# Patient Record
Sex: Male | Born: 1970
Health system: Southern US, Community
[De-identification: ages and names within clinical notes are randomized; demographics above are authoritative.]

## PROBLEM LIST (undated history)

## (undated) DIAGNOSIS — F909 Attention-deficit hyperactivity disorder, unspecified type: Secondary | ICD-10-CM

## (undated) DIAGNOSIS — I10 Essential (primary) hypertension: Secondary | ICD-10-CM

## (undated) DIAGNOSIS — K449 Diaphragmatic hernia without obstruction or gangrene: Secondary | ICD-10-CM

## (undated) DIAGNOSIS — Q5310 Unspecified undescended testicle, unilateral: Secondary | ICD-10-CM

## (undated) DIAGNOSIS — K219 Gastro-esophageal reflux disease without esophagitis: Secondary | ICD-10-CM

## (undated) DIAGNOSIS — G4730 Sleep apnea, unspecified: Secondary | ICD-10-CM

## (undated) DIAGNOSIS — D751 Secondary polycythemia: Secondary | ICD-10-CM

## (undated) DIAGNOSIS — K635 Polyp of colon: Secondary | ICD-10-CM

## (undated) DIAGNOSIS — E349 Endocrine disorder, unspecified: Secondary | ICD-10-CM

## (undated) DIAGNOSIS — D72829 Elevated white blood cell count, unspecified: Secondary | ICD-10-CM

## (undated) DIAGNOSIS — M1990 Unspecified osteoarthritis, unspecified site: Secondary | ICD-10-CM

## (undated) DIAGNOSIS — E669 Obesity, unspecified: Secondary | ICD-10-CM

## (undated) DIAGNOSIS — K2270 Barrett's esophagus without dysplasia: Secondary | ICD-10-CM

## (undated) HISTORY — DX: Barrett's esophagus without dysplasia: K22.70

## (undated) HISTORY — PX: APPENDECTOMY: SHX54

## (undated) HISTORY — PX: JOINT REPLACEMENT: SHX530

## (undated) HISTORY — DX: Obesity, unspecified: E66.9

## (undated) HISTORY — PX: BACK SURGERY: SHX140

## (undated) HISTORY — PX: ROTATOR CUFF REPAIR: SHX139

## (undated) HISTORY — DX: Polyp of colon: K63.5

## (undated) HISTORY — DX: Elevated white blood cell count, unspecified: D72.829

## (undated) HISTORY — DX: Unspecified undescended testicle, unilateral: Q53.10

## (undated) HISTORY — DX: Unspecified osteoarthritis, unspecified site: M19.90

## (undated) HISTORY — DX: Diaphragmatic hernia without obstruction or gangrene: K44.9

## (undated) HISTORY — PX: KNEE SURGERY: SHX244

## (undated) HISTORY — DX: Endocrine disorder, unspecified: E34.9

## (undated) HISTORY — DX: Secondary polycythemia: D75.1

## (undated) HISTORY — PX: CHOLECYSTECTOMY: SHX55

## (undated) HISTORY — PX: EYE SURGERY: SHX253

---

## 2004-04-13 ENCOUNTER — Ambulatory Visit: Payer: Self-pay | Admitting: Internal Medicine

## 2004-05-14 ENCOUNTER — Ambulatory Visit: Payer: Self-pay | Admitting: Internal Medicine

## 2004-06-13 ENCOUNTER — Ambulatory Visit: Payer: Self-pay | Admitting: Internal Medicine

## 2004-07-14 ENCOUNTER — Ambulatory Visit: Payer: Self-pay | Admitting: Internal Medicine

## 2004-08-23 ENCOUNTER — Ambulatory Visit: Payer: Self-pay | Admitting: Internal Medicine

## 2004-09-11 ENCOUNTER — Ambulatory Visit: Payer: Self-pay | Admitting: Internal Medicine

## 2004-10-18 ENCOUNTER — Ambulatory Visit: Payer: Self-pay | Admitting: Internal Medicine

## 2004-11-11 ENCOUNTER — Ambulatory Visit: Payer: Self-pay | Admitting: Internal Medicine

## 2004-12-12 ENCOUNTER — Ambulatory Visit: Payer: Self-pay | Admitting: Internal Medicine

## 2005-01-11 ENCOUNTER — Ambulatory Visit: Payer: Self-pay | Admitting: Internal Medicine

## 2005-02-11 ENCOUNTER — Ambulatory Visit: Payer: Self-pay | Admitting: Internal Medicine

## 2005-03-14 ENCOUNTER — Ambulatory Visit: Payer: Self-pay | Admitting: Internal Medicine

## 2005-04-18 ENCOUNTER — Ambulatory Visit: Payer: Self-pay | Admitting: Internal Medicine

## 2005-05-14 ENCOUNTER — Ambulatory Visit: Payer: Self-pay | Admitting: Internal Medicine

## 2005-06-20 ENCOUNTER — Ambulatory Visit: Payer: Self-pay | Admitting: Internal Medicine

## 2005-07-14 ENCOUNTER — Ambulatory Visit: Payer: Self-pay | Admitting: Internal Medicine

## 2005-08-21 ENCOUNTER — Ambulatory Visit: Payer: Self-pay | Admitting: Internal Medicine

## 2005-09-11 ENCOUNTER — Ambulatory Visit: Payer: Self-pay | Admitting: Internal Medicine

## 2005-10-12 ENCOUNTER — Ambulatory Visit: Payer: Self-pay | Admitting: Internal Medicine

## 2005-11-11 ENCOUNTER — Ambulatory Visit: Payer: Self-pay | Admitting: Internal Medicine

## 2005-12-15 ENCOUNTER — Ambulatory Visit: Payer: Self-pay | Admitting: General Practice

## 2005-12-22 ENCOUNTER — Ambulatory Visit: Payer: Self-pay | Admitting: Internal Medicine

## 2006-01-02 ENCOUNTER — Ambulatory Visit: Payer: Self-pay | Admitting: Unknown Physician Specialty

## 2006-01-11 ENCOUNTER — Ambulatory Visit: Payer: Self-pay | Admitting: Internal Medicine

## 2006-03-13 ENCOUNTER — Ambulatory Visit: Payer: Self-pay | Admitting: Internal Medicine

## 2006-03-14 ENCOUNTER — Ambulatory Visit: Payer: Self-pay | Admitting: Internal Medicine

## 2006-04-13 ENCOUNTER — Ambulatory Visit: Payer: Self-pay | Admitting: Internal Medicine

## 2006-07-16 ENCOUNTER — Ambulatory Visit: Payer: Self-pay | Admitting: Internal Medicine

## 2006-08-14 ENCOUNTER — Ambulatory Visit: Payer: Self-pay | Admitting: Internal Medicine

## 2006-09-12 ENCOUNTER — Ambulatory Visit: Payer: Self-pay | Admitting: Internal Medicine

## 2006-12-14 ENCOUNTER — Ambulatory Visit: Payer: Self-pay | Admitting: Internal Medicine

## 2007-01-12 ENCOUNTER — Ambulatory Visit: Payer: Self-pay | Admitting: Internal Medicine

## 2007-04-21 ENCOUNTER — Ambulatory Visit: Payer: Self-pay

## 2007-07-20 ENCOUNTER — Ambulatory Visit: Payer: Self-pay | Admitting: Internal Medicine

## 2007-08-15 ENCOUNTER — Ambulatory Visit: Payer: Self-pay | Admitting: Internal Medicine

## 2007-11-23 ENCOUNTER — Ambulatory Visit: Payer: Self-pay | Admitting: Oncology

## 2007-12-13 ENCOUNTER — Ambulatory Visit: Payer: Self-pay | Admitting: Oncology

## 2008-12-08 ENCOUNTER — Ambulatory Visit: Payer: Self-pay | Admitting: Internal Medicine

## 2008-12-12 ENCOUNTER — Ambulatory Visit: Payer: Self-pay | Admitting: Internal Medicine

## 2009-08-06 ENCOUNTER — Ambulatory Visit: Payer: Self-pay | Admitting: General Practice

## 2009-08-13 ENCOUNTER — Ambulatory Visit: Payer: Self-pay | Admitting: General Practice

## 2010-04-02 ENCOUNTER — Ambulatory Visit: Payer: Self-pay | Admitting: Internal Medicine

## 2010-04-12 ENCOUNTER — Ambulatory Visit: Payer: Self-pay | Admitting: Internal Medicine

## 2010-04-13 ENCOUNTER — Ambulatory Visit: Payer: Self-pay | Admitting: Internal Medicine

## 2010-05-23 ENCOUNTER — Ambulatory Visit: Payer: Self-pay | Admitting: General Practice

## 2010-06-13 HISTORY — PX: COLONOSCOPY: SHX174

## 2010-06-28 ENCOUNTER — Ambulatory Visit: Payer: Self-pay | Admitting: Unknown Physician Specialty

## 2010-07-01 LAB — PATHOLOGY REPORT

## 2010-09-08 ENCOUNTER — Emergency Department: Payer: Self-pay | Admitting: Unknown Physician Specialty

## 2010-09-12 ENCOUNTER — Ambulatory Visit: Payer: Self-pay | Admitting: General Practice

## 2011-02-10 ENCOUNTER — Ambulatory Visit: Payer: Self-pay | Admitting: Internal Medicine

## 2011-02-10 ENCOUNTER — Ambulatory Visit: Admit: 2011-02-10 | Disposition: A | Discharge status: Home / Self Care | Payer: Self-pay | Admitting: Internal Medicine

## 2011-02-11 ENCOUNTER — Ambulatory Visit: Payer: Self-pay | Admitting: General Practice

## 2011-02-12 ENCOUNTER — Ambulatory Visit: Payer: Self-pay | Admitting: Internal Medicine

## 2011-02-12 ENCOUNTER — Ambulatory Visit: Payer: Self-pay | Admitting: General Practice

## 2011-03-15 ENCOUNTER — Ambulatory Visit: Payer: Self-pay | Admitting: General Practice

## 2011-04-29 ENCOUNTER — Ambulatory Visit: Payer: Self-pay | Admitting: General Practice

## 2011-04-30 ENCOUNTER — Ambulatory Visit: Payer: Self-pay | Admitting: Internal Medicine

## 2011-05-15 ENCOUNTER — Ambulatory Visit: Payer: Self-pay | Admitting: General Practice

## 2011-05-15 ENCOUNTER — Ambulatory Visit: Payer: Self-pay | Admitting: Internal Medicine

## 2011-06-14 ENCOUNTER — Ambulatory Visit: Payer: Self-pay | Admitting: Internal Medicine

## 2011-11-24 ENCOUNTER — Ambulatory Visit: Payer: Self-pay | Admitting: Internal Medicine

## 2011-11-24 LAB — CBC CANCER CENTER
Basophil #: 0.1 x10 3/mm (ref 0.0–0.1)
Eosinophil #: 0.2 x10 3/mm (ref 0.0–0.7)
HGB: 13.6 g/dL (ref 13.0–18.0)
Lymphocyte #: 3.1 x10 3/mm (ref 1.0–3.6)
MCH: 23 pg — ABNORMAL LOW (ref 26.0–34.0)
MCHC: 31.2 g/dL — ABNORMAL LOW (ref 32.0–36.0)
MCV: 74 fL — ABNORMAL LOW (ref 80–100)
Monocyte #: 1.2 x10 3/mm — ABNORMAL HIGH (ref 0.2–1.0)
RBC: 5.92 10*6/uL — ABNORMAL HIGH (ref 4.40–5.90)
WBC: 14.8 x10 3/mm — ABNORMAL HIGH (ref 3.8–10.6)

## 2011-12-13 ENCOUNTER — Ambulatory Visit: Payer: Self-pay | Admitting: Internal Medicine

## 2012-01-26 ENCOUNTER — Ambulatory Visit: Payer: Self-pay | Admitting: Internal Medicine

## 2012-01-26 LAB — CBC CANCER CENTER
Basophil #: 0 x10 3/mm (ref 0.0–0.1)
Eosinophil #: 0.2 x10 3/mm (ref 0.0–0.7)
Eosinophil %: 1 %
HCT: 46.3 % (ref 40.0–52.0)
Lymphocyte %: 16.8 %
MCH: 21.9 pg — ABNORMAL LOW (ref 26.0–34.0)
MCHC: 30.3 g/dL — ABNORMAL LOW (ref 32.0–36.0)
MCV: 72 fL — ABNORMAL LOW (ref 80–100)
Neutrophil #: 11.9 x10 3/mm — ABNORMAL HIGH (ref 1.4–6.5)
Neutrophil %: 75.1 %
Platelet: 353 x10 3/mm (ref 150–440)
RBC: 6.41 10*6/uL — ABNORMAL HIGH (ref 4.40–5.90)
RDW: 18.5 % — ABNORMAL HIGH (ref 11.5–14.5)
WBC: 15.8 x10 3/mm — ABNORMAL HIGH (ref 3.8–10.6)

## 2012-02-12 ENCOUNTER — Ambulatory Visit: Payer: Self-pay | Admitting: Internal Medicine

## 2012-03-30 ENCOUNTER — Ambulatory Visit: Payer: Self-pay | Admitting: General Practice

## 2012-05-24 ENCOUNTER — Ambulatory Visit: Payer: Self-pay | Admitting: Specialist

## 2012-05-28 ENCOUNTER — Ambulatory Visit: Payer: Self-pay | Admitting: Specialist

## 2012-06-13 ENCOUNTER — Ambulatory Visit: Payer: Self-pay | Admitting: Specialist

## 2012-06-28 ENCOUNTER — Ambulatory Visit: Payer: Self-pay | Admitting: General Surgery

## 2012-06-29 LAB — PATHOLOGY REPORT

## 2012-10-12 ENCOUNTER — Ambulatory Visit: Payer: Self-pay | Admitting: Internal Medicine

## 2012-11-08 ENCOUNTER — Emergency Department: Payer: Self-pay | Admitting: Emergency Medicine

## 2012-11-11 ENCOUNTER — Ambulatory Visit: Payer: Self-pay | Admitting: Internal Medicine

## 2012-12-02 ENCOUNTER — Ambulatory Visit: Payer: Self-pay | Admitting: General Practice

## 2012-12-22 DIAGNOSIS — M549 Dorsalgia, unspecified: Secondary | ICD-10-CM | POA: Insufficient documentation

## 2013-04-15 ENCOUNTER — Other Ambulatory Visit: Payer: Self-pay | Admitting: General Practice

## 2013-04-15 DIAGNOSIS — M542 Cervicalgia: Secondary | ICD-10-CM

## 2013-04-15 DIAGNOSIS — R52 Pain, unspecified: Secondary | ICD-10-CM

## 2013-04-21 ENCOUNTER — Ambulatory Visit
Admission: RE | Admit: 2013-04-21 | Discharge: 2013-04-21 | Disposition: A | Discharge status: Home / Self Care | Payer: BC Managed Care – PPO | Source: Ambulatory Visit | Attending: General Practice | Admitting: General Practice

## 2013-04-21 DIAGNOSIS — M542 Cervicalgia: Secondary | ICD-10-CM

## 2013-04-21 DIAGNOSIS — R52 Pain, unspecified: Secondary | ICD-10-CM

## 2013-04-22 ENCOUNTER — Ambulatory Visit: Payer: Self-pay | Admitting: General Practice

## 2013-04-24 ENCOUNTER — Other Ambulatory Visit: Payer: Self-pay

## 2013-04-25 ENCOUNTER — Other Ambulatory Visit: Payer: Self-pay

## 2013-08-31 ENCOUNTER — Other Ambulatory Visit: Payer: Self-pay | Admitting: Neurosurgery

## 2013-09-02 ENCOUNTER — Encounter (HOSPITAL_COMMUNITY): Payer: Self-pay | Admitting: Pharmacy Technician

## 2013-09-07 NOTE — Pre-Procedure Instructions (Signed)
Jeffrey Carlson  09/07/2013   Your procedure is scheduled on:  Tuesday September 13, 2013 at 12:00 PM.  Report to [place] Stay Entrance "A"  Admitting at 10:00 AM.  Call this number if you have problems the morning of surgery: [phone]   Remember:   Do not eat food or drink liquids after midnight.   Take these medicines the morning of surgery with A SIP OF WATER:  Esomeprazole (Nexium) Stop taking Aspirin and herbal medications. Do not take any NSAIDs ie: Ibuprofen, Advil, Naproxen or any medication containing Aspirin.  Do not wear jewelry.  Do not wear lotions, powders, or colognes. You may wear deodorant.  Men may shave face and neck.  Do not bring valuables to the hospital.  [facility] is not responsible for any belongings or valuables.               Contacts, dentures or bridgework may not be worn into surgery.  Leave suitcase in the car. After surgery it may be brought to your room.  For patients admitted to the hospital, discharge time is determined by your treatment team.               Patients discharged the day of surgery will not be allowed to drive home.  Name and phone number of your driver: Family/Friend  Special Instructions: Shower using CHG soap the night before and the morning of your surgery   Please read over the following fact sheets that you were given: Pain Booklet, Coughing and Deep Breathing, MRSA Information and Surgical Site Infection Prevention

## 2013-09-08 ENCOUNTER — Inpatient Hospital Stay (HOSPITAL_COMMUNITY)
Admission: RE | Admit: 2013-09-08 | Discharge: 2013-09-08 | Disposition: A | Discharge status: Home / Self Care | Payer: BC Managed Care – PPO | Source: Ambulatory Visit

## 2013-09-08 NOTE — Pre-Procedure Instructions (Signed)
Jeffrey Carlson  09/08/2013   Your procedure is scheduled on:  Tuesday September 13, 2013 at 12:00 PM.  Report to [place] Stay Entrance "A"  Admitting at 10:00 AM.  Call this number if you have problems the morning of surgery: [phone]   Remember:   Do not eat food or drink liquids after midnight.   Take these medicines the morning of surgery with A SIP OF WATER: amphetamine-dextroamphetamine (ADDERALL XR) 20 MG 24 hr capsule,  Esomeprazole (Nexium) Stop taking Aspirin and herbal medications. Do not take any NSAIDs ie: Ibuprofen, Advil, Naproxen or any medication containing Aspirin.  Do not wear jewelry.  Do not wear lotions, powders, or colognes. You may wear deodorant.  Men may shave face and neck.  Do not bring valuables to the hospital.  [facility] is not responsible for any belongings or valuables.               Contacts, dentures or bridgework may not be worn into surgery.  Leave suitcase in the car. After surgery it may be brought to your room.  For patients admitted to the hospital, discharge time is determined by your treatment team.               Patients discharged the day of surgery will not be allowed to drive home.  Name and phone number of your driver: Family/Friend  Special Instructions: Shower using CHG soap the night before and the morning of your surgery   Please read over the following fact sheets that you were given: Pain Booklet, Coughing and Deep Breathing, MRSA Information and Surgical Site Infection Prevention

## 2013-09-09 ENCOUNTER — Encounter (HOSPITAL_COMMUNITY)
Admission: RE | Admit: 2013-09-09 | Discharge: 2013-09-09 | Disposition: A | Discharge status: Home / Self Care | Payer: BC Managed Care – PPO | Source: Ambulatory Visit | Attending: Neurosurgery | Admitting: Neurosurgery

## 2013-09-09 ENCOUNTER — Encounter (HOSPITAL_COMMUNITY): Payer: Self-pay

## 2013-09-09 DIAGNOSIS — Z01812 Encounter for preprocedural laboratory examination: Secondary | ICD-10-CM | POA: Insufficient documentation

## 2013-09-09 DIAGNOSIS — Z01810 Encounter for preprocedural cardiovascular examination: Secondary | ICD-10-CM | POA: Insufficient documentation

## 2013-09-09 DIAGNOSIS — Z01818 Encounter for other preprocedural examination: Secondary | ICD-10-CM | POA: Insufficient documentation

## 2013-09-09 HISTORY — DX: Essential (primary) hypertension: I10

## 2013-09-09 HISTORY — DX: Attention-deficit hyperactivity disorder, unspecified type: F90.9

## 2013-09-09 HISTORY — DX: Sleep apnea, unspecified: G47.30

## 2013-09-09 HISTORY — DX: Gastro-esophageal reflux disease without esophagitis: K21.9

## 2013-09-09 LAB — CBC
HCT: 46.7 % (ref 39.0–52.0)
Hemoglobin: 15.6 g/dL (ref 13.0–17.0)
MCH: 26.9 pg (ref 26.0–34.0)
MCHC: 33.4 g/dL (ref 30.0–36.0)
MCV: 80.4 fL (ref 78.0–100.0)
PLATELETS: 293 10*3/uL (ref 150–400)
RBC: 5.81 MIL/uL (ref 4.22–5.81)
RDW: 17.9 % — AB (ref 11.5–15.5)
WBC: 11.6 10*3/uL — ABNORMAL HIGH (ref 4.0–10.5)

## 2013-09-09 LAB — BASIC METABOLIC PANEL
BUN: 13 mg/dL (ref 6–23)
CALCIUM: 9.7 mg/dL (ref 8.4–10.5)
CO2: 26 mEq/L (ref 19–32)
CREATININE: 0.96 mg/dL (ref 0.50–1.35)
Chloride: 103 mEq/L (ref 96–112)
GLUCOSE: 89 mg/dL (ref 70–99)
POTASSIUM: 4.8 meq/L (ref 3.7–5.3)
Sodium: 140 mEq/L (ref 137–147)

## 2013-09-09 LAB — SURGICAL PCR SCREEN
MRSA, PCR: NEGATIVE
Staphylococcus aureus: NEGATIVE

## 2013-09-13 ENCOUNTER — Encounter (HOSPITAL_COMMUNITY): Admission: RE | Payer: Self-pay | Source: Ambulatory Visit

## 2013-09-13 ENCOUNTER — Ambulatory Visit (HOSPITAL_COMMUNITY): Admission: RE | Admit: 2013-09-13 | Payer: BC Managed Care – PPO | Source: Ambulatory Visit | Admitting: Neurosurgery

## 2013-09-13 ENCOUNTER — Ambulatory Visit: Payer: Self-pay | Admitting: General Practice

## 2013-09-13 SURGERY — LUMBAR LAMINECTOMY/DECOMPRESSION MICRODISCECTOMY 2 LEVELS
Anesthesia: General | Site: Back

## 2013-10-10 DIAGNOSIS — M75100 Unspecified rotator cuff tear or rupture of unspecified shoulder, not specified as traumatic: Secondary | ICD-10-CM | POA: Insufficient documentation

## 2013-10-10 DIAGNOSIS — M67919 Unspecified disorder of synovium and tendon, unspecified shoulder: Secondary | ICD-10-CM

## 2013-12-13 ENCOUNTER — Ambulatory Visit (INDEPENDENT_AMBULATORY_CARE_PROVIDER_SITE_OTHER): Payer: BC Managed Care – PPO

## 2013-12-13 ENCOUNTER — Encounter: Payer: Self-pay | Admitting: Podiatry

## 2013-12-13 ENCOUNTER — Ambulatory Visit (INDEPENDENT_AMBULATORY_CARE_PROVIDER_SITE_OTHER): Payer: BC Managed Care – PPO | Admitting: Podiatry

## 2013-12-13 VITALS — BP 118/74 | HR 59 | Resp 16 | Ht 73.0 in | Wt 305.0 lb

## 2013-12-13 DIAGNOSIS — M722 Plantar fascial fibromatosis: Secondary | ICD-10-CM

## 2013-12-14 NOTE — Progress Notes (Signed)
Subjective:     Patient ID: Jeffrey Carlson, male   DOB: February 03, 1971, 43 y.o.   MRN: [id number]  HPI patient presents stating that he has lost a lot of weight and needs to be active in his feet hurt and the orthotics are starting to fall apart   Review of Systems     Objective:   Physical Exam Neurovascular status intact with mild discomfort plantar fascia right with moderate depression of the arch on weightbearing.    Assessment:     Fasciitis secondary to foot structural and still carries quite a bit of excessive weight    Plan:     Scanned for new custom orthotic devices and reviewed applying new top covers to existing when we receive the new devices

## 2013-12-22 ENCOUNTER — Encounter: Payer: Self-pay | Admitting: Podiatry

## 2013-12-29 ENCOUNTER — Ambulatory Visit: Payer: Self-pay | Admitting: Unknown Physician Specialty

## 2013-12-29 DIAGNOSIS — M79609 Pain in unspecified limb: Secondary | ICD-10-CM

## 2013-12-30 DIAGNOSIS — M722 Plantar fascial fibromatosis: Secondary | ICD-10-CM

## 2014-01-02 LAB — PATHOLOGY REPORT

## 2014-01-16 ENCOUNTER — Encounter: Payer: Self-pay | Admitting: Podiatry

## 2014-06-23 ENCOUNTER — Ambulatory Visit: Payer: Self-pay | Admitting: General Practice

## 2014-10-31 NOTE — Op Note (Signed)
PATIENT NAME:  Jeffrey Carlson, Jeffrey Carlson MR#:  [id number] DATE OF BIRTH:  1970-08-25  DATE OF PROCEDURE:  06/28/2012  PREOPERATIVE DIAGNOSIS:  Chronic cholecystitis.   POSTOPERATIVE DIAGNOSIS:  Chronic cholecystitis.   OPERATIVE PROCEDURE:  Laparoscopic cholecystectomy with intraoperative cholangiograms.   OPERATING SURGEON:  Hervey Ard, MD   ANESTHESIA:  Elyse Hsu, MD   ESTIMATED BLOOD LOSS:  Less than 3 mL.   CLINICAL NOTE:  This 44 year old male has lost 50 pounds in preparation for bariatric surgery. Ultrasound shows evidence of sludge. No stones were identified. He has had recent symptoms of postprandial discomfort after spicy [place] food. His bariatric surgeon requested elective cholecystectomy prior to planned gastric bypass.   OPERATIVE NOTE:  With the patient under adequate general endotracheal anesthesia, the abdomen was prepped with ChloraPrep and draped. Hair had previously been removed with clippers. He had DVT prevention with pneumatic compression stockings. The patient was placed in Trendelenburg position and a Veress needle placed through a transumbilical incision. After assuring intra-abdominal location with the hanging drop test, the abdomen was insufflated with CO2 at initially 12 and then raised to 15 mmHg. Inspection showed no evidence of injury after expansion of a 10 mm step port. The patient was placed into the reverse Trendelenburg position and rolled to the left. An 11 mm Xcel port was placed in the epigastrium and two 5 mm step ports placed laterally. The gallbladder showed duskiness and thickening near the neck of the gallbladder. It was placed on cephalad traction. The cystic duct was identified. Fluoroscopic cholangiograms were completed using 15 mL of one-half strength Conray 60. This showed very small ducts. There was no evidence of retained stone or other ductal abnormality. The cystic duct and cystic arteries were doubly clipped and divided adjacent to the gallbladder.  The gallbladder was then removed from the liver bed making use of hook cautery dissection. It was delivered to the umbilical port site. Inspection from the epigastric site showed no evidence of injury from initial port placement. After reestablishing pneumoperitoneum, inspection of the right upper quadrant showed it to be dry. The abdomen was then desufflated under direct vision and the ports were removed. The fascia at the umbilicus was closed with a single 0 Vicryl figure-of-eight suture. Skin incisions were closed with 4-0 Vicryl subcuticular sutures. Benzoin, Steri-Strips, Telfa and Tegaderm dressings were applied.   The patient tolerated the procedure well and was taken to the recovery room in stable condition.     ____________________________ Robert Bellow, MD jwb:es D: 06/28/2012 12:28:03 ET T: 06/29/2012 11:14:55 ET JOB#: [id number]  cc: Robert Bellow, MD, <Dictator> Nelda Severe. Burt Ek, MD Kreg Shropshire, MD Vincente Asbridge Amedeo Kinsman MD ELECTRONICALLY SIGNED 06/29/2012 13:54

## 2014-12-20 ENCOUNTER — Inpatient Hospital Stay: Payer: BLUE CROSS/BLUE SHIELD

## 2014-12-20 ENCOUNTER — Inpatient Hospital Stay: Payer: BLUE CROSS/BLUE SHIELD | Attending: Family Medicine | Admitting: Family Medicine

## 2014-12-20 ENCOUNTER — Ambulatory Visit: Payer: BLUE CROSS/BLUE SHIELD

## 2014-12-20 ENCOUNTER — Encounter (INDEPENDENT_AMBULATORY_CARE_PROVIDER_SITE_OTHER): Payer: Self-pay

## 2014-12-20 ENCOUNTER — Encounter: Payer: Self-pay | Admitting: Family Medicine

## 2014-12-20 ENCOUNTER — Other Ambulatory Visit: Payer: Self-pay | Admitting: Family Medicine

## 2014-12-20 VITALS — BP 116/80 | HR 67 | Temp 97.4°F | Resp 16 | Wt 339.5 lb

## 2014-12-20 DIAGNOSIS — D751 Secondary polycythemia: Secondary | ICD-10-CM | POA: Insufficient documentation

## 2014-12-20 DIAGNOSIS — K219 Gastro-esophageal reflux disease without esophagitis: Secondary | ICD-10-CM | POA: Insufficient documentation

## 2014-12-20 DIAGNOSIS — Z79899 Other long term (current) drug therapy: Secondary | ICD-10-CM | POA: Insufficient documentation

## 2014-12-20 DIAGNOSIS — G4730 Sleep apnea, unspecified: Secondary | ICD-10-CM | POA: Diagnosis not present

## 2014-12-20 DIAGNOSIS — Z9049 Acquired absence of other specified parts of digestive tract: Secondary | ICD-10-CM | POA: Diagnosis not present

## 2014-12-20 DIAGNOSIS — D45 Polycythemia vera: Secondary | ICD-10-CM | POA: Diagnosis not present

## 2014-12-20 DIAGNOSIS — R531 Weakness: Secondary | ICD-10-CM | POA: Diagnosis not present

## 2014-12-20 DIAGNOSIS — F909 Attention-deficit hyperactivity disorder, unspecified type: Secondary | ICD-10-CM | POA: Insufficient documentation

## 2014-12-20 DIAGNOSIS — R5383 Other fatigue: Secondary | ICD-10-CM | POA: Insufficient documentation

## 2014-12-20 DIAGNOSIS — I10 Essential (primary) hypertension: Secondary | ICD-10-CM | POA: Diagnosis not present

## 2014-12-20 HISTORY — DX: Secondary polycythemia: D75.1

## 2014-12-20 LAB — HEMATOCRIT: HCT: 50.1 % (ref 40.0–52.0)

## 2014-12-20 NOTE — Progress Notes (Signed)
[place]  Telephone:([phone]) [phone]  Fax:([phone]     Jeffrey Carlson DOB: 1970/09/10  MR#: [id number]  EXH#:[id number]  Patient Care Team: Shepard General, MD as PCP - General (General Practice)  CHIEF COMPLAINT:  Chief Complaint  Patient presents with  . Follow-up    States that he's back again for a " follow up."    INTERVAL HISTORY:  Patient is here for continued follow-up and further evaluation polycythemia vera.. He has been lost to follow-up since 2014. Was a previous patient of Dr. Inez Pilgrim. He reports that he did not return for follow-up due to feeling well and he has recently started feeling poorly again, including fatigue.  REVIEW OF SYSTEMS:   Review of Systems  Constitutional: Positive for malaise/fatigue. Negative for fever, chills, weight loss and diaphoresis.  HENT: Negative for congestion, ear discharge, ear pain, hearing loss, nosebleeds, sore throat and tinnitus.   Eyes: Negative for blurred vision, double vision, photophobia, pain, discharge and redness.  Respiratory: Negative for cough, hemoptysis, sputum production, shortness of breath, wheezing and stridor.   Cardiovascular: Negative for chest pain, palpitations, orthopnea, claudication, leg swelling and PND.  Gastrointestinal: Negative for heartburn, nausea, vomiting, abdominal pain, diarrhea, constipation, blood in stool and melena.  Genitourinary: Negative.   Musculoskeletal: Negative.   Skin: Negative.   Neurological: Negative for dizziness, tingling, focal weakness, seizures, weakness and headaches.  Endo/Heme/Allergies: Does not bruise/bleed easily.  Psychiatric/Behavioral: Negative for depression. The patient is not nervous/anxious and does not have insomnia.     As per HPI. Otherwise, a complete review of systems is negatve.  ONCOLOGY HISTORY:  No history exists.    PAST MEDICAL HISTORY: Past Medical History  Diagnosis Date  . GERD (gastroesophageal reflux disease)   .  Adult ADHD   . Hypertension     dr Burt Ek   [place]  . Sleep apnea     cpap  . Polycythemia, secondary 12/20/2014    PAST SURGICAL HISTORY: Past Surgical History  Procedure Laterality Date  . Joint replacement      rt shoulder  . Appendectomy    . Eye surgery    . Cholecystectomy      FAMILY HISTORY No family history on file.  GYNECOLOGIC HISTORY:  No LMP for male patient.     ADVANCED DIRECTIVES:    HEALTH MAINTENANCE: History  Substance Use Topics  . Smoking status: Never Smoker   . Smokeless tobacco: Not on file  . Alcohol Use: Yes     Comment: occ     Colonoscopy:  PAP:  Bone density:  Lipid panel:  No Known Allergies  Current Outpatient Prescriptions  Medication Sig Dispense Refill  . amphetamine-dextroamphetamine (ADDERALL XR) 20 MG 24 hr capsule Take 20 mg by mouth daily.    [name] esomeprazole (NEXIUM) 40 MG capsule Take 40 mg by mouth daily at 12 noon.    [name] lisinopril (PRINIVIL,ZESTRIL) 20 MG tablet Take 20 mg by mouth daily.    . meloxicam (MOBIC) 15 MG tablet Take 15 mg by mouth daily.    [name] testosterone cypionate (DEPOTESTOTERONE CYPIONATE) 200 MG/ML injection See admin instructions.  0   No current facility-administered medications for this visit.    OBJECTIVE: BP 116/80 mmHg  Pulse 67  Temp(Src) 97.4 F (36.3 C) (Tympanic)  Resp 16  Wt 339 lb 8.1 oz (154 kg)  SpO2 98%   Body mass index is 44.8 kg/(m^2).    ECOG FS:1 - Symptomatic but completely ambulatory  General: Well-developed, well-nourished,  no acute distress. Eyes: Pink conjunctiva, anicteric sclera. HEENT: Normocephalic, moist mucous membranes, clear oropharnyx. Lungs: Clear to auscultation bilaterally. Heart: Regular rate and rhythm. No rubs, murmurs, or gallops. Abdomen: Soft, nontender, nondistended. No organomegaly noted, normoactive bowel sounds. Musculoskeletal: No edema, cyanosis, or clubbing. Neuro: Alert, answering all questions appropriately. Cranial nerves grossly  intact. Skin: No rashes or petechiae noted. Psych: Normal affect.    LAB RESULTS:     Component Value Date/Time   NA 140 09/09/2013 0833   K 4.8 09/09/2013 0833   CL 103 09/09/2013 0833   CO2 26 09/09/2013 0833   GLUCOSE 89 09/09/2013 0833   BUN 13 09/09/2013 0833   CREATININE 0.96 09/09/2013 0833   CALCIUM 9.7 09/09/2013 0833   GFRNONAA >90 09/09/2013 0833   GFRAA >90 09/09/2013 0833    No results found for: SPEP, UPEP  Lab Results  Component Value Date   WBC 11.6* 09/09/2013   NEUTROABS 11.9* 01/26/2012   HGB 15.6 09/09/2013   HCT 50.1 12/20/2014   MCV 80.4 09/09/2013   PLT 293 09/09/2013      Chemistry      Component Value Date/Time   NA 140 09/09/2013 0833   K 4.8 09/09/2013 0833   CL 103 09/09/2013 0833   CO2 26 09/09/2013 0833   BUN 13 09/09/2013 0833   CREATININE 0.96 09/09/2013 0833      Component Value Date/Time   CALCIUM 9.7 09/09/2013 0833       No results found for: LABCA2  No components found for: [id number]  No results for input(s): INR in the last 168 hours.  No results found for: COLORURINE, APPEARANCEUR, LABSPEC, PHURINE, GLUCOSEU, HGBUR, BILIRUBINUR, KETONESUR, PROTEINUR, UROBILINOGEN, NITRITE, LEUKOCYTESUR  STUDIES: No results found.  ASSESSMENT:  Polycythemia vera, previously established target hematocrit of 44 or less  PLAN:   1. PV. Patient's hematocrit today is reported as 50. Patient has been lost to follow-up since 2014. Target hematocrit is 44 per previously established plan of care with Dr. Inez Pilgrim. We'll proceed with a 300 mL phlebotomy. Patient encouraged to come back monthly for continued evaluation of hematocrits with possible phlebotomies. Will need follow-up with M.D. in 4 months.  Patient expressed understanding and was in agreement with this plan. He also understands that He can call clinic at any time with any questions, concerns, or complaints.    Dr. Grayland Ormond was available for consultation and review of plan of  care for this patient.   Evlyn Kanner, NP   01/08/2015 10:52 AM

## 2014-12-22 ENCOUNTER — Inpatient Hospital Stay: Payer: BLUE CROSS/BLUE SHIELD

## 2015-01-18 DIAGNOSIS — R0789 Other chest pain: Secondary | ICD-10-CM | POA: Insufficient documentation

## 2015-01-18 DIAGNOSIS — R0602 Shortness of breath: Secondary | ICD-10-CM | POA: Insufficient documentation

## 2015-01-18 DIAGNOSIS — I4510 Unspecified right bundle-branch block: Secondary | ICD-10-CM | POA: Insufficient documentation

## 2015-01-19 ENCOUNTER — Inpatient Hospital Stay: Payer: BLUE CROSS/BLUE SHIELD

## 2015-01-22 ENCOUNTER — Inpatient Hospital Stay: Payer: BLUE CROSS/BLUE SHIELD | Attending: Family Medicine

## 2015-01-22 ENCOUNTER — Inpatient Hospital Stay: Payer: BLUE CROSS/BLUE SHIELD

## 2015-01-22 DIAGNOSIS — D751 Secondary polycythemia: Secondary | ICD-10-CM | POA: Diagnosis present

## 2015-01-22 LAB — HEMATOCRIT: HCT: 50.6 % (ref 40.0–52.0)

## 2015-02-19 ENCOUNTER — Inpatient Hospital Stay: Payer: BLUE CROSS/BLUE SHIELD | Attending: Family Medicine

## 2015-02-19 ENCOUNTER — Inpatient Hospital Stay: Payer: BLUE CROSS/BLUE SHIELD

## 2015-02-19 VITALS — BP 130/87 | HR 74 | Temp 97.0°F | Resp 20

## 2015-02-19 DIAGNOSIS — D751 Secondary polycythemia: Secondary | ICD-10-CM | POA: Diagnosis present

## 2015-02-19 LAB — HEMATOCRIT: HCT: 47.3 % (ref 40.0–52.0)

## 2015-02-20 ENCOUNTER — Other Ambulatory Visit: Payer: Self-pay | Admitting: Family Medicine

## 2015-03-22 ENCOUNTER — Inpatient Hospital Stay: Payer: BLUE CROSS/BLUE SHIELD | Attending: Family Medicine

## 2015-03-22 ENCOUNTER — Inpatient Hospital Stay: Payer: BLUE CROSS/BLUE SHIELD

## 2015-04-17 ENCOUNTER — Encounter: Payer: Self-pay | Admitting: *Deleted

## 2015-04-19 ENCOUNTER — Ambulatory Visit: Payer: BLUE CROSS/BLUE SHIELD

## 2015-04-19 ENCOUNTER — Inpatient Hospital Stay: Payer: BLUE CROSS/BLUE SHIELD | Attending: Internal Medicine

## 2015-04-19 ENCOUNTER — Inpatient Hospital Stay: Payer: BLUE CROSS/BLUE SHIELD

## 2015-04-19 ENCOUNTER — Ambulatory Visit: Payer: BLUE CROSS/BLUE SHIELD | Admitting: Family Medicine

## 2015-04-19 ENCOUNTER — Inpatient Hospital Stay (HOSPITAL_BASED_OUTPATIENT_CLINIC_OR_DEPARTMENT_OTHER): Payer: BLUE CROSS/BLUE SHIELD | Admitting: Internal Medicine

## 2015-04-19 ENCOUNTER — Encounter: Payer: Self-pay | Admitting: Internal Medicine

## 2015-04-19 VITALS — BP 135/84 | HR 77 | Temp 97.8°F | Resp 18 | Ht 73.0 in | Wt 341.7 lb

## 2015-04-19 VITALS — BP 127/82 | HR 80 | Resp 18

## 2015-04-19 DIAGNOSIS — E669 Obesity, unspecified: Secondary | ICD-10-CM | POA: Insufficient documentation

## 2015-04-19 DIAGNOSIS — I10 Essential (primary) hypertension: Secondary | ICD-10-CM | POA: Diagnosis not present

## 2015-04-19 DIAGNOSIS — K219 Gastro-esophageal reflux disease without esophagitis: Secondary | ICD-10-CM

## 2015-04-19 DIAGNOSIS — M1990 Unspecified osteoarthritis, unspecified site: Secondary | ICD-10-CM | POA: Insufficient documentation

## 2015-04-19 DIAGNOSIS — E291 Testicular hypofunction: Secondary | ICD-10-CM | POA: Diagnosis not present

## 2015-04-19 DIAGNOSIS — G4730 Sleep apnea, unspecified: Secondary | ICD-10-CM

## 2015-04-19 DIAGNOSIS — Z7952 Long term (current) use of systemic steroids: Secondary | ICD-10-CM | POA: Diagnosis not present

## 2015-04-19 DIAGNOSIS — Z800 Family history of malignant neoplasm of digestive organs: Secondary | ICD-10-CM | POA: Diagnosis not present

## 2015-04-19 DIAGNOSIS — K449 Diaphragmatic hernia without obstruction or gangrene: Secondary | ICD-10-CM | POA: Diagnosis not present

## 2015-04-19 DIAGNOSIS — K2270 Barrett's esophagus without dysplasia: Secondary | ICD-10-CM | POA: Diagnosis not present

## 2015-04-19 DIAGNOSIS — Q539 Undescended testicle, unspecified: Secondary | ICD-10-CM

## 2015-04-19 DIAGNOSIS — D72829 Elevated white blood cell count, unspecified: Secondary | ICD-10-CM | POA: Diagnosis not present

## 2015-04-19 DIAGNOSIS — M129 Arthropathy, unspecified: Secondary | ICD-10-CM

## 2015-04-19 DIAGNOSIS — Z86010 Personal history of colonic polyps: Secondary | ICD-10-CM

## 2015-04-19 DIAGNOSIS — D751 Secondary polycythemia: Secondary | ICD-10-CM | POA: Diagnosis not present

## 2015-04-19 DIAGNOSIS — F909 Attention-deficit hyperactivity disorder, unspecified type: Secondary | ICD-10-CM | POA: Insufficient documentation

## 2015-04-19 DIAGNOSIS — Z79899 Other long term (current) drug therapy: Secondary | ICD-10-CM | POA: Insufficient documentation

## 2015-04-19 DIAGNOSIS — Z809 Family history of malignant neoplasm, unspecified: Secondary | ICD-10-CM

## 2015-04-19 LAB — HEMATOCRIT: HCT: 47.2 % (ref 40.0–52.0)

## 2015-04-19 NOTE — Progress Notes (Signed)
Pt doing well no c/o. Here today to decide if he needs phlebotomy.  He does have poison ivy and he is on prednisone.

## 2015-04-19 NOTE — Progress Notes (Signed)
[facility] OFFICE PROGRESS NOTE  Patient Care Team: Shepard General, MD as PCP - General (General Practice)   SUMMARY OF HEMATOLOGIC/ONCOLOGIC HISTORY:  # 2004- SECONDARY POLYCYTHEMIA- sec to testosterone maintenanc/ OSA; Bcr-Abl FISH NEG [2003]; JAK 2 [V617] & Exon -12- NEG  # Hypotestosteronism [0601]  INTERVAL HISTORY:  A very pleasant 44 year old male patient with above history of likely secondary erythrocytosis is here for follow-up. Patient denies any strokelike symptoms denies any headaches or changes or any discoloration of fingertips and toes.  He had a phlebotomy approximately 2 months ago; he does not feel any different/ significantly better after the phlebotomy.  REVIEW OF SYSTEMS:  A complete 10 point review of system is done which is negative except mentioned above/history of present illness.   PAST MEDICAL HISTORY :  Past Medical History  Diagnosis Date  . GERD (gastroesophageal reflux disease)   . Adult ADHD   . Hypertension     dr Burt Ek   [place]  . Sleep apnea     cpap  . Polycythemia, secondary 12/20/2014  . Obesity   . Colon polyps   . Barrett esophagus   . Testosterone deficiency   . Hiatal hernia   . Degenerative joint disease   . Arthritis   . Undescended right testicle   . Leukocytosis     PAST SURGICAL HISTORY :   Past Surgical History  Procedure Laterality Date  . Joint replacement      rt shoulder  . Appendectomy    . Eye surgery    . Cholecystectomy    . Colonoscopy  06/2010    + small bowel capsule study  . Knee surgery      FAMILY HISTORY :   Family History  Problem Relation Age of Onset  . Colon cancer Other     grandfather  . Thyroid cancer Father     SOCIAL HISTORY:   Social History  Substance Use Topics  . Smoking status: Never Smoker   . Smokeless tobacco: Former Systems developer    Types: Chew  . Alcohol Use: 0.0 oz/week    0 Standard drinks or equivalent per week     Comment: occ    ALLERGIES:   has No Known Allergies.  MEDICATIONS:  Current Outpatient Prescriptions  Medication Sig Dispense Refill  . amphetamine-dextroamphetamine (ADDERALL XR) 20 MG 24 hr capsule Take 20 mg by mouth daily.    [name] esomeprazole (NEXIUM) 40 MG capsule Take 40 mg by mouth daily at 12 noon.    [name] lisinopril (PRINIVIL,ZESTRIL) 20 MG tablet Take 20 mg by mouth daily.    . meloxicam (MOBIC) 15 MG tablet Take 15 mg by mouth daily.    . predniSONE (DELTASONE) 10 MG tablet Take 10 mg by mouth daily with breakfast. Medrol dose pack    . testosterone cypionate (DEPOTESTOTERONE CYPIONATE) 200 MG/ML injection See admin instructions.  0   No current facility-administered medications for this visit.    PHYSICAL EXAMINATION: ECOG PERFORMANCE STATUS: 0 - Asymptomatic  BP 135/84 mmHg  Pulse 77  Temp(Src) 97.8 F (36.6 C) (Tympanic)  Resp 18  Ht 6\' 1"  (1.854 m)  Wt 341 lb 11.4 oz (155 kg)  BMI 45.09 kg/m2  Filed Weights   04/19/15 1436  Weight: 341 lb 11.4 oz (155 kg)    GENERAL: Well-nourished well-developed; Alert, no distress and comfortable.   Alone.  EYES: no pallor or icterus OROPHARYNX: no thrush or ulceration; good dentition  NECK: supple, no masses felt LYMPH:  no palpable lymphadenopathy in the cervical, axillary or inguinal regions LUNGS: clear to auscultation and  No wheeze or crackles HEART/CVS: regular rate & rhythm and no murmurs; No lower extremity edema ABDOMEN:abdomen soft, non-tender and normal bowel sounds Musculoskeletal:no cyanosis of digits and no clubbing  PSYCH: alert & oriented x 3 with fluent speech NEURO: no focal motor/sensory deficits SKIN:  no rashes or significant lesions  LABORATORY DATA:  I have reviewed the data as listed    Component Value Date/Time   NA 140 09/09/2013 0833   K 4.8 09/09/2013 0833   CL 103 09/09/2013 0833   CO2 26 09/09/2013 0833   GLUCOSE 89 09/09/2013 0833   BUN 13 09/09/2013 0833   CREATININE 0.96 09/09/2013 0833   CALCIUM 9.7 09/09/2013  0833   GFRNONAA >90 09/09/2013 0833   GFRAA >90 09/09/2013 0833    No results found for: SPEP, UPEP  Lab Results  Component Value Date   WBC 11.6* 09/09/2013   NEUTROABS 11.9* 01/26/2012   HGB 15.6 09/09/2013   HCT 47.2 04/19/2015   MCV 80.4 09/09/2013   PLT 293 09/09/2013      Chemistry      Component Value Date/Time   NA 140 09/09/2013 0833   K 4.8 09/09/2013 0833   CL 103 09/09/2013 0833   CO2 26 09/09/2013 0833   BUN 13 09/09/2013 0833   CREATININE 0.96 09/09/2013 0833      Component Value Date/Time   CALCIUM 9.7 09/09/2013 0833       RADIOGRAPHIC STUDIES: I have personally reviewed the radiological images as listed and agreed with the findings in the report. No results found.   ASSESSMENT & PLAN:   # Secondary erythrocytosis likely secondary to testosterone supplementation/history of obstructive sleep apnea on CPAP.   Long discussion the patient regarding natural history of polycythemia vera/also a secondary erythrocytosis. I would recommend keeping hematocrit less than 47. I would recommend phlebotomy 3 months or so. Also recommended aspirin 81 mg a day.  Patient agrees with the above plan; last follow-up with me in approximately 3 months.   No orders of the defined types were placed in this encounter.   All questions were answered. The patient knows to call the clinic with any problems, questions or concerns. No barriers to learning was detected. I spent 15 minutes counseling the patient face to face. The total time spent in the appointment was 30 minutes and more than 50% was on counseling and review of test results     Cammie Sickle, MD 04/19/2015 2:49 PM

## 2015-04-29 ENCOUNTER — Other Ambulatory Visit
Admission: RE | Admit: 2015-04-29 | Discharge: 2015-04-29 | Disposition: A | Discharge status: Home / Self Care | Payer: Worker's Compensation | Attending: Family Medicine | Admitting: Family Medicine

## 2015-04-29 NOTE — ED Notes (Signed)
Worker's comp urine collected per protocol and paper work filled out by [employer], ED tech; specimen taken to lab by [employer]

## 2015-05-24 ENCOUNTER — Encounter: Payer: Self-pay | Admitting: Family Medicine

## 2015-07-12 ENCOUNTER — Inpatient Hospital Stay: Payer: BLUE CROSS/BLUE SHIELD | Admitting: Family Medicine

## 2015-07-12 ENCOUNTER — Other Ambulatory Visit: Payer: BLUE CROSS/BLUE SHIELD

## 2015-07-12 ENCOUNTER — Inpatient Hospital Stay: Payer: BLUE CROSS/BLUE SHIELD

## 2015-07-12 ENCOUNTER — Inpatient Hospital Stay: Payer: BLUE CROSS/BLUE SHIELD | Attending: Internal Medicine

## 2015-07-17 ENCOUNTER — Encounter: Payer: Self-pay | Admitting: Sports Medicine

## 2015-07-17 ENCOUNTER — Ambulatory Visit (INDEPENDENT_AMBULATORY_CARE_PROVIDER_SITE_OTHER): Payer: BLUE CROSS/BLUE SHIELD | Admitting: Sports Medicine

## 2015-07-17 DIAGNOSIS — M7741 Metatarsalgia, right foot: Secondary | ICD-10-CM

## 2015-07-17 DIAGNOSIS — M7742 Metatarsalgia, left foot: Secondary | ICD-10-CM | POA: Diagnosis not present

## 2015-07-17 DIAGNOSIS — M25372 Other instability, left ankle: Secondary | ICD-10-CM | POA: Diagnosis not present

## 2015-07-17 DIAGNOSIS — M79672 Pain in left foot: Secondary | ICD-10-CM

## 2015-07-17 DIAGNOSIS — M79671 Pain in right foot: Secondary | ICD-10-CM

## 2015-07-17 DIAGNOSIS — M722 Plantar fascial fibromatosis: Secondary | ICD-10-CM

## 2015-07-17 DIAGNOSIS — L853 Xerosis cutis: Secondary | ICD-10-CM | POA: Diagnosis not present

## 2015-07-17 DIAGNOSIS — B351 Tinea unguium: Secondary | ICD-10-CM

## 2015-07-17 NOTE — Progress Notes (Signed)
Patient ID: Jeffrey Carlson, male   DOB: 01/31/1971, 45 y.o.   MRN: [id number] Subjective: Jeffrey Carlson is a 45 y.o. male patient presents to office with complaint of pain to ball of Left>right foot, left big toe nail discoloration, dry skin and left ankle feeling like it is going to roll out from underneath him. Patient also states that his orthotics need to be recovered and desires this to be done. Denies any other pedal complaints or symptoms at this time.   Patient Active Problem List   Diagnosis Date Noted  . Polycythemia, secondary 12/20/2014   Current Outpatient Prescriptions on File Prior to Visit  Medication Sig Dispense Refill  . amphetamine-dextroamphetamine (ADDERALL XR) 20 MG 24 hr capsule Take 20 mg by mouth daily.    [name] esomeprazole (NEXIUM) 40 MG capsule Take 40 mg by mouth daily at 12 noon.    [name] lisinopril (PRINIVIL,ZESTRIL) 20 MG tablet Take 20 mg by mouth daily.    . meloxicam (MOBIC) 15 MG tablet Take 15 mg by mouth daily.    . predniSONE (DELTASONE) 10 MG tablet Take 10 mg by mouth daily with breakfast. Medrol dose pack    . testosterone cypionate (DEPOTESTOTERONE CYPIONATE) 200 MG/ML injection See admin instructions.  0   No current facility-administered medications on file prior to visit.   No Known Allergies   Objective: Physical Exam General: The patient is alert and oriented x3 in no acute distress.  Dermatology: Skin is warm, dry and supple bilateral lower extremities. Nails 1-10 are mildy elongated with subungal debris and brown and green discoloration at left hallux toenail with no surrounding infection. There is no erythema, edema, no eccymosis, no open lesions present. There is diffuse dry skin plantar aspects with no signs of infection. Integument is otherwise unremarkable.  Vascular: Dorsalis Pedis pulse and Posterior Tibial pulse are 2/4 bilateral. Capillary fill time is immediate to all digits.  Neurological: Grossly intact to light touch with an  achilles reflex of +2/5 and a negative Tinel's and moulders sign bilateral.  Musculoskeletal: Mild tenderness to lesser MTPJs left>right, No Tenderness to palpation at the medial calcaneal tubercale and through the insertion of the plantar fascia bilateral. No pain with compression of calcaneus bilateral. No pain with tuning fork to calcaneus bilateral. No pain with calf compression bilateral. No pain with palpation to left ankle and no instability however subjective instability of feeling like ankle is rolling out.  All other joints range of motion within normal limits bilateral. Strength 5/5 in all groups bilateral.   Assessment and Plan: Problem List Items Addressed This Visit    None    Visit Diagnoses    Dermatophytosis of nail    -  Primary    Relevant Orders    Fungus Culture with Smear    Plantar fascial fibromatosis        Metatarsalgia of both feet        Dry skin        Foot pain, bilateral        Ankle instability, left        subjective       -Complete examination performed. Discussed with patient in detail the condition of plantar fasciitis and worn orthotics/compensation pain, how this occurs and general treatment options. Explained both conservative and surgical treatments.  -Sent orthotics to [place] lab for replacement of top cover. Added a metatarsal pad to other pair of patient's orthotics to see if this will give symptomatic relief to ball of  both feet; Advised patient may need to be re-scanned for orthotics to make a more supportive device -Recommended good supportive shoes daily -Explained and dispensed to patient daily stretching exercises. -Recommend patient to ice affected area 1-2x daily. -Recommend ankle support for subjective instability on left; patient want to get free brace from his city nurse -Recommend skin emollients daily -Fungal culture obtain of left hallux nail; to discuss treatment options based on results at next encounter  -Patient to return to  office in 4 weeks for follow up/discussion of culture results or sooner if problems or questions  [place].  Jeffrey Carlson, DPM

## 2015-07-18 ENCOUNTER — Ambulatory Visit: Payer: Self-pay | Admitting: Podiatry

## 2015-07-25 ENCOUNTER — Inpatient Hospital Stay: Payer: BLUE CROSS/BLUE SHIELD | Attending: Internal Medicine

## 2015-07-25 ENCOUNTER — Inpatient Hospital Stay: Payer: BLUE CROSS/BLUE SHIELD

## 2015-08-10 DIAGNOSIS — M722 Plantar fascial fibromatosis: Secondary | ICD-10-CM

## 2015-08-14 ENCOUNTER — Encounter: Payer: Self-pay | Admitting: Sports Medicine

## 2015-08-14 ENCOUNTER — Ambulatory Visit (INDEPENDENT_AMBULATORY_CARE_PROVIDER_SITE_OTHER): Payer: BLUE CROSS/BLUE SHIELD | Admitting: Sports Medicine

## 2015-08-14 DIAGNOSIS — M79671 Pain in right foot: Secondary | ICD-10-CM | POA: Diagnosis not present

## 2015-08-14 DIAGNOSIS — M722 Plantar fascial fibromatosis: Secondary | ICD-10-CM

## 2015-08-14 DIAGNOSIS — M79672 Pain in left foot: Secondary | ICD-10-CM

## 2015-08-14 DIAGNOSIS — M25372 Other instability, left ankle: Secondary | ICD-10-CM

## 2015-08-14 DIAGNOSIS — B351 Tinea unguium: Secondary | ICD-10-CM

## 2015-08-14 DIAGNOSIS — M7742 Metatarsalgia, left foot: Secondary | ICD-10-CM | POA: Diagnosis not present

## 2015-08-14 DIAGNOSIS — M7741 Metatarsalgia, right foot: Secondary | ICD-10-CM

## 2015-08-14 DIAGNOSIS — L853 Xerosis cutis: Secondary | ICD-10-CM | POA: Diagnosis not present

## 2015-08-14 DIAGNOSIS — Q828 Other specified congenital malformations of skin: Secondary | ICD-10-CM

## 2015-08-14 NOTE — Progress Notes (Signed)
Patient ID: JAHMIL MACLEOD, male   DOB: 08/27/1970, 45 y.o.   MRN: [id number]  Subjective: DREZDEN SEITZINGER is a 45 y.o. male patient returns to office for review of fungal culture results; Patient also continues to have pain on ball of Left>right foot, patient states that since his orthotics have been recovered that the callus at the ball of the foot feels a little better. Denies any other pedal complaints or symptoms at this time.   Patient Active Problem List   Diagnosis Date Noted  . Polycythemia, secondary 12/20/2014   Current Outpatient Prescriptions on File Prior to Visit  Medication Sig Dispense Refill  . amphetamine-dextroamphetamine (ADDERALL XR) 20 MG 24 hr capsule Take 20 mg by mouth daily.    [name] esomeprazole (NEXIUM) 40 MG capsule Take 40 mg by mouth daily at 12 noon.    [name] lisinopril (PRINIVIL,ZESTRIL) 20 MG tablet Take 20 mg by mouth daily.    . meloxicam (MOBIC) 15 MG tablet Take 15 mg by mouth daily.    . predniSONE (DELTASONE) 10 MG tablet Take 10 mg by mouth daily with breakfast. Medrol dose pack    . testosterone cypionate (DEPOTESTOTERONE CYPIONATE) 200 MG/ML injection See admin instructions.  0   No current facility-administered medications on file prior to visit.   No Known Allergies   Objective: Physical Exam General: The patient is alert and oriented x3 in no acute distress.  Dermatology: Skin is warm, dry and supple bilateral lower extremities. Nails 1-10 are mildy elongated with subungal debris and brown and green discoloration at left hallux toenail with no surrounding infection. There is no erythema, edema, no eccymosis, no open lesions present. There is diffuse dry skin plantar aspects with no signs of infection, + porokeratosis sub met 4 on left. Integument is otherwise unremarkable.  Vascular: Dorsalis Pedis pulse and Posterior Tibial pulse are 2/4 bilateral. Capillary fill time is immediate to all digits.  Neurological: Grossly intact to light touch with an  achilles reflex of +2/5 and a negative Tinel's and moulders sign bilateral.  Musculoskeletal: Mild tenderness to lesser MTPJs left>right and at keratosis site, No Tenderness to palpation at the medial calcaneal tubercale and through the insertion of the plantar fascia bilateral. No pain with compression of calcaneus bilateral. No pain with tuning fork to calcaneus bilateral. No pain with calf compression bilateral. No pain with palpation to left ankle and no instability.  All other joints range of motion within normal limits bilateral. Strength 5/5 in all groups bilateral.   Assessment and Plan: Problem List Items Addressed This Visit    None    Visit Diagnoses    Dermatophytosis of nail    -  Primary    Relevant Orders    CBC with Differential    Basic Metabolic Panel    Hepatic Function Panel    Plantar fascial fibromatosis        Metatarsalgia of both feet        Dry skin        Porokeratosis        Ankle instability, left        No acute symptoms    Foot pain, bilateral           -Complete examination performed. Discussed with patient in detail the condition of plantar fasciitis and worn orthotics/compensation pain, how this occurs and general treatment options. Explained both conservative and surgical treatments.  -Scanned patient for new pair of orthotics; Rx sent [facility] for deep heel  cup with ext heel post, full length, spenco orthotics; patient to cont with current re-covered orthotics in the meantime -Mechanically debrided porokeratotic lesion sub met 4 on right with sterile chisel blade and applied salinocaine and moleskin to site to keep intact for 1 day -Recommended good supportive shoes daily -Cont daily stretching exercises. -Recommend patient to ice affected area at ankle and plantar fascia when bothersome 1-2x daily. -Recommend ankle support for subjective instability on left; patient to get free brace from his city nurse -Recommend skin emollients daily -Fungal  culture +, ordered labs to start Lamisil once reviewed; patient will have city nurse obtain and send labwork for review -Patient to return to office in 6 weeks for follow up/PUO/repeat labs after start on Lamisil or sooner if problems or questions arise.  [name], DPM

## 2015-08-15 DIAGNOSIS — M722 Plantar fascial fibromatosis: Secondary | ICD-10-CM

## 2015-08-28 ENCOUNTER — Telehealth: Payer: Self-pay | Admitting: Sports Medicine

## 2015-08-28 DIAGNOSIS — B351 Tinea unguium: Secondary | ICD-10-CM

## 2015-08-28 MED ORDER — TERBINAFINE HCL 250 MG PO TABS: 250.0000 mg | ORAL_TABLET | Freq: Every day | ORAL | Status: DC

## 2015-08-28 NOTE — Telephone Encounter (Signed)
Reviewed lab work from [facility] ordered by Dr. Rosanna Randy. All within normal limits; Hepatic Panel Normal. Ordered Lamisil 250 mg PO daily for nail fungus. -Dr. Cannon Kettle

## 2015-09-25 ENCOUNTER — Ambulatory Visit: Payer: BLUE CROSS/BLUE SHIELD | Admitting: Sports Medicine

## 2015-09-28 ENCOUNTER — Ambulatory Visit: Payer: BLUE CROSS/BLUE SHIELD | Admitting: Sports Medicine

## 2015-10-18 ENCOUNTER — Other Ambulatory Visit: Payer: Self-pay | Admitting: *Deleted

## 2015-10-18 DIAGNOSIS — D751 Secondary polycythemia: Secondary | ICD-10-CM

## 2015-10-19 ENCOUNTER — Inpatient Hospital Stay: Payer: BLUE CROSS/BLUE SHIELD | Attending: Internal Medicine

## 2015-10-19 ENCOUNTER — Inpatient Hospital Stay: Payer: BLUE CROSS/BLUE SHIELD

## 2015-10-19 ENCOUNTER — Inpatient Hospital Stay (HOSPITAL_BASED_OUTPATIENT_CLINIC_OR_DEPARTMENT_OTHER): Payer: BLUE CROSS/BLUE SHIELD | Admitting: Internal Medicine

## 2015-10-19 VITALS — BP 143/81 | HR 93 | Temp 98.1°F | Resp 20 | Ht 73.0 in | Wt 339.9 lb

## 2015-10-19 DIAGNOSIS — D751 Secondary polycythemia: Secondary | ICD-10-CM | POA: Insufficient documentation

## 2015-10-19 DIAGNOSIS — K219 Gastro-esophageal reflux disease without esophagitis: Secondary | ICD-10-CM | POA: Diagnosis not present

## 2015-10-19 DIAGNOSIS — R109 Unspecified abdominal pain: Secondary | ICD-10-CM | POA: Diagnosis not present

## 2015-10-19 DIAGNOSIS — G4730 Sleep apnea, unspecified: Secondary | ICD-10-CM

## 2015-10-19 DIAGNOSIS — K449 Diaphragmatic hernia without obstruction or gangrene: Secondary | ICD-10-CM | POA: Insufficient documentation

## 2015-10-19 DIAGNOSIS — D72819 Decreased white blood cell count, unspecified: Secondary | ICD-10-CM

## 2015-10-19 DIAGNOSIS — Z79899 Other long term (current) drug therapy: Secondary | ICD-10-CM

## 2015-10-19 DIAGNOSIS — E291 Testicular hypofunction: Secondary | ICD-10-CM | POA: Insufficient documentation

## 2015-10-19 DIAGNOSIS — I10 Essential (primary) hypertension: Secondary | ICD-10-CM | POA: Diagnosis not present

## 2015-10-19 DIAGNOSIS — R252 Cramp and spasm: Secondary | ICD-10-CM

## 2015-10-19 DIAGNOSIS — F909 Attention-deficit hyperactivity disorder, unspecified type: Secondary | ICD-10-CM | POA: Diagnosis not present

## 2015-10-19 DIAGNOSIS — M1990 Unspecified osteoarthritis, unspecified site: Secondary | ICD-10-CM

## 2015-10-19 DIAGNOSIS — Z87891 Personal history of nicotine dependence: Secondary | ICD-10-CM | POA: Diagnosis not present

## 2015-10-19 DIAGNOSIS — M129 Arthropathy, unspecified: Secondary | ICD-10-CM

## 2015-10-19 DIAGNOSIS — Z800 Family history of malignant neoplasm of digestive organs: Secondary | ICD-10-CM | POA: Diagnosis not present

## 2015-10-19 DIAGNOSIS — Z86010 Personal history of colonic polyps: Secondary | ICD-10-CM

## 2015-10-19 DIAGNOSIS — E669 Obesity, unspecified: Secondary | ICD-10-CM | POA: Diagnosis not present

## 2015-10-19 LAB — CBC WITH DIFFERENTIAL/PLATELET
Basophils Absolute: 0.1 10*3/uL (ref 0–0.1)
Basophils Relative: 1 %
Eosinophils Absolute: 0.2 10*3/uL (ref 0–0.7)
Eosinophils Relative: 2 %
HEMATOCRIT: 47.6 % (ref 40.0–52.0)
Hemoglobin: 15.5 g/dL (ref 13.0–18.0)
Lymphocytes Relative: 21 %
Lymphs Abs: 2.6 10*3/uL (ref 1.0–3.6)
MCH: 23.7 pg — ABNORMAL LOW (ref 26.0–34.0)
MCHC: 32.5 g/dL (ref 32.0–36.0)
MCV: 72.8 fL — AB (ref 80.0–100.0)
MONO ABS: 0.8 10*3/uL (ref 0.2–1.0)
MONOS PCT: 6 %
NEUTROS ABS: 8.6 10*3/uL — AB (ref 1.4–6.5)
Neutrophils Relative %: 70 %
Platelets: 313 10*3/uL (ref 150–440)
RBC: 6.55 MIL/uL — ABNORMAL HIGH (ref 4.40–5.90)
RDW: 19.5 % — AB (ref 11.5–14.5)
WBC: 12.3 10*3/uL — AB (ref 3.8–10.6)

## 2015-10-19 NOTE — Progress Notes (Signed)
[place] OFFICE PROGRESS NOTE  Patient Care Team: Shepard General, MD as PCP - General (General Practice)   SUMMARY OF HEMATOLOGIC/ONCOLOGIC HISTORY:  # 2004- SECONDARY POLYCYTHEMIA- sec to testosterone maintenanc/ OSA; Bcr-Abl FISH NEG [2003]; JAK 2 [V617] & Exon -12- NEG  # Hypotestosteronism [id number]  INTERVAL HISTORY: A very pleasant 45 year old male patient with above history of likely secondary erythrocytosis is here for follow-up.   Patient has noted abdominal cramps muscle cramps in the last few weeks. He also complains of fatigue. No headaches. Patient denies any strokelike symptoms denies any headaches or changes or any discoloration of fingertips and toes.  REVIEW OF SYSTEMS:  A complete 10 point review of system is done which is negative except mentioned above/history of present illness.   PAST MEDICAL HISTORY :  Past Medical History  Diagnosis Date  . GERD (gastroesophageal reflux disease)   . Adult ADHD   . Hypertension     dr Burt Ek   [place]  . Sleep apnea     cpap  . Polycythemia, secondary 12/20/2014  . Obesity   . Colon polyps   . Barrett esophagus   . Testosterone deficiency   . Hiatal hernia   . Degenerative joint disease   . Arthritis   . Undescended right testicle   . Leukocytosis     PAST SURGICAL HISTORY :   Past Surgical History  Procedure Laterality Date  . Joint replacement      rt shoulder  . Appendectomy    . Eye surgery    . Cholecystectomy    . Colonoscopy  06/2010    + small bowel capsule study  . Knee surgery      FAMILY HISTORY :   Family History  Problem Relation Age of Onset  . Colon cancer Other     grandfather  . Thyroid cancer Father     SOCIAL HISTORY:   Social History  Substance Use Topics  . Smoking status: Never Smoker   . Smokeless tobacco: Former Systems developer    Types: Chew  . Alcohol Use: 0.0 oz/week    0 Standard drinks or equivalent per week     Comment: occ    ALLERGIES:  has No  Known Allergies.  MEDICATIONS:  Current Outpatient Prescriptions  Medication Sig Dispense Refill  . amphetamine-dextroamphetamine (ADDERALL XR) 20 MG 24 hr capsule Take 20 mg by mouth daily.    [name] esomeprazole (NEXIUM) 40 MG capsule Take 40 mg by mouth daily at 12 noon.    [name] lisinopril (PRINIVIL,ZESTRIL) 20 MG tablet Take 20 mg by mouth daily.    . meloxicam (MOBIC) 15 MG tablet Take 15 mg by mouth daily.    [name] terbinafine (LAMISIL) 250 MG tablet Take 1 tablet (250 mg total) by mouth daily. 30 tablet 2  . testosterone cypionate (DEPOTESTOTERONE CYPIONATE) 200 MG/ML injection See admin instructions.  0   No current facility-administered medications for this visit.    PHYSICAL EXAMINATION: ECOG PERFORMANCE STATUS: 0 - Asymptomatic  BP 143/81 mmHg  Pulse 93  Temp(Src) 98.1 F (36.7 C) (Oral)  Resp 20  Ht 6\' 1"  (1.854 m)  Wt 339 lb 15.2 oz (154.2 kg)  BMI 44.86 kg/m2  Filed Weights   10/19/15 0849  Weight: 339 lb 15.2 oz (154.2 kg)    GENERAL: Well-nourished well-developed; Alert, no distress and comfortable.   Alone.  EYES: no pallor or icterus OROPHARYNX: no thrush or ulceration; good dentition  NECK: supple, no masses felt LYMPH:  no  palpable lymphadenopathy in the cervical, axillary or inguinal regions LUNGS: clear to auscultation and  No wheeze or crackles HEART/CVS: regular rate & rhythm and no murmurs; No lower extremity edema ABDOMEN:abdomen soft, non-tender and normal bowel sounds Musculoskeletal:no cyanosis of digits and no clubbing  PSYCH: alert & oriented x 3 with fluent speech NEURO: no focal motor/sensory deficits SKIN:  no rashes or significant lesions  LABORATORY DATA:  I have reviewed the data as listed    Component Value Date/Time   NA 140 09/09/2013 0833   K 4.8 09/09/2013 0833   CL 103 09/09/2013 0833   CO2 26 09/09/2013 0833   GLUCOSE 89 09/09/2013 0833   BUN 13 09/09/2013 0833   CREATININE 0.96 09/09/2013 0833   CALCIUM 9.7 09/09/2013 0833    GFRNONAA >90 09/09/2013 0833   GFRAA >90 09/09/2013 0833    No results found for: SPEP, UPEP  Lab Results  Component Value Date   WBC 12.3* 10/19/2015   NEUTROABS 8.6* 10/19/2015   HGB 15.5 10/19/2015   HCT 47.6 10/19/2015   MCV 72.8* 10/19/2015   PLT 313 10/19/2015      Chemistry      Component Value Date/Time   NA 140 09/09/2013 0833   K 4.8 09/09/2013 0833   CL 103 09/09/2013 0833   CO2 26 09/09/2013 0833   BUN 13 09/09/2013 0833   CREATININE 0.96 09/09/2013 0833      Component Value Date/Time   CALCIUM 9.7 09/09/2013 0833        ASSESSMENT & PLAN:   # Secondary erythrocytosis likely secondary to testosterone supplementation/history of obstructive sleep apnea on CPAP. Patient seems to be getting symptomatic when hematocrit goes beyond 46. Would recommend keeping the hematocrit less than 46.   # Patient also states that he could have phlebotomy done at work- every 2-3 months is reasonable.   # Patient follow-up with [place] in 6 months/CBC/ phlebotomy.  #  Also recommended aspirin 81 mg a day.      Cammie Sickle, MD 10/19/2015 9:07 AM

## 2015-10-24 ENCOUNTER — Emergency Department (HOSPITAL_COMMUNITY)
Admission: EM | Admit: 2015-10-24 | Discharge: 2015-10-24 | Disposition: A | Discharge status: Home / Self Care | Payer: Worker's Compensation | Attending: Emergency Medicine | Admitting: Emergency Medicine

## 2015-10-24 ENCOUNTER — Encounter (HOSPITAL_COMMUNITY): Payer: Self-pay | Admitting: Family Medicine

## 2015-10-24 DIAGNOSIS — Z79899 Other long term (current) drug therapy: Secondary | ICD-10-CM | POA: Insufficient documentation

## 2015-10-24 DIAGNOSIS — I10 Essential (primary) hypertension: Secondary | ICD-10-CM | POA: Diagnosis not present

## 2015-10-24 DIAGNOSIS — K219 Gastro-esophageal reflux disease without esophagitis: Secondary | ICD-10-CM | POA: Insufficient documentation

## 2015-10-24 DIAGNOSIS — Z791 Long term (current) use of non-steroidal anti-inflammatories (NSAID): Secondary | ICD-10-CM | POA: Diagnosis not present

## 2015-10-24 DIAGNOSIS — M1990 Unspecified osteoarthritis, unspecified site: Secondary | ICD-10-CM | POA: Insufficient documentation

## 2015-10-24 DIAGNOSIS — Z8719 Personal history of other diseases of the digestive system: Secondary | ICD-10-CM | POA: Insufficient documentation

## 2015-10-24 DIAGNOSIS — E669 Obesity, unspecified: Secondary | ICD-10-CM | POA: Diagnosis not present

## 2015-10-24 DIAGNOSIS — Z862 Personal history of diseases of the blood and blood-forming organs and certain disorders involving the immune mechanism: Secondary | ICD-10-CM | POA: Diagnosis not present

## 2015-10-24 DIAGNOSIS — Z86010 Personal history of colonic polyps: Secondary | ICD-10-CM | POA: Diagnosis not present

## 2015-10-24 DIAGNOSIS — Z9981 Dependence on supplemental oxygen: Secondary | ICD-10-CM | POA: Insufficient documentation

## 2015-10-24 DIAGNOSIS — M5442 Lumbago with sciatica, left side: Secondary | ICD-10-CM | POA: Diagnosis not present

## 2015-10-24 DIAGNOSIS — Q539 Undescended testicle, unspecified: Secondary | ICD-10-CM | POA: Insufficient documentation

## 2015-10-24 DIAGNOSIS — G4730 Sleep apnea, unspecified: Secondary | ICD-10-CM | POA: Diagnosis not present

## 2015-10-24 DIAGNOSIS — M545 Low back pain: Secondary | ICD-10-CM | POA: Diagnosis present

## 2015-10-24 DIAGNOSIS — F909 Attention-deficit hyperactivity disorder, unspecified type: Secondary | ICD-10-CM | POA: Diagnosis not present

## 2015-10-24 LAB — URINALYSIS, ROUTINE W REFLEX MICROSCOPIC
Bilirubin Urine: NEGATIVE
Glucose, UA: NEGATIVE mg/dL
Hgb urine dipstick: NEGATIVE
Ketones, ur: NEGATIVE mg/dL
Leukocytes, UA: NEGATIVE
Nitrite: NEGATIVE
Protein, ur: NEGATIVE mg/dL
Specific Gravity, Urine: 1.013 (ref 1.005–1.030)
pH: 7.5 (ref 5.0–8.0)

## 2015-10-24 MED ORDER — CYCLOBENZAPRINE HCL 10 MG PO TABS: 10.0000 mg | ORAL_TABLET | Freq: Two times a day (BID) | ORAL | Status: DC | PRN

## 2015-10-24 MED ORDER — HYDROMORPHONE HCL 1 MG/ML IJ SOLN
1.0000 mg | Freq: Once | INTRAMUSCULAR | Status: AC
  Administered 2015-10-24: 1 mg via INTRAMUSCULAR
  Filled 2015-10-24: qty 1

## 2015-10-24 MED ORDER — OXYCODONE-ACETAMINOPHEN 5-325 MG PO TABS: 1.0000 | ORAL_TABLET | Freq: Once | ORAL | Status: DC

## 2015-10-24 MED ORDER — KETOROLAC TROMETHAMINE 60 MG/2ML IM SOLN
60.0000 mg | Freq: Once | INTRAMUSCULAR | Status: AC
  Administered 2015-10-24: 60 mg via INTRAMUSCULAR
  Filled 2015-10-24: qty 2

## 2015-10-24 MED ORDER — OXYCODONE-ACETAMINOPHEN 5-325 MG PO TABS: 2.0000 | ORAL_TABLET | ORAL | Status: DC | PRN

## 2015-10-24 NOTE — Discharge Instructions (Signed)
Sciatica °Sciatica is pain, weakness, numbness, or tingling along the path of the sciatic nerve. The nerve starts in the lower back and runs down the back of each leg. The nerve controls the muscles in the lower leg and in the back of the knee, while also providing sensation to the back of the thigh, lower leg, and the sole of your foot. Sciatica is a symptom of another medical condition. For instance, nerve damage or certain conditions, such as a herniated disk or bone spur on the spine, pinch or put pressure on the sciatic nerve. This causes the pain, weakness, or other sensations normally associated with sciatica. Generally, sciatica only affects one side of the body. °CAUSES  °· Herniated or slipped disc. °· Degenerative disk disease. °· A pain disorder involving the narrow muscle in the buttocks (piriformis syndrome). °· Pelvic injury or fracture. °· Pregnancy. °· Tumor (rare). °SYMPTOMS  °Symptoms can vary from mild to very severe. The symptoms usually travel from the low back to the buttocks and down the back of the leg. Symptoms can include: °· Mild tingling or dull aches in the lower back, leg, or hip. °· Numbness in the back of the calf or sole of the foot. °· Burning sensations in the lower back, leg, or hip. °· Sharp pains in the lower back, leg, or hip. °· Leg weakness. °· Severe back pain inhibiting movement. °These symptoms may get worse with coughing, sneezing, laughing, or prolonged sitting or standing. Also, being overweight may worsen symptoms. °DIAGNOSIS  °Your caregiver will perform a physical exam to look for common symptoms of sciatica. He or she may ask you to do certain movements or activities that would trigger sciatic nerve pain. Other tests may be performed to find the cause of the sciatica. These may include: °· Blood tests. °· X-rays. °· Imaging tests, such as an MRI or CT scan. °TREATMENT  °Treatment is directed at the cause of the sciatic pain. Sometimes, treatment is not necessary  and the pain and discomfort goes away on its own. If treatment is needed, your caregiver may suggest: °· Over-the-counter medicines to relieve pain. °· Prescription medicines, such as anti-inflammatory medicine, muscle relaxants, or narcotics. °· Applying heat or ice to the painful area. °· Steroid injections to lessen pain, irritation, and inflammation around the nerve. °· Reducing activity during periods of pain. °· Exercising and stretching to strengthen your abdomen and improve flexibility of your spine. Your caregiver may suggest losing weight if the extra weight makes the back pain worse. °· Physical therapy. °· Surgery to eliminate what is pressing or pinching the nerve, such as a bone spur or part of a herniated disk. °HOME CARE INSTRUCTIONS  °· Only take over-the-counter or prescription medicines for pain or discomfort as directed by your caregiver. °· Apply ice to the affected area for 20 minutes, 3-4 times a day for the first 48-72 hours. Then try heat in the same way. °· Exercise, stretch, or perform your usual activities if these do not aggravate your pain. °· Attend physical therapy sessions as directed by your caregiver. °· Keep all follow-up appointments as directed by your caregiver. °· Do not wear high heels or shoes that do not provide proper support. °· Check your mattress to see if it is too soft. A firm mattress may lessen your pain and discomfort. °SEEK IMMEDIATE MEDICAL CARE IF:  °· You lose control of your bowel or bladder (incontinence). °· You have increasing weakness in the lower back, pelvis, buttocks,   or legs.  You have redness or swelling of your back.  You have a burning sensation when you urinate.  You have pain that gets worse when you lie down or awakens you at night.  Your pain is worse than you have experienced in the past.  Your pain is lasting longer than 4 weeks.  You are suddenly losing weight without reason. MAKE SURE YOU:  Understand these  instructions.  Will watch your condition.  Will get help right away if you are not doing well or get worse.   This information is not intended to replace advice given to you by your health care provider. Make sure you discuss any questions you have with your health care provider.  Keep scheduled appointment with your neurosurgeon in 6 days for reevaluation. Take pain medication and muscle relaxers as needed. Apply ice to affected area. If for any reason you experience numbness or tingling in both of your lower extremities, fever or bowel or bladder incontinence please return to the emergency Department immediately.

## 2015-10-24 NOTE — ED Notes (Signed)
Declined W/C at D/C and was escorted to lobby by RN. 

## 2015-10-24 NOTE — ED Provider Notes (Signed)
CSN: [id number]     Arrival date & time 10/24/15  1045 History  By signing my name below, I, Emmanuella Mensah, attest that this documentation has been prepared under the direction and in the presence of [employer], PA-C. Electronically Signed: Judithann Sauger, ED Scribe. 10/24/2015. 11:48 AM.    Chief Complaint  Patient presents with  . Back Pain   The history is provided by the patient. No language interpreter was used.   HPI Comments: Jeffrey Carlson is a 45 y.o. male with a hx of HTN and herniated discs who presents to the Emergency Department complaining of gradually worsening moderate lower back pain (mostly central) s/p moving heavy boxes yesterday. He explains that his pain increased over the course of the day. He states that any movement makes the pain worse and lying very still provides mild relief. Pt reports that he tried 500 mg Flexeril with no relief. He states that the pain is in the same place as his herniated disc. Pt has seen orthopedics for this issue but has not had surgery because his pain was not as severe. Pt has upcoming appointment with neurosurgery in 6 days. He denies any fever, chills, urinary symptoms, or bladder/bowel incontinence.    Past Medical History  Diagnosis Date  . GERD (gastroesophageal reflux disease)   . Adult ADHD   . Hypertension     dr Burt Ek   [place]  . Sleep apnea     cpap  . Polycythemia, secondary 12/20/2014  . Obesity   . Colon polyps   . Barrett esophagus   . Testosterone deficiency   . Hiatal hernia   . Degenerative joint disease   . Arthritis   . Undescended right testicle   . Leukocytosis    Past Surgical History  Procedure Laterality Date  . Joint replacement      rt shoulder  . Appendectomy    . Eye surgery    . Cholecystectomy    . Colonoscopy  06/2010    + small bowel capsule study  . Knee surgery     Family History  Problem Relation Age of Onset  . Colon cancer Other     grandfather  . Thyroid  cancer Father    Social History  Substance Use Topics  . Smoking status: Never Smoker   . Smokeless tobacco: Former Systems developer    Types: Chew  . Alcohol Use: 0.0 oz/week    0 Standard drinks or equivalent per week     Comment: occ    Review of Systems  Constitutional: Negative for fever and chills.  Genitourinary: Negative for dysuria and hematuria.  Musculoskeletal: Positive for back pain.      Allergies  Review of patient's allergies indicates no known allergies.  Home Medications   Prior to Admission medications   Medication Sig Start Date End Date Taking? Authorizing Provider  amphetamine-dextroamphetamine (ADDERALL XR) 20 MG 24 hr capsule Take 20 mg by mouth daily.    Historical Provider, MD  esomeprazole (NEXIUM) 40 MG capsule Take 40 mg by mouth daily at 12 noon.    Historical Provider, MD  lisinopril (PRINIVIL,ZESTRIL) 20 MG tablet Take 20 mg by mouth daily.    Historical Provider, MD  meloxicam (MOBIC) 15 MG tablet Take 15 mg by mouth daily.    Historical Provider, MD  terbinafine (LAMISIL) 250 MG tablet Take 1 tablet (250 mg total) by mouth daily. 08/28/15   [name], DPM  testosterone cypionate (DEPOTESTOTERONE CYPIONATE) 200 MG/ML injection See admin  instructions. 12/14/14   Historical Provider, MD   BP 129/83 mmHg  Pulse 89  Temp(Src) 97.9 F (36.6 C) (Oral)  Resp 20  SpO2 100% Physical Exam  Constitutional: He is oriented to person, place, and time. He appears well-developed and well-nourished. No distress.  Pt tearful and in obvious discomfort  HENT:  Head: Normocephalic and atraumatic.  Eyes: Conjunctivae are normal. Right eye exhibits no discharge. Left eye exhibits no discharge. No scleral icterus.  Cardiovascular: Normal rate.   Pulmonary/Chest: Effort normal.  Musculoskeletal:  TTP over midline lumbar spine and left lumbar paraspinal muscles Negative SLR Hunched gait, no ataxia  Neurological: He is alert and oriented to person, place, and time.  Coordination normal.  No sensory deficits.  Gait 5/5 throughout   Skin: Skin is warm and dry. No rash noted. He is not diaphoretic. No erythema. No pallor.  Psychiatric: He has a normal mood and affect. His behavior is normal.  Nursing note and vitals reviewed.   ED Course  Procedures (including critical care time) DIAGNOSTIC STUDIES: Oxygen Saturation is 99% on RA, normal by my interpretation.    COORDINATION OF CARE: 11:18 AM- Pt advised of plan for treatment and pt agrees. Pt will receive Dilaudid for pain. He will also receive UA for further evaluation. Advised to honor upcoming appointment.    Labs Review Labs Reviewed  URINALYSIS, ROUTINE W REFLEX MICROSCOPIC (NOT AT [facility])    Imaging Review No results found.   Donnald Garre, PA-C has personally reviewed and evaluated these images and lab results as part of her medical decision-making.  MDM   Final diagnoses:  Left-sided low back pain with left-sided sciatica   Patient with left lower back pain after lifting heavy boxes yesterday. Pt has hx of herniated disc at L4-5.  No neurological deficits and normal neuro exam.  Patient can walk but states is painful.  No loss of bowel or bladder control.  No concern for cauda equina.  No fever, night sweats, weight loss, h/o cancer, IVDU. Pain managed in ED which provided pt significant symptomatic relief. Pt now able to ambulate in ED without difficulty. Pt has scheduled follow up visit with neurosurgery in 6 days. NO advanced imaging indicated at this time as it would not change management. Pt agrees with treatment plan. RICE protocol and pain medicine indicated and discussed with patient. Return precautions outlined in patient discharge instructions.      I personally performed the services described in this documentation, which was scribed in my presence. The recorded information has been reviewed and is accurate.     Dondra Spry [place], PA-C 10/24/15 [place], MD 10/24/15 2117

## 2015-10-24 NOTE — ED Notes (Addendum)
Pt here for lower back pain. sts he was lifting some boxes at work yesterday and had to leave early due to the pain. sts hx of ruptured disk.

## 2015-10-30 DIAGNOSIS — M5416 Radiculopathy, lumbar region: Secondary | ICD-10-CM | POA: Insufficient documentation

## 2015-11-02 ENCOUNTER — Ambulatory Visit: Payer: BLUE CROSS/BLUE SHIELD | Admitting: Sports Medicine

## 2015-11-22 DIAGNOSIS — M5126 Other intervertebral disc displacement, lumbar region: Secondary | ICD-10-CM | POA: Insufficient documentation

## 2015-11-30 ENCOUNTER — Other Ambulatory Visit: Payer: Self-pay | Admitting: Sports Medicine

## 2015-12-04 ENCOUNTER — Encounter: Payer: Self-pay | Admitting: Sports Medicine

## 2015-12-04 ENCOUNTER — Ambulatory Visit (INDEPENDENT_AMBULATORY_CARE_PROVIDER_SITE_OTHER): Payer: BLUE CROSS/BLUE SHIELD | Admitting: Sports Medicine

## 2015-12-04 DIAGNOSIS — M79672 Pain in left foot: Secondary | ICD-10-CM

## 2015-12-04 DIAGNOSIS — M79671 Pain in right foot: Secondary | ICD-10-CM | POA: Diagnosis not present

## 2015-12-04 DIAGNOSIS — Q828 Other specified congenital malformations of skin: Secondary | ICD-10-CM

## 2015-12-04 DIAGNOSIS — B351 Tinea unguium: Secondary | ICD-10-CM | POA: Diagnosis not present

## 2015-12-04 DIAGNOSIS — M722 Plantar fascial fibromatosis: Secondary | ICD-10-CM

## 2015-12-04 NOTE — Progress Notes (Signed)
Patient ID: DELOYD HANDY, male   DOB: [birth year], 45 y.o.   MRN: [id number]  Subjective: Jeffrey Carlson is a 45 y.o. male patient returns to office for check after completing Lamisil and to PUO. Desires left foot callus trim. Patient is s/p 1 week back surgery. Denies any other pedal complaints or symptoms at this time.   Patient Active Problem List   Diagnosis Date Noted  . Atypical chest pain 01/18/2015  . Bundle branch block, right 01/18/2015  . Breath shortness 01/18/2015  . Polycythemia, secondary 12/20/2014  . Disorder of rotator cuff 10/10/2013   Current Outpatient Prescriptions on File Prior to Visit  Medication Sig Dispense Refill  . amphetamine-dextroamphetamine (ADDERALL XR) 20 MG 24 hr capsule Take 20 mg by mouth daily.    . cyclobenzaprine (FLEXERIL) 10 MG tablet Take 1 tablet (10 mg total) by mouth 2 (two) times daily as needed for muscle spasms. 20 tablet 0  . esomeprazole (NEXIUM) 40 MG capsule Take 40 mg by mouth daily at 12 noon.    [name] lisinopril (PRINIVIL,ZESTRIL) 20 MG tablet Take 20 mg by mouth daily.    . meloxicam (MOBIC) 15 MG tablet Take 15 mg by mouth daily.    [name] oxyCODONE-acetaminophen (PERCOCET/ROXICET) 5-325 MG tablet Take 2 tablets by mouth every 4 (four) hours as needed for severe pain. 15 tablet 0  . terbinafine (LAMISIL) 250 MG tablet Take 1 tablet (250 mg total) by mouth daily. 30 tablet 2  . testosterone cypionate (DEPOTESTOTERONE CYPIONATE) 200 MG/ML injection See admin instructions.  0   No current facility-administered medications on file prior to visit.   No Known Allergies   Objective: Physical Exam General: The patient is alert and oriented x3 in no acute distress.  Dermatology: Skin is warm, dry and supple bilateral lower extremities. Nails 1-10 are mildy elongated with subungal debris and brown at left hallux toenail with no surrounding infection, mild proximal clearing. There is no erythema, edema, no eccymosis, no open lesions present. +  porokeratosis sub met 4 on left. Integument is otherwise unremarkable.  Vascular: Dorsalis Pedis pulse and Posterior Tibial pulse are 2/4 bilateral. Capillary fill time is immediate to all digits.  Neurological: Grossly intact to light touch with an achilles reflex of +2/5 and a negative Tinel's and moulders sign bilateral.  Musculoskeletal: Mild tenderness to lesser MTPJs left>right and at keratosis site, No Tenderness to palpation at the medial calcaneal tubercale and through the insertion of the plantar fascia bilateral. No pain with compression of calcaneus bilateral. No pain with tuning fork to calcaneus bilateral. No pain with calf compression bilateral. No pain with palpation to left ankle and no instability.  All other joints range of motion within normal limits bilateral. Strength 5/5 in all groups bilateral.   Orthotics: Contour arch and provide support in gait  Assessment and Plan: Problem List Items Addressed This Visit    None    Visit Diagnoses    Dermatophytosis of nail    -  Primary    Plantar fascial fibromatosis        Porokeratosis        Foot pain, bilateral          -Complete examination performed.  -Orthotics dispensed and educated patient on break in period -Mechanically debrided porokeratotic lesion sub met 4 on left with sterile chisel blade without incident -Recommended good supportive shoes daily -Cont with Lamisil until completed -Patient to return to office in 6-8 weeks for follow up after completing Lamisil and  to check orthotics or sooner if problems or questions arise.  [name], DPM

## 2015-12-05 ENCOUNTER — Emergency Department
Admission: EM | Admit: 2015-12-05 | Discharge: 2015-12-05 | Disposition: A | Discharge status: Home / Self Care | Payer: BLUE CROSS/BLUE SHIELD | Attending: Emergency Medicine | Admitting: Emergency Medicine

## 2015-12-05 DIAGNOSIS — Z79899 Other long term (current) drug therapy: Secondary | ICD-10-CM | POA: Diagnosis not present

## 2015-12-05 DIAGNOSIS — Z791 Long term (current) use of non-steroidal anti-inflammatories (NSAID): Secondary | ICD-10-CM | POA: Diagnosis not present

## 2015-12-05 DIAGNOSIS — M1990 Unspecified osteoarthritis, unspecified site: Secondary | ICD-10-CM | POA: Diagnosis not present

## 2015-12-05 DIAGNOSIS — I10 Essential (primary) hypertension: Secondary | ICD-10-CM | POA: Insufficient documentation

## 2015-12-05 DIAGNOSIS — Z87891 Personal history of nicotine dependence: Secondary | ICD-10-CM | POA: Diagnosis not present

## 2015-12-05 DIAGNOSIS — K5909 Other constipation: Secondary | ICD-10-CM | POA: Diagnosis present

## 2015-12-05 DIAGNOSIS — E669 Obesity, unspecified: Secondary | ICD-10-CM | POA: Diagnosis not present

## 2015-12-05 DIAGNOSIS — K5903 Drug induced constipation: Secondary | ICD-10-CM

## 2015-12-05 MED ORDER — MAGNESIUM CITRATE PO SOLN
1.0000 | Freq: Once | ORAL | Status: AC
  Administered 2015-12-05: 1 via ORAL
  Filled 2015-12-05: qty 296

## 2015-12-05 MED ORDER — LIDOCAINE HCL 2 % EX GEL
CUTANEOUS | Status: AC
  Administered 2015-12-05: 1
  Filled 2015-12-05: qty 10

## 2015-12-05 MED ORDER — MINERAL OIL RE ENEM
1.0000 | ENEMA | Freq: Once | RECTAL | Status: AC
  Administered 2015-12-05: 1 via RECTAL

## 2015-12-05 MED ORDER — LIDOCAINE HCL 2 % EX GEL
1.0000 "application " | Freq: Once | CUTANEOUS | Status: AC
  Administered 2015-12-05: 1

## 2015-12-05 NOTE — Discharge Instructions (Signed)
Advised over-the-counter daily fiber supplement while taking narcotic pain medication.

## 2015-12-05 NOTE — ED Notes (Signed)
Patient had surgery for back, has been on narcotics and taking stool softeners as prescribed.  Pt unable to have BM today and states he feels something hard.  Pt tried glycerin suppository and fleets enema at home, but pt unable to tolerate.

## 2015-12-05 NOTE — ED Notes (Signed)
Pt discharged to home.  Family member driving.  Discharge instructions reviewed.  Verbalized understanding.  No questions or concerns at this time.  Teach back verified.  Pt in NAD.  No items left in ED.   

## 2015-12-05 NOTE — ED Notes (Signed)
Pt had large stool passed.

## 2015-12-05 NOTE — ED Provider Notes (Signed)
[facility] Emergency Department Provider Note   ____________________________________________  Time seen: Approximately 7:33 PM  I have reviewed the triage vital signs and the nursing notes.   HISTORY  Chief Complaint Constipation    HPI Jeffrey Carlson is a 45 y.o. male patient complaining of abdominal pain secondary to constipation. Patient stated status post back surgery has been continue use of narcotic pain medication. Patient stated he noticed decrease bowel movement for 1 week. Patient state he has to strain and only producing small amount of stool. Patient state he tried to use a glycerin suppository and Fleet enema at home was unable to tolerate the self procedure. Patient denies any blood on the stool/ tissue paper since onset of complaint. No other palliative measures for this complaint. Past Medical History  Diagnosis Date  . GERD (gastroesophageal reflux disease)   . Adult ADHD   . Hypertension     dr Burt Ek   [place]  . Sleep apnea     cpap  . Polycythemia, secondary 12/20/2014  . Obesity   . Colon polyps   . Barrett esophagus   . Testosterone deficiency   . Hiatal hernia   . Degenerative joint disease   . Arthritis   . Undescended right testicle   . Leukocytosis     Patient Active Problem List   Diagnosis Date Noted  . Atypical chest pain 01/18/2015  . Bundle branch block, right 01/18/2015  . Breath shortness 01/18/2015  . Polycythemia, secondary 12/20/2014  . Disorder of rotator cuff 10/10/2013    Past Surgical History  Procedure Laterality Date  . Joint replacement      rt shoulder  . Appendectomy    . Eye surgery    . Cholecystectomy    . Colonoscopy  06/2010    + small bowel capsule study  . Knee surgery      Current Outpatient Rx  Name  Route  Sig  Dispense  Refill  . amphetamine-dextroamphetamine (ADDERALL XR) 20 MG 24 hr capsule   Oral   Take 20 mg by mouth daily.         . cyclobenzaprine  (FLEXERIL) 10 MG tablet   Oral   Take 1 tablet (10 mg total) by mouth 2 (two) times daily as needed for muscle spasms.   20 tablet   0   . esomeprazole (NEXIUM) 40 MG capsule   Oral   Take 40 mg by mouth daily at 12 noon.         [name] lisinopril (PRINIVIL,ZESTRIL) 20 MG tablet   Oral   Take 20 mg by mouth daily.         . meloxicam (MOBIC) 15 MG tablet   Oral   Take 15 mg by mouth daily.         [name] oxyCODONE-acetaminophen (PERCOCET/ROXICET) 5-325 MG tablet   Oral   Take 2 tablets by mouth every 4 (four) hours as needed for severe pain.   15 tablet   0   . terbinafine (LAMISIL) 250 MG tablet   Oral   Take 1 tablet (250 mg total) by mouth daily.   30 tablet   2   . testosterone cypionate (DEPOTESTOTERONE CYPIONATE) 200 MG/ML injection      See admin instructions.      0     Allergies Review of patient's allergies indicates no known allergies.  Family History  Problem Relation Age of Onset  . Colon cancer Other     grandfather  .  Thyroid cancer Father     Social History Social History  Substance Use Topics  . Smoking status: Never Smoker   . Smokeless tobacco: Former Systems developer    Types: Chew  . Alcohol Use: 0.0 oz/week    0 Standard drinks or equivalent per week     Comment: occ    Review of Systems Constitutional: No fever/chills. Morbid obesity Eyes: No visual changes. ENT: No sore throat. Cardiovascular: Denies chest pain. Respiratory: Denies shortness of breath. Gastrointestinal: Abdominal pain.  No nausea, no vomiting.  No diarrhea.  Constipation Genitourinary: Negative for dysuria. Musculoskeletal: Positive for back pain. Skin: Negative for rash. Neurological: Negative for headaches, focal weakness or numbness. Psychiatric:ADHD Endocrine:Hypertension  ____________________________________________   PHYSICAL EXAM:  VITAL SIGNS: ED Triage Vitals  Enc Vitals Group     BP --      Pulse --      Resp --      Temp --      Temp src --       SpO2 --      Weight --      Height --      Head Cir --      Peak Flow --      Pain Score --      Pain Loc --      Pain Edu? --      Excl. in [place]? --     Constitutional: Alert and oriented. Well appearing and in no acute distress. Eyes: Conjunctivae are normal. PERRL. EOMI. Head: Atraumatic. Nose: No congestion/rhinnorhea. Mouth/Throat: Mucous membranes are moist.  Oropharynx non-erythematous. Neck: No stridor.  No cervical spine tenderness to palpation. Hematological/Lymphatic/Immunilogical: No cervical lymphadenopathy. Cardiovascular: Normal rate, regular rhythm. Grossly normal heart sounds.  Good peripheral circulation. Respiratory: Normal respiratory effort.  No retractions. Lungs CTAB. Gastrointestinal: Decreased bowel sounds. Mild abdominal distention. Negative occult blood on digital exam  Musculoskeletal: No lower extremity tenderness nor edema.  No joint effusions. Neurologic:  Normal speech and language. No gross focal neurologic deficits are appreciated. No gait instability. Skin:  Skin is warm, dry and intact. No rash noted. Psychiatric: Mood and affect are normal. Speech and behavior are normal.  ____________________________________________   LABS (all labs ordered are listed, but only abnormal results are displayed)  Labs Reviewed - No data to display ____________________________________________  EKG  ____________________________________________  RADIOLOGY   ____________________________________________   PROCEDURES  Procedure(s) performed: None  Critical Care performed: No  ____________________________________________   INITIAL IMPRESSION / ASSESSMENT AND PLAN / ED COURSE  Pertinent labs & imaging results that were available during my care of the patient were reviewed by me and considered in my medical decision making (see chart for details).  Constipation. Patient was given soapsuds enema which produced moderate amount of stool. Patient given  discharge Instructions. Patient advised to consider daily fiber supplement and follow-up with family doctor. ____________________________________________   FINAL CLINICAL IMPRESSION(S) / ED DIAGNOSES  Final diagnoses:  Constipation due to pain medication therapy      NEW MEDICATIONS STARTED DURING THIS VISIT:  New Prescriptions   No medications on file     Note:  This document was prepared using Dragon voice recognition software and may include unintentional dictation errors.    Sable Feil, PA-C 12/05/15 2044  Earleen Newport, MD 12/05/15 [phone]

## 2016-01-22 ENCOUNTER — Ambulatory Visit (INDEPENDENT_AMBULATORY_CARE_PROVIDER_SITE_OTHER): Payer: BLUE CROSS/BLUE SHIELD | Admitting: Sports Medicine

## 2016-01-22 ENCOUNTER — Encounter: Payer: Self-pay | Admitting: Sports Medicine

## 2016-01-22 DIAGNOSIS — M722 Plantar fascial fibromatosis: Secondary | ICD-10-CM | POA: Diagnosis not present

## 2016-01-22 DIAGNOSIS — M79672 Pain in left foot: Secondary | ICD-10-CM | POA: Diagnosis not present

## 2016-01-22 DIAGNOSIS — M5410 Radiculopathy, site unspecified: Secondary | ICD-10-CM | POA: Diagnosis not present

## 2016-01-22 DIAGNOSIS — B351 Tinea unguium: Secondary | ICD-10-CM | POA: Diagnosis not present

## 2016-01-22 DIAGNOSIS — L853 Xerosis cutis: Secondary | ICD-10-CM | POA: Diagnosis not present

## 2016-01-22 DIAGNOSIS — M79671 Pain in right foot: Secondary | ICD-10-CM

## 2016-01-22 NOTE — Progress Notes (Signed)
Patient ID: Jeffrey Carlson, male   DOB: 09-29-1970, 45 y.o.   MRN: [id number] Subjective: Jeffrey Carlson is a 45 y.o. male patient returns to office for follow up evaluation after orthotics, states that they seem to help but its too early to tell; feels best sometimes with sneakers and have not worn them long enough in his work boots to tell if they are helping. Patient also has finished 12 week course of lamisil with no issues. Denies any other pedal complaints or symptoms at this time.   S/p back surgery with tingling to toes  Patient Active Problem List   Diagnosis Date Noted  . Atypical chest pain 01/18/2015  . Bundle branch block, right 01/18/2015  . Breath shortness 01/18/2015  . Polycythemia, secondary 12/20/2014  . Disorder of rotator cuff 10/10/2013   Current Outpatient Prescriptions on File Prior to Visit  Medication Sig Dispense Refill  . amphetamine-dextroamphetamine (ADDERALL XR) 20 MG 24 hr capsule Take 20 mg by mouth daily.    . cyclobenzaprine (FLEXERIL) 10 MG tablet Take 1 tablet (10 mg total) by mouth 2 (two) times daily as needed for muscle spasms. 20 tablet 0  . esomeprazole (NEXIUM) 40 MG capsule Take 40 mg by mouth daily at 12 noon.    [name] lisinopril (PRINIVIL,ZESTRIL) 20 MG tablet Take 20 mg by mouth daily.    . meloxicam (MOBIC) 15 MG tablet Take 15 mg by mouth daily.    [name] oxyCODONE-acetaminophen (PERCOCET/ROXICET) 5-325 MG tablet Take 2 tablets by mouth every 4 (four) hours as needed for severe pain. 15 tablet 0  . terbinafine (LAMISIL) 250 MG tablet Take 1 tablet (250 mg total) by mouth daily. 30 tablet 2  . testosterone cypionate (DEPOTESTOTERONE CYPIONATE) 200 MG/ML injection See admin instructions.  0   No current facility-administered medications on file prior to visit.   No Known Allergies   Objective: Physical Exam General: The patient is alert and oriented x3 in no acute distress.  Dermatology: Skin is warm, dry and supple bilateral lower extremities.  Nails 1-10 are mildy elongated with subungal debris and brown at left hallux toenail with no surrounding infection, mild proximal clearing. There is no erythema, edema, no eccymosis, no open lesions present.  Integument is otherwise unremarkable.  Vascular: Dorsalis Pedis pulse and Posterior Tibial pulse are 2/4 bilateral. Capillary fill time is immediate to all digits.  Neurological: Grossly intact to light touch with an achilles reflex of +2/5 and a negative Tinel's and moulders sign bilateral. Subjective tingle to toes  Musculoskeletal: No tenderness to lesser MTPJs left>right and No tenderness to previous keratosis site, No Tenderness to palpation at the medial calcaneal tubercale and through the insertion of the plantar fascia bilateral. No pain with compression of calcaneus bilateral. No pain with tuning fork to calcaneus bilateral. No pain with calf compression bilateral. No pain with palpation to left ankle and no instability.  All other joints range of motion within normal limits bilateral. Strength 5/5 in all groups bilateral.   Orthotics: Contour arch and provide support in gait  Assessment and Plan: Problem List Items Addressed This Visit    None    Visit Diagnoses    Dermatophytosis of nail    -  Primary    Foot pain, bilateral        Plantar fascial fibromatosis        Dry skin        Radicular neuropathy            -Complete  examination performed.  -Continue with orthotics -Recommended good supportive shoes daily -Cont with good hygiene habits and skin emollients, will continue to monitor nails s/p 12 week lamisil treatment -Patient to return to office as needed or sooner if problems or questions arise.  [name], DPM

## 2016-03-25 DIAGNOSIS — M5137 Other intervertebral disc degeneration, lumbosacral region: Secondary | ICD-10-CM | POA: Insufficient documentation

## 2016-04-18 ENCOUNTER — Inpatient Hospital Stay: Payer: BLUE CROSS/BLUE SHIELD

## 2016-04-18 ENCOUNTER — Inpatient Hospital Stay: Payer: BLUE CROSS/BLUE SHIELD | Admitting: Internal Medicine

## 2016-09-10 ENCOUNTER — Ambulatory Visit (INDEPENDENT_AMBULATORY_CARE_PROVIDER_SITE_OTHER): Payer: BLUE CROSS/BLUE SHIELD

## 2016-09-10 ENCOUNTER — Encounter: Payer: Self-pay | Admitting: Podiatry

## 2016-09-10 ENCOUNTER — Ambulatory Visit (INDEPENDENT_AMBULATORY_CARE_PROVIDER_SITE_OTHER): Payer: BLUE CROSS/BLUE SHIELD | Admitting: Podiatry

## 2016-09-10 DIAGNOSIS — M779 Enthesopathy, unspecified: Secondary | ICD-10-CM | POA: Diagnosis not present

## 2016-09-10 DIAGNOSIS — S62357A Nondisplaced fracture of shaft of fifth metacarpal bone, left hand, initial encounter for closed fracture: Secondary | ICD-10-CM

## 2016-09-10 NOTE — Progress Notes (Signed)
He presents today with a chief complaint of a 2 to three-week duration of pain to the fifth metatarsal of his left foot. He states that he wore a new pair shoes just the other week which started the pain cycle. He denies any trauma to the area specifically but does relate an old fracture to the fourth metatarsal base left foot. He is a Engineer, petroleum.  Objective: Vital signs are stable alert and oriented 3. Pulses are palpable. Her a lot of sensorium is intact. Deep tendon reflexes are intact muscle strength is normal and brisk bilateral. Orthopedic evaluation and strength although as this likely full range of motion by crepitation. Cutaneous evaluation demonstrates supple well-hydrated cutis mild edema mild erythema no cellulitis drainage or odor no ecchymosis overlying the fifth metatarsal that this area is exquisitely painful on palpation along the mid diaphyseal shaft of the fifth met. Regress taken today do demonstrate what appears to be a fracture running the length of the shaft of the fifth metatarsal but without cortical fracture.  Assessment: Stress fracture or endosteal stress fracture fifth metatarsal left.  Plan: Placed in a Cam Walker will follow up with him in 4 weeks.

## 2016-10-08 ENCOUNTER — Encounter: Payer: Self-pay | Admitting: Podiatry

## 2016-10-08 ENCOUNTER — Ambulatory Visit (INDEPENDENT_AMBULATORY_CARE_PROVIDER_SITE_OTHER): Payer: BLUE CROSS/BLUE SHIELD

## 2016-10-08 ENCOUNTER — Ambulatory Visit (INDEPENDENT_AMBULATORY_CARE_PROVIDER_SITE_OTHER): Payer: BLUE CROSS/BLUE SHIELD | Admitting: Podiatry

## 2016-10-08 DIAGNOSIS — S92355D Nondisplaced fracture of fifth metatarsal bone, left foot, subsequent encounter for fracture with routine healing: Secondary | ICD-10-CM | POA: Diagnosis not present

## 2016-10-08 DIAGNOSIS — B351 Tinea unguium: Secondary | ICD-10-CM | POA: Diagnosis not present

## 2016-10-08 MED ORDER — TERBINAFINE HCL 250 MG PO TABS: 250.0000 mg | ORAL_TABLET | Freq: Every day | ORAL | 0 refills | Status: DC

## 2016-10-08 NOTE — Progress Notes (Signed)
Jeffrey Carlson presents today states that he has good days and bad days with his fracture fifth metatarsal of the left foot states that he still has some swelling.  Objective: Vital signs are stable alert and oriented 3. Pulses are palpable. Mild edema and tenderness on overlying the fifth metatarsal base left. Radiographs today do demonstrate displacement of the fifth metatarsal this is a Jones fracture is distally dislocated no fracture fragments no comminution.  Assessment: Jones fracture fifth met base left.  Plan: I recommended surgical intervention he would like to allow this to heal normally if possible. I explained to him that we can do 3 more weeks before we would get scheduled for surgery. He understands this is amenable to it follow up with him in 3 weeks to see we have any healing. I recommended that he continue to wear his Cam Walker and stay off the foot as much as possible. A new Cam Walker was dispensed.

## 2016-10-29 ENCOUNTER — Ambulatory Visit: Payer: BLUE CROSS/BLUE SHIELD | Admitting: Podiatry

## 2016-10-30 ENCOUNTER — Ambulatory Visit (INDEPENDENT_AMBULATORY_CARE_PROVIDER_SITE_OTHER): Payer: BLUE CROSS/BLUE SHIELD

## 2016-10-30 ENCOUNTER — Ambulatory Visit (INDEPENDENT_AMBULATORY_CARE_PROVIDER_SITE_OTHER): Payer: BLUE CROSS/BLUE SHIELD | Admitting: Podiatry

## 2016-10-30 ENCOUNTER — Encounter: Payer: Self-pay | Admitting: Podiatry

## 2016-10-30 DIAGNOSIS — S92355D Nondisplaced fracture of fifth metatarsal bone, left foot, subsequent encounter for fracture with routine healing: Secondary | ICD-10-CM | POA: Diagnosis not present

## 2016-10-30 NOTE — Progress Notes (Signed)
He presents for follow-up of a fracture of his fifth metatarsal left. This is a Jones fracture. He continues to wear his Cam Gilford Rile states it feels better.  Objective: Vital signs are stable alert and oriented 3. Pulses are palpable. Still has tenderness on palpation of fifth metatarsal base of the left foot. Radiographs taken today do demonstrate healing of the fracture from dorsal to plantar it has healed by proximally 50%.  Assessment: Jones fracture fifth met left.  Plan: Continue use of cam walker.

## 2016-11-20 ENCOUNTER — Ambulatory Visit (INDEPENDENT_AMBULATORY_CARE_PROVIDER_SITE_OTHER): Payer: BLUE CROSS/BLUE SHIELD | Admitting: Podiatry

## 2016-11-20 ENCOUNTER — Encounter: Payer: Self-pay | Admitting: Podiatry

## 2016-11-20 ENCOUNTER — Ambulatory Visit (INDEPENDENT_AMBULATORY_CARE_PROVIDER_SITE_OTHER): Payer: BLUE CROSS/BLUE SHIELD

## 2016-11-20 DIAGNOSIS — S92355D Nondisplaced fracture of fifth metatarsal bone, left foot, subsequent encounter for fracture with routine healing: Secondary | ICD-10-CM | POA: Diagnosis not present

## 2016-11-23 NOTE — Progress Notes (Signed)
He presents today for follow-up of his fifth metatarsal fracture of his left foot. He states that on ready to get out of this boot. He states that his foot feels 100% better.  Objective: Vital signs are stable he is alert and oriented 3. Pulses are palpable. Neurologic sensorium is intact. He has minimal edema no erythema cellulitis drainage or odor no ecchymosis. He has mild tenderness on palpation of fifth met base left. Radiographs taken today demonstrate a significant amount of healing over the past month this Jones fracture. There is still some plantar cortex the knees to heal in before I feel like releasing him.  Assessment: Well healing Jones fracture with immobilization.  Plan: I'm going to ask him to continue to use the boot for one more month and I will follow-up with him at that time for another set of x-rays I feel that we will be able to release him to full duty at that time.

## 2016-11-26 ENCOUNTER — Ambulatory Visit: Payer: BLUE CROSS/BLUE SHIELD | Admitting: Podiatry

## 2016-12-15 ENCOUNTER — Ambulatory Visit: Payer: BLUE CROSS/BLUE SHIELD

## 2016-12-15 ENCOUNTER — Encounter (INDEPENDENT_AMBULATORY_CARE_PROVIDER_SITE_OTHER): Payer: BLUE CROSS/BLUE SHIELD | Admitting: Podiatry

## 2016-12-15 DIAGNOSIS — S92355D Nondisplaced fracture of fifth metatarsal bone, left foot, subsequent encounter for fracture with routine healing: Secondary | ICD-10-CM

## 2016-12-15 NOTE — Progress Notes (Signed)
This encounter was created in error - please disregard.

## 2016-12-16 ENCOUNTER — Ambulatory Visit (INDEPENDENT_AMBULATORY_CARE_PROVIDER_SITE_OTHER): Payer: BLUE CROSS/BLUE SHIELD | Admitting: Podiatry

## 2016-12-16 ENCOUNTER — Encounter: Payer: Self-pay | Admitting: Podiatry

## 2016-12-16 ENCOUNTER — Ambulatory Visit (INDEPENDENT_AMBULATORY_CARE_PROVIDER_SITE_OTHER): Payer: BLUE CROSS/BLUE SHIELD

## 2016-12-16 DIAGNOSIS — S92354D Nondisplaced fracture of fifth metatarsal bone, right foot, subsequent encounter for fracture with routine healing: Secondary | ICD-10-CM

## 2016-12-17 ENCOUNTER — Ambulatory Visit (INDEPENDENT_AMBULATORY_CARE_PROVIDER_SITE_OTHER): Payer: BLUE CROSS/BLUE SHIELD | Admitting: Podiatry

## 2016-12-17 DIAGNOSIS — S92354D Nondisplaced fracture of fifth metatarsal bone, right foot, subsequent encounter for fracture with routine healing: Secondary | ICD-10-CM | POA: Diagnosis not present

## 2016-12-17 NOTE — Progress Notes (Signed)
   HPI: Patient is a 45 year old male presenting today for follow-up evaluation of a Jones fracture of the left foot. Date of injury February 2018. He states he is doing better.   Physical Exam: General: The patient is alert and oriented x3 in no acute distress.  Dermatology: Skin is warm, dry and supple bilateral lower extremities. Negative for open lesions or macerations.  Vascular: Palpable pedal pulses bilaterally. No edema or erythema noted. Capillary refill within normal limits.  Neurological: Epicritic and protective threshold grossly intact bilaterally.   Musculoskeletal Exam: Range of motion within normal limits to all pedal and ankle joints bilateral. Muscle strength 5/5 in all groups bilateral.   Radiographic Exam:  Transverse fracture of the fifth metatarsal left foot noted with routine healing. Nondisplaced.  Assessment: 1. Jones fracture left-routine healing   Plan of Care:  1. Patient was evaluated. X-rays reviewed. 2. Appointment with Liliane Channel for new custom orthotics. 3. Discontinue wearing cam boot. 4. Return to clinic when necessary.   Edrick Kins, DPM [facility]  Dr. Edrick Kins, [address]                                        [place], [place] [address]                Office [phone]  Fax [phone]

## 2016-12-18 NOTE — Progress Notes (Signed)
Patient comes in today for custom foot orthotics per Dr. Evans...Patient is morbidly obese (345) and has hx of non-displaced fx 5th met left.   Patient also complains of heel pain.  Plan on full length f/o with polypro shell weight appropriate, w/ 5th ray cut out in shell left, cushion entire device....1/16 spenco over 1/16th poron.   Heel punch and cushion.   Ever to fab  [id number] 

## 2017-01-07 ENCOUNTER — Ambulatory Visit: Payer: BLUE CROSS/BLUE SHIELD

## 2017-01-16 ENCOUNTER — Ambulatory Visit (INDEPENDENT_AMBULATORY_CARE_PROVIDER_SITE_OTHER): Payer: BLUE CROSS/BLUE SHIELD

## 2017-01-16 ENCOUNTER — Ambulatory Visit (INDEPENDENT_AMBULATORY_CARE_PROVIDER_SITE_OTHER): Payer: BLUE CROSS/BLUE SHIELD | Admitting: Podiatry

## 2017-01-16 ENCOUNTER — Encounter: Payer: Self-pay | Admitting: Podiatry

## 2017-01-16 DIAGNOSIS — S92355D Nondisplaced fracture of fifth metatarsal bone, left foot, subsequent encounter for fracture with routine healing: Secondary | ICD-10-CM

## 2017-01-20 NOTE — Progress Notes (Signed)
   HPI: Patient is a 46 year old male presenting today for follow-up evaluation of a Jones fracture of the left foot. Date of injury February 2018. He states he is doing better although he still experiences some mild pain. He is here to pick up his orthotics today.   Physical Exam: General: The patient is alert and oriented x3 in no acute distress.  Dermatology: Skin is warm, dry and supple bilateral lower extremities. Negative for open lesions or macerations.  Vascular: Palpable pedal pulses bilaterally. No edema or erythema noted. Capillary refill within normal limits.  Neurological: Epicritic and protective threshold grossly intact bilaterally.   Musculoskeletal Exam: Range of motion within normal limits to all pedal and ankle joints bilateral. Muscle strength 5/5 in all groups bilateral.   Radiographic Exam:  Transverse fracture of the fifth metatarsal left foot noted with routine healing. Nondisplaced.  Assessment: 1. Jones fracture left-routine healing   Plan of Care:  1. Patient was evaluated. X-rays reviewed. 2. Continue wearing orthotics with good shoe gear. 3. Return to clinic when necessary.    Edrick Kins, DPM [facility]  Dr. Edrick Kins, [address]                                        [place], [place] [address]                Office [phone]  Fax [phone]

## 2017-09-08 ENCOUNTER — Other Ambulatory Visit: Payer: Self-pay | Admitting: Podiatry

## 2017-09-08 ENCOUNTER — Encounter: Payer: Self-pay | Admitting: Podiatry

## 2017-09-08 ENCOUNTER — Ambulatory Visit (INDEPENDENT_AMBULATORY_CARE_PROVIDER_SITE_OTHER): Payer: BLUE CROSS/BLUE SHIELD

## 2017-09-08 ENCOUNTER — Ambulatory Visit (INDEPENDENT_AMBULATORY_CARE_PROVIDER_SITE_OTHER): Payer: BLUE CROSS/BLUE SHIELD | Admitting: Podiatry

## 2017-09-08 DIAGNOSIS — M7752 Other enthesopathy of left foot: Secondary | ICD-10-CM | POA: Diagnosis not present

## 2017-09-08 DIAGNOSIS — M84375A Stress fracture, left foot, initial encounter for fracture: Secondary | ICD-10-CM

## 2017-09-08 DIAGNOSIS — R52 Pain, unspecified: Secondary | ICD-10-CM

## 2017-09-08 DIAGNOSIS — M779 Enthesopathy, unspecified: Secondary | ICD-10-CM

## 2017-09-08 DIAGNOSIS — M179 Osteoarthritis of knee, unspecified: Secondary | ICD-10-CM | POA: Insufficient documentation

## 2017-09-08 DIAGNOSIS — M1710 Unilateral primary osteoarthritis, unspecified knee: Secondary | ICD-10-CM | POA: Insufficient documentation

## 2017-09-09 ENCOUNTER — Encounter: Payer: BLUE CROSS/BLUE SHIELD | Admitting: Orthotics

## 2017-09-09 ENCOUNTER — Ambulatory Visit: Payer: BLUE CROSS/BLUE SHIELD | Admitting: Orthotics

## 2017-09-09 DIAGNOSIS — M84375A Stress fracture, left foot, initial encounter for fracture: Secondary | ICD-10-CM

## 2017-09-09 DIAGNOSIS — M79671 Pain in right foot: Secondary | ICD-10-CM

## 2017-09-09 DIAGNOSIS — M79672 Pain in left foot: Secondary | ICD-10-CM

## 2017-09-09 NOTE — Progress Notes (Signed)
Adding valgus wedge extrensic to f/o

## 2017-09-10 NOTE — Progress Notes (Signed)
   HPI: 47 year old male presenting today with a chief complaint of intermittent pain to the lateral left foot that began several weeks ago. He has custom molded orthotics but state they may need modification. He has been taking Mobic for the pain. Walking and bearing weight increases the pain. Patient is here for further evaluation and treatment.    Past Medical History:  Diagnosis Date  . Adult ADHD   . Arthritis   . Barrett esophagus   . Colon polyps   . Degenerative joint disease   . GERD (gastroesophageal reflux disease)   . Hiatal hernia   . Hypertension    dr Burt Ek   [place]  . Leukocytosis   . Obesity   . Polycythemia, secondary 12/20/2014  . Sleep apnea    cpap  . Testosterone deficiency   . Undescended right testicle      Physical Exam: General: The patient is alert and oriented x3 in no acute distress.  Dermatology: Skin is warm, dry and supple bilateral lower extremities. Negative for open lesions or macerations.  Vascular: Palpable pedal pulses bilaterally. No edema or erythema noted. Capillary refill within normal limits.  Neurological: Epicritic and protective threshold grossly intact bilaterally.   Musculoskeletal Exam: Pain on palpation to the base of the 4th metatarsal. Range of motion within normal limits to all pedal and ankle joints bilateral. Muscle strength 5/5 in all groups bilateral.   Radiographic Exam:  Closed, nondisplaced stress fracture to the 4th metatarsal base.     Assessment: - stress fracture 4th metatarsal base left   Plan of Care:  - Patient evaluated. X-Rays reviewed.  - Recommended good shoe gear. 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  - Continue taking Mobic daily.  - Return to clinic as needed.    Edrick Kins, DPM [facility]  Dr. Edrick Kins, DPM    [address] [place], [place] [address]                Office [phone]    Fax [phone]

## 2018-07-27 ENCOUNTER — Other Ambulatory Visit: Payer: Self-pay | Admitting: Rehabilitative and Restorative Service Providers"

## 2018-07-27 DIAGNOSIS — I4510 Unspecified right bundle-branch block: Secondary | ICD-10-CM

## 2018-07-27 DIAGNOSIS — R0789 Other chest pain: Secondary | ICD-10-CM

## 2018-07-30 ENCOUNTER — Ambulatory Visit
Admission: RE | Admit: 2018-07-30 | Discharge: 2018-07-30 | Disposition: A | Discharge status: Home / Self Care | Payer: BLUE CROSS/BLUE SHIELD | Source: Ambulatory Visit | Attending: Rehabilitative and Restorative Service Providers" | Admitting: Rehabilitative and Restorative Service Providers"

## 2018-07-30 DIAGNOSIS — I4510 Unspecified right bundle-branch block: Secondary | ICD-10-CM | POA: Insufficient documentation

## 2018-07-30 DIAGNOSIS — R0789 Other chest pain: Secondary | ICD-10-CM

## 2018-07-30 MED ORDER — TECHNETIUM TC 99M TETROFOSMIN IV KIT
10.6900 | PACK | Freq: Once | INTRAVENOUS | Status: AC | PRN
  Administered 2018-07-30: 10.69 via INTRAVENOUS

## 2018-07-30 MED ORDER — TECHNETIUM TC 99M TETROFOSMIN IV KIT
30.0000 | PACK | Freq: Once | INTRAVENOUS | Status: AC | PRN
  Administered 2018-07-30: 30.31 via INTRAVENOUS

## 2018-08-02 LAB — NM MYOCAR MULTI W/SPECT W/WALL MOTION / EF
CHL CUP NUCLEAR SDS: 0
CHL CUP NUCLEAR SSS: 0
CSEPPHR: 169 {beats}/min
Estimated workload: 10.5 METS
Exercise duration (min): 9 min
Exercise duration (sec): 17 s
LVDIAVOL: 115 mL (ref 62–150)
LVSYSVOL: 43 mL
MPHR: 172 {beats}/min
Percent HR: 98 %
Rest HR: 79 {beats}/min
SRS: 9
TID: 0.76

## 2018-09-20 ENCOUNTER — Other Ambulatory Visit: Payer: Self-pay

## 2018-09-21 LAB — CMP12+LP+TP+TSH+6AC+PSA+CBC…
A/G RATIO: 1.8 (ref 1.2–2.2)
ALT: 37 IU/L (ref 0–44)
AST: 34 IU/L (ref 0–40)
Albumin: 4.3 g/dL (ref 4.0–5.0)
Alkaline Phosphatase: 63 IU/L (ref 39–117)
BASOS: 1 %
BUN/Creatinine Ratio: 12 (ref 9–20)
BUN: 12 mg/dL (ref 6–24)
Basophils Absolute: 0.1 10*3/uL (ref 0.0–0.2)
Bilirubin Total: 0.6 mg/dL (ref 0.0–1.2)
CALCIUM: 9.3 mg/dL (ref 8.7–10.2)
CHOL/HDL RATIO: 3.4 ratio (ref 0.0–5.0)
CREATININE: 0.99 mg/dL (ref 0.76–1.27)
Chloride: 105 mmol/L (ref 96–106)
Cholesterol, Total: 93 mg/dL — ABNORMAL LOW (ref 100–199)
EOS (ABSOLUTE): 0.2 10*3/uL (ref 0.0–0.4)
Eos: 2 %
Estimated CHD Risk: 0.5 times avg. (ref 0.0–1.0)
Free Thyroxine Index: 2.2 (ref 1.2–4.9)
GFR calc Af Amer: 104 mL/min/{1.73_m2} (ref >59)
GFR, EST NON AFRICAN AMERICAN: 90 mL/min/{1.73_m2} (ref >59)
GGT: 18 IU/L (ref 0–65)
GLUCOSE: 92 mg/dL (ref 65–99)
Globulin, Total: 2.4 g/dL (ref 1.5–4.5)
HDL: 27 mg/dL — AB (ref >39)
HEMATOCRIT: 44.6 % (ref 37.5–51.0)
Hemoglobin: 14 g/dL (ref 13.0–17.7)
IMMATURE GRANS (ABS): 0 10*3/uL (ref 0.0–0.1)
IRON: 29 ug/dL — AB (ref 38–169)
Immature Granulocytes: 0 %
LDH: 184 IU/L (ref 121–224)
LDL Calculated: 50 mg/dL (ref 0–99)
LYMPHS ABS: 2 10*3/uL (ref 0.7–3.1)
LYMPHS: 23 %
MCH: 22.4 pg — AB (ref 26.6–33.0)
MCHC: 31.4 g/dL — AB (ref 31.5–35.7)
MCV: 71 fL — AB (ref 79–97)
MONOS ABS: 0.7 10*3/uL (ref 0.1–0.9)
Monocytes: 8 %
NEUTROS ABS: 5.6 10*3/uL (ref 1.4–7.0)
Neutrophils: 66 %
PHOSPHORUS: 2.7 mg/dL — AB (ref 2.8–4.1)
POTASSIUM: 4.6 mmol/L (ref 3.5–5.2)
Platelets: 359 10*3/uL (ref 150–450)
Prostate Specific Ag, Serum: 0.4 ng/mL (ref 0.0–4.0)
RBC: 6.26 x10E6/uL — ABNORMAL HIGH (ref 4.14–5.80)
RDW: 19.3 % — AB (ref 11.6–15.4)
SODIUM: 142 mmol/L (ref 134–144)
T3 UPTAKE RATIO: 27 % (ref 24–39)
T4 TOTAL: 8.3 ug/dL (ref 4.5–12.0)
TOTAL PROTEIN: 6.7 g/dL (ref 6.0–8.5)
TSH: 2.77 u[IU]/mL (ref 0.450–4.500)
Triglycerides: 78 mg/dL (ref 0–149)
URIC ACID: 6.2 mg/dL (ref 3.7–8.6)
VLDL Cholesterol Cal: 16 mg/dL (ref 5–40)
WBC: 8.6 10*3/uL (ref 3.4–10.8)

## 2018-09-21 LAB — TESTOSTERONE,FREE AND TOTAL
TESTOSTERONE: 1027 ng/dL — AB (ref 264–916)
Testosterone, Free: 22.9 pg/mL — ABNORMAL HIGH (ref 6.8–21.5)

## 2018-09-21 LAB — FERRITIN: FERRITIN: 9 ng/mL — AB (ref 30–400)

## 2018-12-28 ENCOUNTER — Other Ambulatory Visit: Payer: Self-pay

## 2018-12-28 ENCOUNTER — Ambulatory Visit: Payer: Self-pay

## 2018-12-28 DIAGNOSIS — E291 Testicular hypofunction: Secondary | ICD-10-CM

## 2018-12-28 MED ORDER — TESTOSTERONE CYPIONATE 200 MG/ML IM SOLN
100.0000 mg | INTRAMUSCULAR | Status: AC
  Administered 2018-12-28 – 2019-12-30 (×15): 100 mg via INTRAMUSCULAR

## 2019-01-13 ENCOUNTER — Encounter: Payer: Self-pay | Admitting: Internal Medicine

## 2019-01-13 ENCOUNTER — Other Ambulatory Visit: Payer: Self-pay

## 2019-01-13 ENCOUNTER — Ambulatory Visit: Payer: 59 | Admitting: Internal Medicine

## 2019-01-13 VITALS — BP 128/83 | HR 82 | Temp 98.3°F | Resp 16 | Ht 73.0 in | Wt 328.0 lb

## 2019-01-13 DIAGNOSIS — E66813 Obesity, class 3: Secondary | ICD-10-CM

## 2019-01-13 DIAGNOSIS — E291 Testicular hypofunction: Secondary | ICD-10-CM

## 2019-01-13 DIAGNOSIS — D751 Secondary polycythemia: Secondary | ICD-10-CM

## 2019-01-13 DIAGNOSIS — R790 Abnormal level of blood mineral: Secondary | ICD-10-CM

## 2019-01-13 DIAGNOSIS — F909 Attention-deficit hyperactivity disorder, unspecified type: Secondary | ICD-10-CM

## 2019-01-13 DIAGNOSIS — I10 Essential (primary) hypertension: Secondary | ICD-10-CM

## 2019-01-13 MED ORDER — AMPHETAMINE-DEXTROAMPHET ER 20 MG PO CP24: 20.0000 mg | ORAL_CAPSULE | Freq: Every day | ORAL | 0 refills | Status: DC

## 2019-01-13 NOTE — Progress Notes (Signed)
S - Presents for renewal of ADHD medicine No SE concerns with no CP/palpitations/heart racing, no insomnia,  Still notes is helpful Has been having days without (sometimes not intentional), and encouraged Testosterone now 0.5cc every other week.  Last labs March He had a low ferritin and iron supp restored, with his counts likely not anemic due to the testosterone supplementation He has been feeling well   Current Outpatient Medications on File Prior to Visit  Medication Sig Dispense Refill  . esomeprazole (NEXIUM) 40 MG capsule Take 40 mg by mouth daily at 12 noon.    . ferrous sulfate 325 (65 FE) MG tablet Take 325 mg by mouth daily.    [name] lisinopril (PRINIVIL,ZESTRIL) 20 MG tablet Take 20 mg by mouth daily.    . meloxicam (MOBIC) 15 MG tablet Take 15 mg by mouth daily.     Current Facility-Administered Medications on File Prior to Visit  Medication Dose Route Frequency Provider Last Rate Last Dose  . 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 ADHD - tolerating stimulant medicine to help   Has signed [place] PMP reviewed Renewed medicine and aware cannot put refills on prescription Days away encouraged and periodic days without medicine can be beneficial Follow-up in person visits needed every three months, with refills possible through EHR between, should contact the office when running low  on the medicine to help generate this refill, and prn otherwise  2. Hypogonadism - on test supp and decreased in March, concern with polycythemia SE and levels were high on check  Cont the testosterone at the lower dose Recheck labs in August between doses  3. Polycythemia - Increased RBC ct noted, as above  Recheck CBC with diff in August  4. Low ferritin - as above noted, not anemic likely due to test supp  Cont the iron supp and recheck the ferritin with next lab draw  5. HTN - BP good today and cont the med   6. Obesity - weight affected in the office with the gear he wears for work. Understands the importance of weight control and trying to lose weight and the benefits of this

## 2019-02-03 ENCOUNTER — Ambulatory Visit: Payer: Self-pay

## 2019-02-03 ENCOUNTER — Other Ambulatory Visit: Payer: Self-pay

## 2019-02-03 DIAGNOSIS — E291 Testicular hypofunction: Secondary | ICD-10-CM

## 2019-02-08 DIAGNOSIS — M1711 Unilateral primary osteoarthritis, right knee: Secondary | ICD-10-CM | POA: Diagnosis not present

## 2019-02-08 DIAGNOSIS — M170 Bilateral primary osteoarthritis of knee: Secondary | ICD-10-CM | POA: Diagnosis not present

## 2019-02-09 ENCOUNTER — Other Ambulatory Visit: Payer: 59

## 2019-02-09 ENCOUNTER — Other Ambulatory Visit: Payer: Self-pay

## 2019-02-09 DIAGNOSIS — E291 Testicular hypofunction: Secondary | ICD-10-CM

## 2019-02-10 ENCOUNTER — Other Ambulatory Visit: Payer: Self-pay

## 2019-02-14 ENCOUNTER — Telehealth: Payer: Self-pay

## 2019-02-14 LAB — CBC WITH DIFFERENTIAL/PLATELET
Basophils Absolute: 0 10*3/uL (ref 0.0–0.2)
Basos: 0 %
EOS (ABSOLUTE): 0.1 10*3/uL (ref 0.0–0.4)
Eos: 1 %
Hematocrit: 51.1 % — ABNORMAL HIGH (ref 37.5–51.0)
Hemoglobin: 17 g/dL (ref 13.0–17.7)
Immature Grans (Abs): 0 10*3/uL (ref 0.0–0.1)
Immature Granulocytes: 0 %
Lymphocytes Absolute: 2 10*3/uL (ref 0.7–3.1)
Lymphs: 23 %
MCH: 27.2 pg (ref 26.6–33.0)
MCHC: 33.3 g/dL (ref 31.5–35.7)
MCV: 82 fL (ref 79–97)
Monocytes Absolute: 0.6 10*3/uL (ref 0.1–0.9)
Monocytes: 7 %
Neutrophils Absolute: 6.2 10*3/uL (ref 1.4–7.0)
Neutrophils: 69 %
Platelets: 322 10*3/uL (ref 150–450)
RBC: 6.24 x10E6/uL — ABNORMAL HIGH (ref 4.14–5.80)
RDW: 17.8 % — ABNORMAL HIGH (ref 11.6–15.4)
WBC: 9 10*3/uL (ref 3.4–10.8)

## 2019-02-14 LAB — FERRITIN: Ferritin: 66 ng/mL (ref 30–400)

## 2019-02-14 LAB — TESTOSTERONE,FREE AND TOTAL
Testosterone, Free: 10.5 pg/mL (ref 6.8–21.5)
Testosterone: 733 ng/dL (ref 264–916)

## 2019-02-14 NOTE — Progress Notes (Signed)
Jeffrey Carlson called to schedule appt to review lab results.  Scheduled appt for 08/04 at 8:15am.  AMD

## 2019-02-14 NOTE — Telephone Encounter (Signed)
Appt scheduled for 02/15/19 at 8:15am.  AMD

## 2019-02-14 NOTE — Progress Notes (Signed)
Pt coming in tomorrow morning

## 2019-02-15 ENCOUNTER — Other Ambulatory Visit: Payer: Self-pay

## 2019-02-15 ENCOUNTER — Encounter: Payer: Self-pay | Admitting: Internal Medicine

## 2019-02-15 ENCOUNTER — Ambulatory Visit: Payer: Self-pay | Admitting: Internal Medicine

## 2019-02-15 VITALS — BP 137/84 | HR 73 | Temp 98.2°F | Resp 16 | Ht 73.0 in | Wt 331.0 lb

## 2019-02-15 DIAGNOSIS — E291 Testicular hypofunction: Secondary | ICD-10-CM

## 2019-02-15 DIAGNOSIS — D751 Secondary polycythemia: Secondary | ICD-10-CM

## 2019-02-15 DIAGNOSIS — Z860100 Personal history of colon polyps, unspecified: Secondary | ICD-10-CM

## 2019-02-15 DIAGNOSIS — K219 Gastro-esophageal reflux disease without esophagitis: Secondary | ICD-10-CM

## 2019-02-15 DIAGNOSIS — D509 Iron deficiency anemia, unspecified: Secondary | ICD-10-CM

## 2019-02-15 DIAGNOSIS — F909 Attention-deficit hyperactivity disorder, unspecified type: Secondary | ICD-10-CM

## 2019-02-15 DIAGNOSIS — I10 Essential (primary) hypertension: Secondary | ICD-10-CM

## 2019-02-15 NOTE — Progress Notes (Signed)
S - f/u today at my request to discuss lab results and next best steps.  The patient currently is taking testosterone, 100 mg every other week.  He noted he has taken testosterone for over 15 years, and was started after he had a visit with the infertility clinic at [facility] with those concerns.  He was told that his testosterone level was extremely low, and supplementation was needed.  Used gel, not work well and changed to injection Over the years, he noted he has been off of testosterone for months at a time, and just does not feel as good when he is not taking the testosterone supplementation.  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  He at 1 point was having phlebotomy done to help keep his counts down.  He has not needed them in the very recent past.  On our last visit, his ferritin was very low, and iron supplementation was initiated. Noted he would possibly be anemic if not for the testosterone supp and the secondary polycythemia with this. He is not a vegetarian, and does eat red meat and thinks he gets some iron in his diet.  He also had a colonoscopy and upper endoscopy in December 2019, on New Year's Eve he noted, and everything was okay (also with GERD and ? h/o Barrett's a concern and also a h/o polyps for f/u scopes) He has had this done several times in the past as well.  He noted he expected his counts to increase some taking the iron supplement, and was not surprised when these results came back showing that.  His lab results returned with a testosterone level of 733, a free testosterone of 10.5, both better from 4 months ago when the levels were 1027 and 22.9 respectively.  His hemoglobin was 17.0 and his hematocrit was 51.1, with his RBC 6.24.  His ferritin had increased to 66.  (It was 9 previously.  Clinically, he has felt reasonably well, is a  Engineer, structural.  He thinks he has felt a little more energy since starting the iron supplement.  He does note that a lot of days, at the end of the day he does feel more fatigued.  He notes he has battled obesity for his entire life, and was told that may be a factor as well with respect to his low testosterone levels.  He noted he had a vigorous work-up with the infertility clinic at [facility] and prior to him finally being able to adopt a child years ago.  No Known Allergies Current Outpatient Medications on File Prior to Visit  Medication Sig Dispense Refill  . amphetamine-dextroamphetamine (ADDERALL XR) 20 MG 24 hr capsule Take 1 capsule (20 mg total) by mouth daily. 30 capsule 0  . esomeprazole (NEXIUM) 40 MG capsule Take 40 mg by mouth daily at 12 noon.    . ferrous sulfate 325 (65 FE) MG tablet Take 325 mg by mouth daily.    [name] lisinopril (PRINIVIL,ZESTRIL) 20 MG tablet Take 20 mg by mouth daily.    . meloxicam (MOBIC) 15 MG tablet Take 15 mg by mouth daily.     Current Facility-Administered Medications on File Prior to Visit  Medication Dose Route Frequency Provider Last Rate Last Dose  . testosterone cypionate (DEPOTESTOSTERONE CYPIONATE) injection 100 mg  100 mg Intramuscular Q14 Days Towanda Malkin, MD  100 mg at 02/03/19 1649     O - NAD, masked, obese  BP 137/84 (BP Location: Right Arm, Patient Position: Sitting, Cuff Size: Large)   Pulse 73   Temp 98.2 F (36.8 C) (Oral)   Resp 16   Ht 6\' 1"  (1.854 m)   Wt (!) 331 lb (150.1 kg)   SpO2 97%   BMI 43.67 kg/m    Affect not flat, approp with conversation  More detailed exam limited today as spent over 30 minutes with his history and discussion (had his annual physical with me in March with the paper chart notes from that reviewed today as well).  Labs as above noted  Assessment/Plan  1.  Hypogonadism - on test supp and concern was overreplaced based on levels checked recently and dose recently decreased after. Concern  contributing to secondary polycythemia from this supplementation.   Discussed at length further decreases or stopping this, and after being on supplementation for so many years after told his levels were very low prior to starting, concern expressed clinically how well he will do and the risks of just lessening or stopping this supp completely. Have recently reduced the amount, now at [facility] every other week.   Agreed to continue the supplement at the present dose Recheck test and free test in late October, with the timing important (first thing in am at mid-cycle of supplementation)  2.  Iron deficiency/low ferritin - will stop the iron supp presently and emphasized importance of dietary iron intake and can use a MVI with iron presently. Feel the risk/benefit favors this and stopping the formal supplementation. His Hgb and Hct have maintained with this due to his polycythemia.   3.  Polycythemia, most likely secondary noting the history to date, and without other symptoms of concern noted in past (pruritis, erythromelagia) and he noted prior genetic work-up was done and told not P. Vera (although P. Vanita Ingles was a diagnosis in writing documented on his problem list in the paper chart when reviewed).   Will recheck CBC with ferritin end of October  He noted he used to give blood regularly through the red cross, and encouraged him to do so in next month as think would be beneficial (lessen his counts and help with blood supply needed) Discussed may need to lessen testosterone again if counts increasing, as that is likely a major contributor to the secondary polycythemia   4. Obesity - has battled his entire life noted and aware of the benefits of continuing to try to lose weight  5. HTN hx - controlled with meds  6. ADHD history noted  7. GERD and polyp history - has had both upper and lower scopes done on several occasions in the past, with the last noted by him New Years Eve 2019 and informed were  ok. Found the EGD report which noted no Barrett's, chronic inflamm changes and rec'ed no repeat EGD in the future, did rec a f/u colonoscopy in 2024 (searching for detailed report from colonoscopy presently)

## 2019-02-17 ENCOUNTER — Other Ambulatory Visit: Payer: Self-pay | Admitting: Internal Medicine

## 2019-02-17 ENCOUNTER — Telehealth: Payer: Self-pay

## 2019-02-17 MED ORDER — AMPHETAMINE-DEXTROAMPHET ER 20 MG PO CP24: 20.0000 mg | ORAL_CAPSULE | Freq: Every day | ORAL | 0 refills | Status: DC

## 2019-02-17 NOTE — Telephone Encounter (Signed)
Can let him know was refilled.

## 2019-02-17 NOTE — Telephone Encounter (Signed)
Informed Mark Rx refilled by Dr. Roxan Hockey.  AMD

## 2019-02-17 NOTE — Progress Notes (Signed)
Refilled med requested, adderall. Was just seen this week.

## 2019-02-17 NOTE — Telephone Encounter (Signed)
Mark called & said when he was here yesterday, he forgot to tell you he needs Rx Refill for Adderall.  It was last refilled on 01/13/2019.  AMD

## 2019-03-07 ENCOUNTER — Encounter: Payer: Self-pay | Admitting: Internal Medicine

## 2019-03-07 ENCOUNTER — Ambulatory Visit: Payer: 59 | Admitting: Registered Nurse

## 2019-03-07 ENCOUNTER — Other Ambulatory Visit: Payer: Self-pay

## 2019-03-07 ENCOUNTER — Encounter

## 2019-03-07 VITALS — BP 133/83 | HR 67 | Temp 98.0°F | Resp 14 | Ht 73.0 in | Wt 331.0 lb

## 2019-03-07 DIAGNOSIS — Z6841 Body Mass Index (BMI) 40.0 and over, adult: Secondary | ICD-10-CM

## 2019-03-07 DIAGNOSIS — E291 Testicular hypofunction: Secondary | ICD-10-CM

## 2019-03-07 DIAGNOSIS — M79672 Pain in left foot: Secondary | ICD-10-CM

## 2019-03-07 DIAGNOSIS — B353 Tinea pedis: Secondary | ICD-10-CM

## 2019-03-07 NOTE — Patient Instructions (Addendum)
Schedule Labs for next week to continue your testosterone injections, labs need to be before 10AM.   Calorie Counting for Weight Loss Calories are units of energy. Your body needs a certain amount of calories from food to keep you going throughout the day. When you eat more calories than your body needs, your body stores the extra calories as fat. When you eat fewer calories than your body needs, your body burns fat to get the energy it needs. Calorie counting means keeping track of how many calories you eat and drink each day. Calorie counting can be helpful if you need to lose weight. If you make sure to eat fewer calories than your body needs, you should lose weight. Ask your health care provider what a healthy weight is for you. For calorie counting to work, you will need to eat the right number of calories in a day in order to lose a healthy amount of weight per week. A dietitian can help you determine how many calories you need in a day and will give you suggestions on how to reach your calorie goal.  A healthy amount of weight to lose per week is usually 1-2 lb (0.5-0.9 kg). This usually means that your daily calorie intake should be reduced by 500-750 calories.  Eating 1,200 - 1,500 calories per day can help most women lose weight.  Eating 1,500 - 1,800 calories per day can help most men lose weight. What is my plan? My goal is to have __________ calories per day. If I have this many calories per day, I should lose around __________ pounds per week. What do I need to know about calorie counting? In order to meet your daily calorie goal, you will need to:  Find out how many calories are in each food you would like to eat. Try to do this before you eat.  Decide how much of the food you plan to eat.  Write down what you ate and how many calories it had. Doing this is called keeping a food log. To successfully lose weight, it is important to balance calorie counting with a healthy  lifestyle that includes regular activity. Aim for 150 minutes of moderate exercise (such as walking) or 75 minutes of vigorous exercise (such as running) each week. Where do I find calorie information?  The number of calories in a food can be found on a Nutrition Facts label. If a food does not have a Nutrition Facts label, try to look up the calories online or ask your dietitian for help. Remember that calories are listed per serving. If you choose to have more than one serving of a food, you will have to multiply the calories per serving by the amount of servings you plan to eat. For example, the label on a package of bread might say that a serving size is 1 slice and that there are 90 calories in a serving. If you eat 1 slice, you will have eaten 90 calories. If you eat 2 slices, you will have eaten 180 calories. How do I keep a food log? Immediately after each meal, record the following information in your food log:  What you ate. Don't forget to include toppings, sauces, and other extras on the food.  How much you ate. This can be measured in cups, ounces, or number of items.  How many calories each food and drink had.  The total number of calories in the meal. Keep your food log near you, such as  in a small notebook in your pocket, or use a mobile app or website. Some programs will calculate calories for you and show you how many calories you have left for the day to meet your goal. What are some calorie counting tips?   Use your calories on foods and drinks that will fill you up and not leave you hungry: ? Some examples of foods that fill you up are nuts and nut butters, vegetables, lean proteins, and high-fiber foods like whole grains. High-fiber foods are foods with more than 5 g fiber per serving. ? Drinks such as sodas, specialty coffee drinks, alcohol, and juices have a lot of calories, yet do not fill you up.  Eat nutritious foods and avoid empty calories. Empty calories are  calories you get from foods or beverages that do not have many vitamins or protein, such as candy, sweets, and soda. It is better to have a nutritious high-calorie food (such as an avocado) than a food with few nutrients (such as a bag of chips).  Know how many calories are in the foods you eat most often. This will help you calculate calorie counts faster.  Pay attention to calories in drinks. Low-calorie drinks include water and unsweetened drinks.  Pay attention to nutrition labels for "low fat" or "fat free" foods. These foods sometimes have the same amount of calories or more calories than the full fat versions. They also often have added sugar, starch, or salt, to make up for flavor that was removed with the fat.  Find a way of tracking calories that works for you. Get creative. Try different apps or programs if writing down calories does not work for you. What are some portion control tips?  Know how many calories are in a serving. This will help you know how many servings of a certain food you can have.  Use a measuring cup to measure serving sizes. You could also try weighing out portions on a kitchen scale. With time, you will be able to estimate serving sizes for some foods.  Take some time to put servings of different foods on your favorite plates, bowls, and cups so you know what a serving looks like.  Try not to eat straight from a bag or box. Doing this can lead to overeating. Put the amount you would like to eat in a cup or on a plate to make sure you are eating the right portion.  Use smaller plates, glasses, and bowls to prevent overeating.  Try not to multitask (for example, watch TV or use your computer) while eating. If it is time to eat, sit down at a table and enjoy your food. This will help you to know when you are full. It will also help you to be aware of what you are eating and how much you are eating. What are tips for following this plan? Reading food  labels  Check the calorie count compared to the serving size. The serving size may be smaller than what you are used to eating.  Check the source of the calories. Make sure the food you are eating is high in vitamins and protein and low in saturated and trans fats. Shopping  Read nutrition labels while you shop. This will help you make healthy decisions before you decide to purchase your food.  Make a grocery list and stick to it. Cooking  Try to cook your favorite foods in a healthier way. For example, try baking instead of frying.  Use  low-fat dairy products. Meal planning  Use more fruits and vegetables. Half of your plate should be fruits and vegetables.  Include lean proteins like poultry and fish. How do I count calories when eating out?  Ask for smaller portion sizes.  Consider sharing an entree and sides instead of getting your own entree.  If you get your own entree, eat only half. Ask for a box at the beginning of your meal and put the rest of your entree in it so you are not tempted to eat it.  If calories are listed on the menu, choose the lower calorie options.  Choose dishes that include vegetables, fruits, whole grains, low-fat dairy products, and lean protein.  Choose items that are boiled, broiled, grilled, or steamed. Stay away from items that are buttered, battered, fried, or served with cream sauce. Items labeled "crispy" are usually fried, unless stated otherwise.  Choose water, low-fat milk, unsweetened iced tea, or other drinks without added sugar. If you want an alcoholic beverage, choose a lower calorie option such as a glass of wine or light beer.  Ask for dressings, sauces, and syrups on the side. These are usually high in calories, so you should limit the amount you eat.  If you want a salad, choose a garden salad and ask for grilled meats. Avoid extra toppings like bacon, cheese, or fried items. Ask for the dressing on the side, or ask for olive oil  and vinegar or lemon to use as dressing.  Estimate how many servings of a food you are given. For example, a serving of cooked rice is  cup or about the size of half a baseball. Knowing serving sizes will help you be aware of how much food you are eating at restaurants. The list below tells you how big or small some common portion sizes are based on everyday objects: ? 1 oz-4 stacked dice. ? 3 oz-1 deck of cards. ? 1 tsp-1 die. ? 1 Tbsp- a ping-pong ball. ? 2 Tbsp-1 ping-pong ball. ?  cup- baseball. ? 1 cup-1 baseball. Summary  Calorie counting means keeping track of how many calories you eat and drink each day. If you eat fewer calories than your body needs, you should lose weight.  A healthy amount of weight to lose per week is usually 1-2 lb (0.5-0.9 kg). This usually means reducing your daily calorie intake by 500-750 calories.  The number of calories in a food can be found on a Nutrition Facts label. If a food does not have a Nutrition Facts label, try to look up the calories online or ask your dietitian for help.  Use your calories on foods and drinks that will fill you up, and not on foods and drinks that will leave you hungry.  Use smaller plates, glasses, and bowls to prevent overeating. This inform Morton Neuralgia  Morton neuralgia is foot pain that affects the ball of the foot and the area near the toes. Morton neuralgia occurs when part of a nerve in the foot (digital nerve) is under too much pressure (compressed). When this happens over a long period of time, the nerve can thicken (neuroma) and cause pain. Pain usually occurs between the third and fourth toes.  Morton neuralgia can come and go but may get worse over time. What are the causes? This condition is caused by doing the same things over and over with your foot, such as:  Activities such as running or jumping.  Wearing shoes that are too tight.  What increases the risk? You may be at higher risk for Morton  neuralgia if you:  Are male.  Wear high heels.  Wear shoes that are narrow or tight.  Do activities that repeatedly stretch your toes, such as: ? Running. ? [place]. ? Long-distance walking. What are the signs or symptoms? The first symptom of Morton neuralgia is pain that spreads from the ball of the foot to the toes. It may feel like you are walking on a marble. Pain usually gets worse with walking and goes away at night. Other symptoms may include numbness and cramping of your toes. Both feet are equally affected, but rarely at the same time. How is this diagnosed? This condition is diagnosed based on your symptoms, your medical history, and a physical exam. Your health care provider may:  Squeeze your foot just behind your toe.  Ask you to move your toes to check for pain.  Ask about your physical activity level. You also may have imaging tests, such as an X-ray, ultrasound, or MRI. How is this treated? Treatment depends on how severe your condition is and what causes it. Treatment may involve:  Wearing different shoes that are not too tight, are low-heeled, and provide good support. For some people, this is the only treatment needed.  Wearing an over-the-counter or custom supportive pad (orthotic) under the front of your foot.  Getting injections of numbing medicine and anti-inflammatory medicine (steroid) in the nerve.  Having surgery to remove part of the thickened nerve. Follow these instructions at home: Managing pain, stiffness, and swelling   Massage your foot as needed.  Wear orthotics as told by your health care provider.  If directed, put ice on your foot: ? Put ice in a plastic bag. ? Place a towel between your skin and the bag. ? Leave the ice on for 20 minutes, 2-3 times a day.  Avoid activities that cause pain or make pain worse. If you play sports, ask your health care provider when it is safe for you to return to sports.  Raise (elevate) your foot  above the level of your heart while lying down and, when possible, while sitting. General instructions  Take over-the-counter and prescription medicines only as told by your health care provider.  Do not drive or use heavy machinery while taking prescription pain medicine.  Wear shoes that: ? Have soft soles. ? Have a wide toe area. ? Provide arch support. ? Do not pinch or squeeze your feet. ? Have room for your orthotics, if applicable.  Keep all follow-up visits as told by your health care provider. This is important. Contact a health care provider if:  Your symptoms get worse or do not get better with treatment and home care. Summary  Morton neuralgia is foot pain that affects the ball of the foot and the area near the toes. Pain usually occurs between the third and fourth toes, gets worse with walking, and goes away at night.  Morton neuralgia occurs when part of a nerve in the foot (digital nerve) is under too much pressure. When this happens over a long period of time, the nerve can thicken (neuroma) and cause pain.  This condition is caused by doing the same things over and over with your foot, such as running or jumping, wearing shoes that are too tight, or wearing high heels.  Treatment may involve wearing low-heeled shoes that are not too tight, wearing a supportive pad (orthotic) under the front of your foot, getting  injections in the nerve, or having surgery to remove part of the thickened nerve. This information is not intended to replace advice given to you by your health care provider. Make sure you discuss any questions you have with your health care provider. Document Released: 10/06/2000 Document Revised: 07/14/2017 Document Reviewed: 07/14/2017 Elsevier Patient Education  [place]. Testosterone injection What is this medicine? TESTOSTERONE (tes TOS ter one) is the main male hormone. It supports normal male development such as muscle growth, facial hair,  and deep voice. It is used in males to treat low testosterone levels. This medicine may be used for other purposes; ask your health care provider or pharmacist if you have questions. COMMON BRAND NAME(S): Andro-L.A., Aveed, Delatestryl, Depo-Testosterone, Virilon What should I tell my health care provider before I take this medicine? They need to know if you have any of these conditions:  cancer  diabetes  heart disease  kidney disease  liver disease  lung disease  prostate disease  an unusual or allergic reaction to testosterone, other medicines, foods, dyes, or preservatives  pregnant or trying to get pregnant  breast-feeding How should I use this medicine? This medicine is for injection into a muscle. It is usually given by a health care professional in a hospital or clinic setting. Contact your pediatrician regarding the use of this medicine in children. While this medicine may be prescribed for children as young as 61 years of age for selected conditions, precautions do apply. Overdosage: If you think you have taken too much of this medicine contact a poison control center or emergency room at once. NOTE: This medicine is only for you. Do not share this medicine with others. What if I miss a dose? Try not to miss a dose. Your doctor or health care professional will tell you when your next injection is due. Notify the office if you are unable to keep an appointment. What may interact with this medicine?  medicines for diabetes  medicines that treat or prevent blood clots like warfarin  oxyphenbutazone  propranolol  steroid medicines like prednisone or cortisone This list may not describe all possible interactions. Give your health care provider a list of all the medicines, herbs, non-prescription drugs, or dietary supplements you use. Also tell them if you smoke, drink alcohol, or use illegal drugs. Some items may interact with your medicine. What should I watch for  while using this medicine? Visit your doctor or health care professional for regular checks on your progress. They will need to check the level of testosterone in your blood. This medicine is only approved for use in men who have low levels of testosterone related to certain medical conditions. Heart attacks and strokes have been reported with the use of this medicine. Notify your doctor or health care professional and seek emergency treatment if you develop breathing problems; changes in vision; confusion; chest pain or chest tightness; sudden arm pain; severe, sudden headache; trouble speaking or understanding; sudden numbness or weakness of the face, arm or leg; loss of balance or coordination. Talk to your doctor about the risks and benefits of this medicine. This medicine may affect blood sugar levels. If you have diabetes, check with your doctor or health care professional before you change your diet or the dose of your diabetic medicine. Testosterone injections are not commonly used in women. Women should inform their doctor if they wish to become pregnant or think they might be pregnant. There is a potential for serious side effects to  an unborn child. Talk to your health care professional or pharmacist for more information. Talk with your doctor or health care professional about your birth control options while taking this medicine. This drug is banned from use in athletes by most athletic organizations. What side effects may I notice from receiving this medicine? Side effects that you should report to your doctor or health care professional as soon as possible:  allergic reactions like skin rash, itching or hives, swelling of the face, lips, or tongue  breast enlargement  breathing problems  changes in emotions or moods  deep or hoarse voice  irregular menstrual periods  signs and symptoms of liver injury like dark yellow or brown urine; general ill feeling or flu-like symptoms;  light-colored stools; loss of appetite; nausea; right upper belly pain; unusually weak or tired; yellowing of the eyes or skin  stomach pain  swelling of the ankles, feet, hands  too frequent or persistent erections  trouble passing urine or change in the amount of urine Side effects that usually do not require medical attention (report to your doctor or health care professional if they continue or are bothersome):  acne  change in sex drive or performance  facial hair growth  hair loss  headache This list may not describe all possible side effects. Call your doctor for medical advice about side effects. You may report side effects to FDA at 1-800-FDA-1088. Where should I keep my medicine? Keep out of the reach of children. This medicine can be abused. Keep your medicine in a safe place to protect it from theft. Do not share this medicine with anyone. Selling or giving away this medicine is dangerous and against the law. Store at room temperature between 20 and 25 degrees C (68 and 77 degrees F). Do not freeze. Protect from light. Follow the directions for the product you are prescribed. Throw away any unused medicine after the expiration date. NOTE: This sheet is a summary. It may not cover all possible information. If you have questions about this medicine, talk to your doctor, pharmacist, or health care provider.  2020 Elsevier/Gold Standard (2015-08-04 07:33:55)  Plantar Warts Warts are small growths on the skin. When they occur on the underside (sole) of the foot, they are called plantar warts. Plantar warts often occur in groups, with several small warts around a larger wart. They tend to develop on the heel or the ball of the foot. They may grow into the deeper layers of skin or rise above the surface of the skin. Most warts are not painful, and they usually do not cause problems. However, plantar warts may cause pain when you walk because pressure is applied to them. Plantar  warts may spread to other areas of the sole. They can also spread to other areas of the body through direct and indirect contact. Warts often go away on their own in time. Various treatments may be done if needed or desired. What are the causes? Plantar warts are caused by a type of virus that is called human papillomavirus (HPV).  Walking barefoot can cause exposure to the virus, especially if your feet are wet.  HPV attacks a break in the skin of the foot. What increases the risk? You are more likely to develop this condition if you:  Are between 26-46 years of age.  Use public showers or locker rooms.  Have a weakened body defense system (immune system). What are the signs or symptoms? Common symptoms of this condition include:  Flat or slightly raised growths that have a rough surface and look similar to a callus.  Pain when you use your foot to support your body weight. How is this diagnosed? A plantar wart can usually be diagnosed from its appearance. In some cases, a tissue sample may be removed (biopsy) to be looked at under a microscope. How is this treated? In many cases, warts do not need treatment. Without treatment, they often go away with time. If treatment is needed or desired, options may include:  Applying medicated solutions, creams, or patches to the wart. These may be over-the-counter or prescription medicines that make the skin soft so that layers will gradually shed away. In many cases, the medicine is applied one or two times a day and covered with a bandage.  Freezing the wart with liquid nitrogen (cryotherapy).  Burning the wart with: ? Laser treatment. ? An electrified probe (electrocautery).  Injecting a medicine (Candida antigen) into the wart to help the body's immune system fight off the wart.  Having surgery to remove the wart.  Putting duct tape over the top of the wart (occlusion). You will leave the tape in place for as long as told by your  health care provider, and then you will replace it with a new strip of tape. This is done until the wart goes away. Repeat treatment may be needed if you choose to remove warts. Warts sometimes go away and come back again. Follow these instructions at home:  Apply medicated creams or solutions only as told by your health care provider. This may involve: ? Soaking the affected area in warm water. ? Removing the top layer of softened skin before you apply the medicine. A pumice stone works well for removing the skin. ? Applying a bandage over the affected area after you apply the medicine. ? Repeating the process daily or as told by your health care provider.  Do not scratch or pick at a wart.  Wash your hands after you touch a wart.  If a wart is painful, try covering it with a bandage that has a hole in the middle. This helps to take pressure off the wart.  Keep all follow-up visits as told by your health care provider. This is important. How is this prevented? Take these actions to help prevent warts:  Wear shoes and socks. Change your socks daily.  Keep your feet clean and dry.  Do not walk barefoot in shared locker rooms, shower areas, or swimming pools.  Check your feet regularly.  Avoid direct contact with warts on other people. Contact a health care provider if:  Your warts do not improve after treatment.  You have redness, swelling, or pain at the site of a wart.  You have bleeding from a wart that does not stop with light pressure.  You have diabetes and you develop a wart. Summary  Warts are small growths on the skin. When they occur on the underside (sole) of the foot, they are called plantar warts.  In many cases, warts do not need treatment. Without treatment, they often go away with time.  Apply medicated creams or solutions only as told by your health care provider.  Do not scratch or pick at a wart. Wash your hands after you touch a wart.  Keep all  follow-up visits as told by your health care provider. This is important. This information is not intended to replace advice given to you by your health care provider. Make sure you  discuss any questions you have with your health care provider. Document Released: 09/20/2003 Document Revised: 01/26/2018 Document Reviewed: 01/26/2018 Elsevier Patient Education  [place] Pain Many things can cause foot pain. Some common causes are:  An injury.  A sprain.  Arthritis.  Blisters.  Bunions. Follow these instructions at home: Managing pain, stiffness, and swelling If directed, put ice on the painful area:  Put ice in a plastic bag.  Place a towel between your skin and the bag.  Leave the ice on for 20 minutes, 2-3 times a day.  Activity  Do not stand or walk for long periods.  Return to your normal activities as told by your health care provider. Ask your health care provider what activities are safe for you.  Do stretches to relieve foot pain and stiffness as told by your health care provider.  Do not lift anything that is heavier than 10 lb (4.5 kg), or the limit that you are told, until your health care provider says that it is safe. Lifting a lot of weight can put added pressure on your feet. Lifestyle  Wear comfortable, supportive shoes that fit you well. Do not wear high heels.  Keep your feet clean and dry. General instructions  Take over-the-counter and prescription medicines only as told by your health care provider.  Rub your foot gently.  Pay attention to any changes in your symptoms.  Keep all follow-up visits as told by your health care provider. This is important. Contact a health care provider if:  Your pain does not get better after a few days of self-care.  Your pain gets worse.  You cannot stand on your foot. Get help right away if:  Your foot is numb or tingling.  Your foot or toes are swollen.  Your foot or toes turn white or  blue.  You have warmth and redness along your foot. Summary  Common causes of foot pain are injury, sprain, arthritis, blisters or bunions.  Ice, medicines, and comfortable shoes may help foot pain.  Contact your health care provider if your pain does not get better after a few days of self-care. This information is not intended to replace advice given to you by your health care provider. Make sure you discuss any questions you have with your health care provider. Document Released: 07/27/2015 Document Revised: 04/15/2018 Document Reviewed: 04/15/2018 Elsevier Patient Education  [place]  Athlete's foot (tinea pedis) is a fungal infection of the skin on your feet. It often occurs on the skin that is between or underneath the toes. It can also occur on the soles of your feet. The infection can spread from person to person (is contagious). It can also spread when a person's bare feet come in contact with the fungus on shower floors or on items such as shoes. What are the causes? This condition is caused by a fungus that grows in warm, moist places. You can get athlete's foot by sharing shoes, shower stalls, towels, and wet floors with someone who is infected. Not washing your feet or changing your socks often enough can also lead to athlete's foot. What increases the risk? This condition is more likely to develop in:  Men.  People who have a weak body defense system (immune system).  People who have diabetes.  People who use public showers, such as at a gym.  People who wear heavy-duty shoes, such as Environmental manager.  Seasons with warm, humid  weather. What are the signs or symptoms? Symptoms of this condition include:  Itchy areas between your toes or on the soles of your feet.  White, flaky, or scaly areas between your toes or on the soles of your feet.  Very itchy small blisters between your toes or on the soles of your feet.  Small  cuts in your skin. These cuts can become infected.  Thick or discolored toenails. How is this diagnosed? This condition may be diagnosed with a physical exam and a review of your medical history. Your health care provider may also take a skin or toenail sample to examine under a microscope. How is this treated? This condition is treated with antifungal medicines. These may be applied as powders, ointments, or creams. In severe cases, an oral antifungal medicine may be given. Follow these instructions at home: Medicines  Apply or take over-the-counter and prescription medicines only as told by your health care provider.  Apply your antifungal medicine as told by your health care provider. Do not stop using the antifungal even if your condition improves. Foot care  Do not scratch your feet.  Keep your feet dry: ? Wear cotton or wool socks. Change your socks every day or if they become wet. ? Wear shoes that allow air to flow, such as sandals or canvas tennis shoes.  Wash and dry your feet, including the area between your toes. Also, wash and dry your feet: ? Every day or as told by your health care provider. ? After exercising. General instructions  Do not let others use towels, shoes, nail clippers, or other personal items that touch your feet.  Protect your feet by wearing sandals in wet areas, such as locker rooms and shared showers.  Keep all follow-up visits as told by your health care provider. This is important.  If you have diabetes, keep your blood sugar under control. Contact a health care provider if:  You have a fever.  You have swelling, soreness, warmth, or redness in your foot.  Your feet are not getting better with treatment.  Your symptoms get worse.  You have new symptoms. Summary  Athlete's foot (tinea pedis) is a fungal infection of the skin on your feet. It often occurs on skin that is between or underneath the toes.  This condition is caused by a  fungus that grows in warm, moist places.  Symptoms include white, flaky, or scaly areas between your toes or on the soles of your feet.  This condition is treated with antifungal medicines.  Keep your feet clean. Always dry them thoroughly. This information is not intended to replace advice given to you by your health care provider. Make sure you discuss any questions you have with your health care provider. Document Released: 06/27/2000 Document Revised: 06/25/2017 Document Reviewed: 04/20/2017 Elsevier Patient Education  2020 [place] is not intended to replace advice given to you by your health care provider. Make sure you discuss any questions you have with your health care provider. Document Released: 06/30/2005 Document Revised: 03/19/2018 Document Reviewed: 05/30/2016 Elsevier Patient Education  2020 [employer].  Stress Fracture  A stress fracture is a small break or crack in a bone. A stress fracture can be fully broken (complete) or partially broken (incomplete). The most common sites for stress fractures are the bones in the front of your feet (metatarsals), your heel (calcaneus), and the long bone of your lower leg (tibia). What are the causes? This condition is caused by overuse or  repetitive exercise, such as running. It happens when a bone cannot absorb any more shock because the muscles around it are weak. Stress fractures happen most commonly when:  You rapidly increase or start a new physical activity.  You use shoes that are worn out or do not fit properly.  You exercise on a new surface. What increases the risk? You are more likely to develop this condition if:  You have a condition that causes weak bones (osteoporosis).  You are male. Stress fractures are more likely to occur in women. What are the signs or symptoms? The most common symptom of a stress fracture is feeling pain when you are using or putting weight on the affected part of your body.  The pain usually improves when you are resting. Other symptoms may include:  Swelling of the affected area.  Pain in the area when it is touched. Stress fracture pain usually develops over time. How is this diagnosed? This condition may be diagnosed by:  Your symptoms.  Your medical history.  A physical exam.  Imaging tests, such as: ? X-rays. ? MRI. ? Bone scan. How is this treated? Treatment depends on the severity of your stress fracture. It is commonly treated with resting, icing, compression, and elevation (RICE therapy). Treatment may also include:  Medicines to reduce inflammation.  A cast or a walking shoe.  Crutches.  Surgery. This is usually only in severe cases. Follow these instructions at home: If you have a cast:  Do not put pressure on any part of the cast until it is fully hardened. This may take several hours.  Do not stick anything inside the cast to scratch your skin. Doing that increases your risk of infection.  Check the skin around the cast every day. Tell your health care provider about any concerns.  You may put lotion on dry skin around the edges of the cast. Do not put lotion on the skin underneath the cast.  Keep the cast clean.  If the cast is not waterproof: ? Do not let it get wet. ? Cover it with a watertight covering when you take a bath or a shower. If you have a walking shoe:  Wear the shoe as told by your health care provider. Remove it only as told by your health care provider.  Loosen the shoe if your toes tingle, become numb, or turn cold and blue.  Keep the shoe clean.  If the shoe is not waterproof: ? Do not let it get wet. Managing pain, stiffness, and swelling   If directed, apply ice to the injured area: ? If you have a walking shoe, remove the shoe as told by your health care provider. ? Put ice in a plastic bag. ? Place a towel between your skin and the bag or between your cast and the bag. ? Leave the ice on  for 20 minutes, 2-3 times per day.  Move your toes often to avoid stiffness and to lessen swelling.  Raise (elevate) the injured area above the level of your heart while you are sitting or lying down. Activity  Rest as directed by your health care provider. Ask your health care provider if you may do alternative exercises, such as swimming or biking, while you are healing.  Return to your normal activities as directed by your health care provider. Ask your health care provider what activities are safe for you.  Perform range-of-motion exercises only as directed by your health care provider. General instructions  Do not use the injured limb to support yourbody weight until your health care provider says that you can. Use crutches if your health care provider tells you to do so.  Do not use any products that contain nicotine or tobacco, such as cigarettes and e-cigarettes. These can delay bone healing. If you need help quitting, ask your health care provider.  Take over-the-counter and prescription medicines only as told by your health care provider.  Keep all follow-up visits as told by your health care provider. This is important. How is this prevented?  Only wear shoes that: ? Fit well. ? Are not worn out.  Eat a healthy diet that contains vitamin D and calcium. This helps keep your bones strong. Good sources of calcium and vitamin D include: ? Low-fat dairy products such as milk, yogurt, and cheese. ? Certain fish, such as fresh or canned salmon, tuna, and sardines. ? Products that have calcium and vitamin D added to them (fortified products), such as fortified cereals or juice.  Be careful when you start a new physical activity. Give your body time to adjust.  Avoid doing only one kind of activity. Do different exercises, such as swimming and running, so that no single part of your body gets overused.  Do strength-training exercises. Contact a health care provider if:  Your  pain gets worse.  You have new symptoms.  You have increased swelling. Get help right away if:  You lose feeling in the injured area. Summary  A stress fracture is a small break or crack in a bone. A stress fracture can be fully broken (complete) or partially broken (incomplete).  This condition is caused by overuse or repetitive exercise, such as running.  The most common symptom of a stress fracture is feeling pain when you are using or putting weight on the affected part of your body.  Treatment depends on the severity of your stress fracture. This information is not intended to replace advice given to you by your health care provider. Make sure you discuss any questions you have with your health care provider. Document Released: 09/20/2002 Document Revised: 08/11/2017 Document Reviewed: 08/11/2017 Elsevier Patient Education  2020 [employer].

## 2019-03-07 NOTE — Progress Notes (Signed)
Subjective:    Patient ID: Jeffrey Carlson, male    DOB: [birth year], 48 y.o.   MRN: [id number]  48y/o caucasian male married established patient here for left foot pain localized to near 4th toe worst 6/10 to zero when at rest.  Denied history of warts plantar.  Has been treating callouses on feet with cheese grater and cream to dissolve callouses.  Had increased his exercise recently to lose weight.  Worsening his knee pain seeing orthopedics and instructed to start knee injections and change to riding bike.  Denied fever/chills/swelling/discharge/rash/bruising.  Hx fracture mid foot in the past per patient.  Also here for testosterone injection 0.5cc due IM every other week per Dr Roxan Hockey note 02/15/2019 for hypogonadism therapy chronic x 15 years labs due in Oct 2020     Review of Systems  Constitutional: Negative for activity change, appetite change, chills, diaphoresis, fatigue and fever.  HENT: Negative for trouble swallowing and voice change.   Eyes: Negative for photophobia and visual disturbance.  Respiratory: Negative for cough, shortness of breath, wheezing and stridor.   Gastrointestinal: Negative for diarrhea, nausea and vomiting.  Endocrine: Negative for cold intolerance and heat intolerance.  Genitourinary: Negative for difficulty urinating.  Musculoskeletal: Positive for arthralgias and myalgias. Negative for gait problem, joint swelling, neck pain and neck stiffness.  Skin: Negative for color change, pallor, rash and wound.  Allergic/Immunologic: Negative for food allergies.  Neurological: Negative for dizziness, tremors, seizures, syncope, facial asymmetry, speech difficulty, weakness, light-headedness, numbness and headaches.  Psychiatric/Behavioral: Negative for agitation, confusion and sleep disturbance.       Objective:   Physical Exam Vitals signs reviewed.  Constitutional:      General: He is awake. He is not in acute distress.    Appearance: Normal  appearance. He is well-developed and well-groomed. He is obese. He is not ill-appearing, toxic-appearing or diaphoretic.  HENT:     Head: Normocephalic and atraumatic.     Jaw: There is normal jaw occlusion.     Right Ear: Hearing and external ear normal.     Left Ear: Hearing and external ear normal.     Nose: Nose normal.     Mouth/Throat:     Lips: Pink. No lesions.     Mouth: Mucous membranes are moist.     Pharynx: Oropharynx is clear. Uvula midline.     Comments: Wearing cloth mask due to covid 19 pandemic Eyes:     General: Lids are normal. No visual field deficit or scleral icterus.       Right eye: No discharge.        Left eye: No discharge.     Extraocular Movements: Extraocular movements intact.     Conjunctiva/sclera: Conjunctivae normal.     Pupils: Pupils are equal, round, and reactive to light.  Neck:     Musculoskeletal: Normal range of motion and neck supple. Normal range of motion. No edema, erythema, neck rigidity, crepitus, injury, torticollis or muscular tenderness.     Vascular: No carotid bruit.     Trachea: Trachea and phonation normal.  Cardiovascular:     Rate and Rhythm: Normal rate and regular rhythm.     Pulses:          Dorsalis pedis pulses are 2+ on the left side.       Posterior tibial pulses are 2+ on the right side and 2+ on the left side.     Heart sounds: Normal heart sounds.  Pulmonary:  Effort: Pulmonary effort is normal. No respiratory distress.     Breath sounds: Normal breath sounds and air entry. No stridor, decreased air movement or transmitted upper airway sounds. No decreased breath sounds, wheezing, rhonchi or rales.     Comments: No cough observed in exam room; spoke full sentences without difficulty Abdominal:     General: Abdomen is flat.     Palpations: Abdomen is soft.  Musculoskeletal: Normal range of motion.        General: Tenderness present. No swelling, deformity or signs of injury.     Right shoulder: Normal.      Left shoulder: Normal.     Right elbow: Normal.    Left elbow: Normal.     Right hip: Normal.     Left hip: Normal.     Right knee: Normal.     Left knee: Normal.     Right ankle: Normal.     Left ankle: Normal.     Cervical back: Normal.     Thoracic back: Normal.     Lumbar back: Normal.     Right hand: Normal.     Left hand: Normal.     Right lower leg: No edema.     Left lower leg: No edema.     Right foot: Normal.     Left foot: Normal range of motion and normal capillary refill. Tenderness present. No bony tenderness, swelling, crepitus, deformity, laceration, bunion, Charcot foot, foot drop or prominent metatarsal heads.       Feet:  Feet:     Left foot:     Skin integrity: Callus and dry skin present. No ulcer, blister, skin breakdown, erythema, warmth or fissure.     Comments: Plantar left Distal to 4th MCP TTP approximately 1cm diamter with soft and deep palpation; thick callouses and gross scale bilateral feet; lateral heel linear grouped papules dry with slight erthymea; "mocassin feet" Lymphadenopathy:     Head:     Right side of head: No submental, submandibular, preauricular, posterior auricular or occipital adenopathy.     Left side of head: No submental, submandibular, preauricular, posterior auricular or occipital adenopathy.     Cervical: No cervical adenopathy.     Right cervical: No superficial cervical adenopathy.    Left cervical: No superficial cervical adenopathy.  Skin:    General: Skin is warm and dry.     Capillary Refill: Capillary refill takes less than 2 seconds.     Coloration: Skin is not ashen, cyanotic, jaundiced, mottled, pale or sallow.     Findings: Abrasion and rash present. No abscess, acne, bruising, burn, ecchymosis, erythema, signs of injury, laceration, lesion, petechiae or wound. Rash is macular, papular and scaling. Rash is not crusting, nodular, purpuric, pustular, urticarial or vesicular.     Nails: There is no clubbing.         Neurological:     General: No focal deficit present.     Mental Status: He is alert and oriented to person, place, and time. Mental status is at baseline.     GCS: GCS eye subscore is 4. GCS verbal subscore is 5. GCS motor subscore is 6.     Cranial Nerves: Cranial nerves are intact. No cranial nerve deficit, dysarthria or facial asymmetry.     Sensory: Sensation is intact. No sensory deficit.     Motor: Motor function is intact. No weakness, tremor, atrophy, abnormal muscle tone or seizure activity.     Coordination: Coordination is intact. Coordination  normal.     Gait: Gait is intact. Gait normal.     Comments: Gait sure and steady; in/out of chair and on/off exam table without dificulty  Psychiatric:        Mood and Affect: Mood normal.        Speech: Speech normal.        Behavior: Behavior normal. Behavior is cooperative.        Thought Content: Thought content normal.        Judgment: Judgment normal.           Assessment & Plan:  A-hypogonadism chronic, acute left foot pain, tinea pedis, obesity  P-Testosterone IM 0.5cc today in clinic.  Patient due follow up labs Oct 2020.  Exitcare handout on hypogonadism.  Patient verbalized understanding information/instructions, agreed with plan of care and had no further questions at this time.  Discussed differentials with patient today e.g. morton's neuroma, stress fracture, plantar wart.  Left foot xray ambulatory at [facility] facility today will call him with results [phone] may leave message per patient.  If morton's neuroma or stress fracture will send to orthopedics already established or podiatry.  Has mobic 15mg  po daily at home for pain.  Avoid high impact activities/prolonged walking.  Patient already switching from walking to biking due to knee issues. If no fracture/neuroma may be plantar wart under callous and I would shave down area later this week in clinic to see if pain relief.  Plantar warts can require retreatment  with shaving/excision and salacytic acid topical application or cryogenics with liquid nitrogen to kill virus. Exitcare handout on morton's neuroma, plantar wart and stress fracture.  Patient verbalized understanding information/instructions, agreed with plan of care and had no further questions at this time.  Encouraged weight loss low impact activities and diet modification.  Exitcare obesity/counting calories for weight loss.  Patient verbalized understanding information/instructions, agreed with plan of care and had no further questions at this time.  Discussed rotating shoes, starting antifungal foot powder, putting shoes in sun after wearing to dry out.  Consider lamisil if no improvement.  Changing socks mid day if soaked during hot months.  Medication as directed OTC antifungal foot powder or spray daily. Call or return to clinic as needed if these symptoms worsen or fail to improve as anticipated. Exitcare handout on tinea pedis printed given to patient. Discussed hygiene with patient e.g. do not stay in sweat soaked clothes  Reoccurrence of condition common and may require retreatment. Patient  verbalized agreement and understanding of treatment plan and had no further questions at this time. P2: Avoidance and hand washing.

## 2019-03-11 ENCOUNTER — Ambulatory Visit
Admission: RE | Admit: 2019-03-11 | Discharge: 2019-03-11 | Disposition: A | Discharge status: Home / Self Care | Payer: 59 | Source: Ambulatory Visit | Attending: Registered Nurse | Admitting: Registered Nurse

## 2019-03-11 ENCOUNTER — Other Ambulatory Visit: Payer: Self-pay

## 2019-03-11 DIAGNOSIS — M79672 Pain in left foot: Secondary | ICD-10-CM

## 2019-03-14 ENCOUNTER — Other Ambulatory Visit: Payer: Self-pay

## 2019-03-14 ENCOUNTER — Other Ambulatory Visit: Payer: 59

## 2019-03-14 MED ORDER — LISINOPRIL 20 MG PO TABS: 20.0000 mg | ORAL_TABLET | Freq: Every day | ORAL | 3 refills | Status: DC

## 2019-03-14 NOTE — Addendum Note (Signed)
Addended by: Virgilio Frees on: 03/14/2019 08:17 AM   Modules accepted: Orders

## 2019-03-16 ENCOUNTER — Telehealth: Payer: Self-pay | Admitting: Internal Medicine

## 2019-03-16 LAB — CBC WITH DIFFERENTIAL/PLATELET
Basophils Absolute: 0.1 10*3/uL (ref 0.0–0.2)
Basos: 1 %
EOS (ABSOLUTE): 0.1 10*3/uL (ref 0.0–0.4)
Eos: 1 %
Hematocrit: 47.8 % (ref 37.5–51.0)
Hemoglobin: 16.1 g/dL (ref 13.0–17.7)
Immature Grans (Abs): 0.1 10*3/uL (ref 0.0–0.1)
Immature Granulocytes: 1 %
Lymphocytes Absolute: 2.2 10*3/uL (ref 0.7–3.1)
Lymphs: 21 %
MCH: 27.5 pg (ref 26.6–33.0)
MCHC: 33.7 g/dL (ref 31.5–35.7)
MCV: 82 fL (ref 79–97)
Monocytes Absolute: 0.6 10*3/uL (ref 0.1–0.9)
Monocytes: 6 %
Neutrophils Absolute: 7.6 10*3/uL — ABNORMAL HIGH (ref 1.4–7.0)
Neutrophils: 70 %
Platelets: 310 10*3/uL (ref 150–450)
RBC: 5.85 x10E6/uL — ABNORMAL HIGH (ref 4.14–5.80)
RDW: 15.1 % (ref 11.6–15.4)
WBC: 10.6 10*3/uL (ref 3.4–10.8)

## 2019-03-16 LAB — FERRITIN: Ferritin: 42 ng/mL (ref 30–400)

## 2019-03-16 LAB — TESTOSTERONE,FREE AND TOTAL
Testosterone, Free: 13.1 pg/mL (ref 6.8–21.5)
Testosterone: 806 ng/dL (ref 264–916)

## 2019-03-16 NOTE — Telephone Encounter (Signed)
Spoke with pt he states that went to give blood as advised and the procedure has changed its digital and since "some" blood made it into the bag he couldn't be restuck and he cant give blood for 8 weeks.  He is advised we aren't doing phlebotomy on a regular basis in conjunction with testosterone injections. He states that he was going [facility] center [facility] phlebotomy before getting them [facility]. He has polycythemia and iron deficiency which is why he initially started getting the procedure done.   If he needs to return to the cancer center for management and phlebotomy he would need a referral   He would like to know about his recent lab results as they havent been sent to his mychart yet.

## 2019-03-16 NOTE — Telephone Encounter (Signed)
Wants to talk to dr about a phlebotomy. He tried to give blood yesterday but it wouldn't come out.

## 2019-03-16 NOTE — Telephone Encounter (Signed)
Please let patient know I sent a message to him with the recent results in [place].

## 2019-03-17 NOTE — Telephone Encounter (Signed)
Left message to call back and schedule labs in 3 months....CBC, ferritin, and total and free testosterone

## 2019-03-22 NOTE — Progress Notes (Signed)
See phone encounter.

## 2019-03-23 NOTE — Telephone Encounter (Signed)
Spoke with pt he will call back to schedule.

## 2019-04-11 ENCOUNTER — Other Ambulatory Visit: Payer: Self-pay

## 2019-04-11 MED ORDER — TESTOSTERONE CYPIONATE 200 MG/ML IJ SOLN: 100.0000 mg | INTRAMUSCULAR | 0 refills | Status: DC

## 2019-04-12 ENCOUNTER — Other Ambulatory Visit: Payer: Self-pay | Admitting: Internal Medicine

## 2019-04-12 DIAGNOSIS — F988 Other specified behavioral and emotional disorders with onset usually occurring in childhood and adolescence: Secondary | ICD-10-CM

## 2019-04-12 MED ORDER — AMPHETAMINE-DEXTROAMPHET ER 20 MG PO CP24: 20.0000 mg | ORAL_CAPSULE | Freq: Every day | ORAL | 0 refills | Status: DC

## 2019-04-12 NOTE — Telephone Encounter (Signed)
Needs refill for his adderall. He was last seen on 03-07-19

## 2019-04-22 ENCOUNTER — Other Ambulatory Visit: Payer: Self-pay

## 2019-04-22 ENCOUNTER — Ambulatory Visit: Payer: 59

## 2019-04-22 DIAGNOSIS — Z23 Encounter for immunization: Secondary | ICD-10-CM

## 2019-05-05 ENCOUNTER — Ambulatory Visit: Payer: 59 | Admitting: Physician Assistant

## 2019-05-05 ENCOUNTER — Encounter: Payer: Self-pay | Admitting: Physician Assistant

## 2019-05-05 ENCOUNTER — Other Ambulatory Visit: Payer: Self-pay

## 2019-05-05 VITALS — BP 121/86 | HR 93 | Temp 98.9°F | Resp 16 | Ht 73.0 in | Wt 325.0 lb

## 2019-05-05 DIAGNOSIS — R3915 Urgency of urination: Secondary | ICD-10-CM

## 2019-05-05 DIAGNOSIS — G8929 Other chronic pain: Secondary | ICD-10-CM

## 2019-05-05 DIAGNOSIS — R300 Dysuria: Secondary | ICD-10-CM

## 2019-05-05 DIAGNOSIS — N3090 Cystitis, unspecified without hematuria: Secondary | ICD-10-CM

## 2019-05-05 DIAGNOSIS — E291 Testicular hypofunction: Secondary | ICD-10-CM

## 2019-05-05 DIAGNOSIS — R319 Hematuria, unspecified: Secondary | ICD-10-CM

## 2019-05-05 DIAGNOSIS — M545 Low back pain: Secondary | ICD-10-CM

## 2019-05-05 LAB — POCT URINALYSIS DIPSTICK
Bilirubin, UA: NEGATIVE
Blood, UA: POSITIVE
Glucose, UA: NEGATIVE
Ketones, UA: NEGATIVE
Nitrite, UA: NEGATIVE
Protein, UA: NEGATIVE
Spec Grav, UA: 1.015 (ref 1.010–1.025)
Urobilinogen, UA: 0.2 E.U./dL
pH, UA: 7 (ref 5.0–8.0)

## 2019-05-05 MED ORDER — MELOXICAM 15 MG PO TABS: 15.0000 mg | ORAL_TABLET | Freq: Every day | ORAL | 0 refills | Status: DC

## 2019-05-05 MED ORDER — CIPROFLOXACIN HCL 500 MG PO TABS: 500.0000 mg | ORAL_TABLET | Freq: Two times a day (BID) | ORAL | 1 refills | Status: AC

## 2019-05-05 NOTE — Progress Notes (Signed)
Woke up this morning with burning sensation with urination & hesitancy in starting a stream. Going to bathroom more frequently.  Rx for Meloxicam expired & requesting a Rx refill.  AMD

## 2019-05-05 NOTE — Progress Notes (Signed)
Subjective:     Patient ID: Jeffrey Carlson, male   DOB: [birth year], 48 y.o.   MRN: [id number]  HPI  48 yo M awoke with dysuria, frequency, urgency. Denies fever, malaise, abdominal or back pain. Has hx of prostatitis and stones in the past. Initiation of stream causes tenderness today. Denies penile discharge. Denies exposure or concerns re STD. Wife is reportedly asymptommatic  Also known hypogonadism supported with testosterone which was scheduled for today  Has chronic back pain and uses Meloxicam 15 mg qd with good comfort level.   Review of Systems As noted above VS reviewed    Objective:   Physical Exam Obese adult male Afebrile alert denies malaise No CVAT , denies pelvic pressure or pain; denies rectal or perineal  discomfort- exam deferred HIs "usual" low back discomfort reported as unchanged    Assessment:    Hematuria Urinary tract infection Hypogonadism Low back pain ,chronic    Plan:    He initially defers Bactrim ( mother had ICU expierence with same)  though chart review reveals he has taken in the past.   Urinalysis is submitted for C&S-the weekend will delay results Ciprofloxacin 500mg  1 tab  BID for 5 days with 1 refill-  Contact office for results C&S on Monday and if indicated to refill Rx for a full 10 day therapy. Encourage hydration.   Testosterone injection given as scheduled. Well tolerated  Meloxicam 15 mg QD ordered- Rx was limited to 30 days by system . He has had 90 in the past and will need to contact us for additional     PCP:No primary care provider on file. Chief Complaint  Patient presents with  . Urinary Tract Infection    C/O burning with urination  . Injections    Testosterone  . Covid Screening    Negative  . Medication Refill    Meloxicam    Current Issues:  Presents with1 days of dysuria, frequency  Associated symptoms include:    History of similar symptoms. Sexually active:  Yes with male.   No concern for  STI.  Prior to Admission medications   Medication Sig Start Date End Date Taking? Authorizing Provider  amphetamine-dextroamphetamine (ADDERALL XR) 20 MG 24 hr capsule Take 1 capsule (20 mg total) by mouth daily. 04/12/19  Yes Towanda Malkin, MD  esomeprazole (NEXIUM) 40 MG capsule Take 40 mg by mouth daily at 12 noon.   Yes [provider]  lisinopril (ZESTRIL) 20 MG tablet Take 1 tablet (20 mg total) by mouth daily. 03/14/19  Yes Towanda Malkin, MD  meloxicam (MOBIC) 15 MG tablet Take 1 tablet (15 mg total) by mouth daily. 05/05/19 06/04/19 Yes Jan Fireman, PA-C  Testosterone Cypionate 200 MG/ML SOLN Inject 100 mg as directed every 14 (fourteen) days. 04/11/19  Yes Jan Fireman, PA-C  ciprofloxacin (CIPRO) 500 MG tablet Take 1 tablet (500 mg total) by mouth 2 (two) times daily for 5 days. 05/05/19 05/10/19  Jan Fireman, PA-C  ferrous sulfate 325 (65 FE) MG tablet Take 325 mg by mouth daily. 10/05/18   [provider]    Review of Systems  PE:  BP 121/86 (BP Location: Left Arm, Patient Position: Sitting, Cuff Size: Large)   Pulse 93   Temp 98.9 F (37.2 C) (Oral)   Resp 16   Ht 6\' 1"  (1.854 m)   Wt (!) 325 lb (147.4 kg)   SpO2 100%   BMI 42.88 kg/m  Constitutional  As reported above Heart    Exam as reported above Lungs Back Abdomen   Results for orders placed or performed in visit on 05/05/19  POCT urinalysis dipstick  Result Value Ref Range   Color, UA Dark Yellow    Clarity, UA Cloudy    Glucose, UA Negative Negative   Bilirubin, UA Negative    Ketones, UA Negative    Spec Grav, UA 1.015 1.010 - 1.025   Blood, UA Positive    pH, UA 7.0 5.0 - 8.0   Protein, UA Negative Negative   Urobilinogen, UA 0.2 0.2 or 1.0 E.U./dL   Nitrite, UA Negative    Leukocytes, UA Large (3+) (A) Negative   Appearance     Odor      Assessment and Plan:  1. Hematuria, unspecified type  - POCT urinalysis dipstick - Urine Culture - ciprofloxacin  (CIPRO) 500 MG tablet; Take 1 tablet (500 mg total) by mouth 2 (two) times daily for 5 days.  Dispense: 10 tablet; Refill: 1  2. Hypogonadism in male  testosterone IM as previously ordered  3. Chronic low back pain, unspecified back pain laterality, unspecified whether sciatica present  - meloxicam (MOBIC) 15 MG tablet; Take 1 tablet (15 mg total) by mouth daily.  Dispense: 30 tablet; Refill: 0  4. Cystitis  - Urinalysis  5. Dysuria  - Urinalysis  6. Urgency of urination  - Urinalysis

## 2019-05-06 ENCOUNTER — Encounter: Payer: Self-pay | Admitting: General Practice

## 2019-05-06 DIAGNOSIS — N390 Urinary tract infection, site not specified: Secondary | ICD-10-CM | POA: Diagnosis not present

## 2019-05-06 DIAGNOSIS — R300 Dysuria: Secondary | ICD-10-CM | POA: Diagnosis not present

## 2019-05-06 DIAGNOSIS — N200 Calculus of kidney: Secondary | ICD-10-CM | POA: Diagnosis not present

## 2019-05-06 NOTE — Telephone Encounter (Signed)
error 

## 2019-05-08 LAB — URINE CULTURE

## 2019-05-18 ENCOUNTER — Telehealth: Payer: Self-pay

## 2019-05-18 NOTE — Telephone Encounter (Signed)
[facility] scheduled Elta Guadeloupe to see Dr. Bernardo Heater on 06/30/2019 at 8:30 am.  Elta Guadeloupe was notified by [facility] by phone call & mychart message from me.  AMD

## 2019-05-20 ENCOUNTER — Ambulatory Visit: Payer: 59

## 2019-05-20 ENCOUNTER — Other Ambulatory Visit: Payer: Self-pay

## 2019-05-20 DIAGNOSIS — E291 Testicular hypofunction: Secondary | ICD-10-CM

## 2019-05-25 ENCOUNTER — Ambulatory Visit: Payer: 59 | Admitting: Registered Nurse

## 2019-05-25 ENCOUNTER — Other Ambulatory Visit: Payer: Self-pay

## 2019-05-25 ENCOUNTER — Encounter: Payer: Self-pay | Admitting: Registered Nurse

## 2019-05-25 VITALS — BP 130/86 | HR 82 | Temp 98.8°F | Resp 12

## 2019-05-25 DIAGNOSIS — F988 Other specified behavioral and emotional disorders with onset usually occurring in childhood and adolescence: Secondary | ICD-10-CM

## 2019-05-25 DIAGNOSIS — M79672 Pain in left foot: Secondary | ICD-10-CM

## 2019-05-25 DIAGNOSIS — Z6841 Body Mass Index (BMI) 40.0 and over, adult: Secondary | ICD-10-CM

## 2019-05-25 DIAGNOSIS — B353 Tinea pedis: Secondary | ICD-10-CM

## 2019-05-25 MED ORDER — AMPHETAMINE-DEXTROAMPHET ER 20 MG PO CP24: 20.0000 mg | ORAL_CAPSULE | Freq: Every day | ORAL | 0 refills | Status: DC

## 2019-05-25 NOTE — Patient Instructions (Addendum)
Foot Care, Adult Foot care is an important part of your health. Noticing and addressing any potential problems early is the best way to prevent future foot problems. How to care for your [place] your feet daily with warm water and mild soap. Do not use hot water. Then, pat your feet and the areas between your toes until they are completely dry. Do not soak your feet as this can dry your skin. Trim your toenails straight across. Do not dig under them or around the cuticle. File the edges of your nails with an emery board or nail file. On the skin on your feet and on dry, brittle nails, apply a moisturizing lotion or petroleum jelly that is unscented and does not contain alcohol. Do not apply lotion between your toes. Shoes and Socks  Wear clean socks or stockings every day. Make sure they are not too tight. Wear shoes that fit properly and have enough cushioning. To break in new shoes, wear them for just a few hours a day. This prevents you from injuring your feet. Always look in your shoes before you put them on to be sure there are no objects inside. Wounds, Scrapes, Corns, and Calluses Check your feet daily for blisters, cuts, and redness. If you cannot see the bottom of your feet, use a mirror or ask someone for help. Do not cut corns or calluses. Do not try to remove them with medicine. If you find a minor scrape, cut, or break in the skin on your feet, keep it and the skin around it clean and dry. These areas may be cleaned with mild soap and water. Do not clean the area with peroxide, alcohol, or iodine. If you have a wound, scrape, corn, or callus on your foot, look at it several times a day to make sure it is healing and is not infected. Check for: More redness, swelling, or pain. More fluid or blood. Warmth. Pus or a bad smell. General Instructions Do not cross your legs. That may decrease the blood flow to your feet. Do not use heating pads or hot water bottles on your  feet. They may burn your skin. If you have lost feeling in your feet or legs, you may not know it is happening until it is too late. Make sure your health care provider does a complete foot exam at least annually or more often if you have foot problems. If you have foot problems, report any cuts, sores, or bruises to your health care provider immediately. Contact a health care provider if: You have a medical condition that increases your risk of infection and you have any cuts, sores, or bruises on your feet. You have an injury that is not healing. You notice redness on your legs or feet. You feel burning or tingling in your legs or feet. You have pain or cramps in your legs or feet. Your legs or feet are numb. Your feet always feel cold. You have pain around a toenail. Get help right away if: You have a wound, scrape, corn, or callus on your foot and: You have more redness, swelling, or pain. You have more fluid or blood. Your wound, scrape, corn, or callus feels warm to the touch. You have pus or a bad smell coming from the wound, scrape, corn, or callus. You have a fever. You have a red line going up your leg. This information is not intended to replace advice given to you by your health care provider.  Make sure you discuss any questions you have with your health care provider. Document Released: 12/07/2015 Document Revised: 07/17/2016 Document Reviewed: 12/07/2015 Elsevier Patient Education  2020 [place] With Attention Deficit Hyperactivity Disorder If you have been diagnosed with attention deficit hyperactivity disorder (ADHD), you may be relieved that you now know why you have felt or behaved a certain way. Still, you may feel overwhelmed about the treatment ahead. You may also wonder how to get the support you need and how to deal with the condition day-to-day. With treatment and support, you can live with ADHD and manage your symptoms. How to manage lifestyle changes  Managing stress Stress is your body's reaction to life changes and events, both good and bad. To cope with the stress of an ADHD diagnosis, it may help to:  Learn more about ADHD.  Exercise regularly. Even a short daily walk can lower stress levels.  Participate in training or education programs (including social skills training classes) that teach you to deal with symptoms.  Medicines Your health care provider may suggest certain medicines if he or she feels that they will help to improve your condition. Stimulant medicines are usually prescribed to treat ADHD, and therapy may also be prescribed. It is important to: Avoid using alcohol and other substances that may prevent your medicines from working properly [facility]).  Talk with your pharmacist or health care provider about all the medicines that you take, their possible side effects, and what medicines are safe to take together.  Make it your goal to take part in all treatment decisions (shared decision-making). Ask about possible side effects of medicines that your health care provider recommends, and tell him or her how you feel about having those side effects. It is best if shared decision-making with your health care provider is part of your total treatment plan. Relationships To strengthen your relationships with family members while treating your condition, consider taking part in family therapy. You might also attend self-help groups alone or with a loved one. Be honest about how your symptoms affect your relationships. Make an effort to communicate respectfully instead of fighting, and find ways to show others that you care. Psychotherapy may be useful in helping you cope with how ADHD affects your relationships. How to recognize changes in your condition The following signs may mean that your treatment is working well and your condition is improving:  Consistently being on time for appointments.  Being more organized at  home and work.  Other people noticing improvements in your behavior.  Achieving goals that you set for yourself.  Thinking more clearly. The following signs may mean that your treatment is not working very well:  Feeling impatience or more confusion.  Missing, forgetting, or being late for appointments.  An increasing sense of disorganization and messiness.  More difficulty in reaching goals that you set for yourself.  Loved ones becoming angry or frustrated with you. Where to find support Talking to others  Keep emotion out of important discussions and speak in a calm, logical way.  Listen closely and patiently to your loved ones. Try to understand their point of view, and try to avoid getting defensive.  Take responsibility for the consequences of your actions.  Ask that others do not take your behaviors personally.  Aim to solve problems as they come up, and express your feelings instead of bottling them up.  Talk openly about what you need from your loved ones and how they can support you.  Consider going to family therapy sessions or having your family meet with a specialist who deals with ADHD-related behavior problems. Finances Not all insurance plans cover mental health care, so it is important to check with your insurance carrier. If paying for co-pays or counseling services is a problem, search for a local or county mental health care center. Public mental health care services may be offered there at a low cost or no cost when you are not able to see a private health care provider. If you are taking medicine for ADHD, you may be able to get the generic form, which may be less expensive than brand-name medicine. Some makers of prescription medicines also offer help to patients who cannot afford the medicines that they need. Follow these instructions at home:  Take over-the-counter and prescription medicines only as told by your health care provider. Check with your  health care provider before taking any new medicines.  Create structure and an organized atmosphere at home. For example: ? Make a list of tasks, then rank them from most important to least important. Work on one task at a time until your listed tasks are done. ? Make a daily schedule and follow it consistently every day. ? Use an appointment calendar, and check it 2 or 3 times a day to keep on track. Keep it with you when you leave the house. ? Create spaces where you keep certain things, and always put things back in their places after you use them.  Keep all follow-up visits as told by your health care provider. This is important. Questions to ask your health care provider:  What are the risks and benefits of taking medicines?  Would I benefit from therapy?  How often should I follow up with a health care provider? Contact a health care provider if:  You have side effects from your medicines, such as: ? Repeated muscle twitches, coughing, or speech outbursts. ? Sleep problems. ? Loss of appetite. ? Depression. ? New or worsening behavior problems. ? Dizziness. ? Unusually fast heartbeat. ? Stomach pains. ? Headaches. Get help right away if:  You have a severe reaction to a medicine.  Your behavior suddenly gets worse. Summary  With treatment and support, you can live with ADHD and manage your symptoms.  The medicines that are most often prescribed for ADHD are stimulants.  Consider taking part in family therapy or self-help groups with family members or friends.  When you talk with friends and family about your ADHD, be patient and communicate openly.  Take over-the-counter and prescription medicines only as told by your health care provider. Check with your health care provider before taking any new medicines. This information is not intended to replace advice given to you by your health care provider. Make sure you discuss any questions you have with your health care  provider. Document Released: 10/30/2016 Document Revised: 10/22/2018 Document Reviewed: 10/30/2016 Elsevier Patient Education  [place] Risks of Being Overweight Maintaining a healthy body weight is an important part of your overall health. Your healthy body weight depends on your age, gender, and height. Being overweight puts you at risk for many health problems, including:  Heart disease.  Diabetes.  Problems sleeping.  Joint problems. You can make changes to your diet and lifestyle to prevent these risks. Consider working with a health care provider or a dietitian to make these changes. What nutrition changes can be made?   Eat only as much as your body  needs. In most cases, this is about 2,000 calories a day, but the amount varies depending on your height, gender, and activity level. Ask your health care provider how many calories you should have each day. Eating more than your body needs on a regular basis can cause you to become overweight or obese.  Eat slowly, and stop eating when you feel full.  Choose healthy foods, including: ? Fruits and vegetables. ? Lean meats. ? Low-fat dairy products. ? High-fiber foods, such as whole grains and beans. ? Healthy snacks like vegetable sticks, a piece of fruit, or a small amount of yogurt or cheese.  Avoid foods and drinks that are high in sugar, salt (sodium), saturated fat, or trans fat. This includes: ? Many desserts such as candy, cookies, and ice cream. ? Soda. ? Fried foods. ? Processed meats such as hot dogs or lunch meats. ? Prepackaged snack foods. What lifestyle changes can be made?   Exercise for at least 150 minutes a week to prevent weight gain, or as often as recommended by your health care provider. Do moderate-intensity exercise, such as brisk walking. ? Spread it out by exercising for 30 minutes 5 days a week, or in short 10-minute bursts several times a day.  Find other ways to stay  active and burn calories, such as yard work or a hobby that involves physical activity.  Get at least 8 hours of sleep each night. When you are well-rested, you are more likely to be active and make healthy choices during the day. To sleep better: ? Try to go to bed and wake up at about the same time every day. ? Keep your bedroom dark, quiet, and cool. ? Make sure that your bed is comfortable. ? Avoid stimulating activities, such as watching television or exercising, for at least one hour before bedtime. Why are these changes important? Eating healthy and being active helps you lose weight and prevent health problems caused by being overweight. Making these changes can also help you manage stress, feel better mentally, and connect with friends and family. What can happen if changes are not made? Being overweight can affect you for your entire life. You may develop joint or bone problems that make it painful or difficult for you to play sports or do activities you enjoy. Being overweight puts stress on your heart and lungs and can lead to medical problems like diabetes, heart disease, and sleeping problems. Where to find support You can get support for preventing health risks of being overweight from:  Your health care provider or a dietitian. They can provide guidance about healthy eating and healthy lifestyle choices.  Weight loss support groups, online or in-person. Where to find more information  MyPlate: FormerBoss.no ? This an online tool that provides personalized recommendations about foods to eat each day.  The Centers for Disease Control and Prevention: http://sharp-hammond.biz/ ? This resource gives tips for managing weight and having an active lifestyle. Summary  To prevent unhealthy weight gain, it is important to maintain a healthy diet high in vegetables and whole grains, exercise regularly, and get at least 8 hours of sleep each night.  Making these changes helps  prevent many long-term (chronic) health conditions that can shorten your life, such as diabetes, heart disease, and stroke. This information is not intended to replace advice given to you by your health care provider. Make sure you discuss any questions you have with your health care provider. Document Released: 05/27/2017 Document Revised: 07/03/2017 Document Reviewed:  05/27/2017 Elsevier Patient Education  [place] Pain Many things can cause foot pain. Some common causes are:  An injury.  A sprain.  Arthritis.  Blisters.  Bunions. Follow these instructions at home: Managing pain, stiffness, and swelling If directed, put ice on the painful area:  Put ice in a plastic bag.  Place a towel between your skin and the bag.  Leave the ice on for 20 minutes, 2-3 times a day.  Activity  Do not stand or walk for long periods.  Return to your normal activities as told by your health care provider. Ask your health care provider what activities are safe for you.  Do stretches to relieve foot pain and stiffness as told by your health care provider.  Do not lift anything that is heavier than 10 lb (4.5 kg), or the limit that you are told, until your health care provider says that it is safe. Lifting a lot of weight can put added pressure on your feet. Lifestyle  Wear comfortable, supportive shoes that fit you well. Do not wear high heels.  Keep your feet clean and dry. General instructions  Take over-the-counter and prescription medicines only as told by your health care provider.  Rub your foot gently.  Pay attention to any changes in your symptoms.  Keep all follow-up visits as told by your health care provider. This is important. Contact a health care provider if:  Your pain does not get better after a few days of self-care.  Your pain gets worse.  You cannot stand on your foot. Get help right away if:  Your foot is numb or tingling.  Your foot or toes  are swollen.  Your foot or toes turn white or blue.  You have warmth and redness along your foot. Summary  Common causes of foot pain are injury, sprain, arthritis, blisters or bunions.  Ice, medicines, and comfortable shoes may help foot pain.  Contact your health care provider if your pain does not get better after a few days of self-care. This information is not intended to replace advice given to you by your health care provider. Make sure you discuss any questions you have with your health care provider. Document Released: 07/27/2015 Document Revised: 04/15/2018 Document Reviewed: 04/15/2018 Elsevier Patient Education  [place]  Athlete's foot (tinea pedis) is a fungal infection of the skin on your feet. It often occurs on the skin that is between or underneath the toes. It can also occur on the soles of your feet. The infection can spread from person to person (is contagious). It can also spread when a person's bare feet come in contact with the fungus on shower floors or on items such as shoes. What are the causes? This condition is caused by a fungus that grows in warm, moist places. You can get athlete's foot by sharing shoes, shower stalls, towels, and wet floors with someone who is infected. Not washing your feet or changing your socks often enough can also lead to athlete's foot. What increases the risk? This condition is more likely to develop in:  Men.  People who have a weak body defense system (immune system).  People who have diabetes.  People who use public showers, such as at a gym.  People who wear heavy-duty shoes, such as Environmental manager.  Seasons with warm, humid weather. What are the signs or symptoms? Symptoms of this condition include:  Itchy areas between your toes or on  the soles of your feet.  White, flaky, or scaly areas between your toes or on the soles of your feet.  Very itchy small blisters between your  toes or on the soles of your feet.  Small cuts in your skin. These cuts can become infected.  Thick or discolored toenails. How is this diagnosed? This condition may be diagnosed with a physical exam and a review of your medical history. Your health care provider may also take a skin or toenail sample to examine under a microscope. How is this treated? This condition is treated with antifungal medicines. These may be applied as powders, ointments, or creams. In severe cases, an oral antifungal medicine may be given. Follow these instructions at home: Medicines  Apply or take over-the-counter and prescription medicines only as told by your health care provider.  Apply your antifungal medicine as told by your health care provider. Do not stop using the antifungal even if your condition improves. Foot care  Do not scratch your feet.  Keep your feet dry: ? Wear cotton or wool socks. Change your socks every day or if they become wet. ? Wear shoes that allow air to flow, such as sandals or canvas tennis shoes.  Wash and dry your feet, including the area between your toes. Also, wash and dry your feet: ? Every day or as told by your health care provider. ? After exercising. General instructions  Do not let others use towels, shoes, nail clippers, or other personal items that touch your feet.  Protect your feet by wearing sandals in wet areas, such as locker rooms and shared showers.  Keep all follow-up visits as told by your health care provider. This is important.  If you have diabetes, keep your blood sugar under control. Contact a health care provider if:  You have a fever.  You have swelling, soreness, warmth, or redness in your foot.  Your feet are not getting better with treatment.  Your symptoms get worse.  You have new symptoms. Summary  Athlete's foot (tinea pedis) is a fungal infection of the skin on your feet. It often occurs on skin that is between or underneath  the toes.  This condition is caused by a fungus that grows in warm, moist places.  Symptoms include white, flaky, or scaly areas between your toes or on the soles of your feet.  This condition is treated with antifungal medicines.  Keep your feet clean. Always dry them thoroughly. This information is not intended to replace advice given to you by your health care provider. Make sure you discuss any questions you have with your health care provider. Document Released: 06/27/2000 Document Revised: 06/25/2017 Document Reviewed: 04/20/2017 Elsevier Patient Education  2020 [employer].

## 2019-05-25 NOTE — Progress Notes (Signed)
Subjective:    Patient ID: Jeffrey Carlson, male    DOB: September 29, 1970, 48 y.o.   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  Here for adderall refill today ran out a week ago last fill per [place] PMP website 04/12/2019.  Stated some difficulty concentrating the past week--I am thinking about 3 different things at once and sometimes forget to finish a task.  Denied dyspnea, chest pain, insomnia, headaches, visual changes.  Patient with history left foot pain.  Wife working on getting callouses decreased.  Still feels knot under baby toe when walking.  Rash persists using athletes foot cream daily.  Alternating work boots has 2 pairs.  Today pain is flaring left 5th toe joint with walking.  Hasn't noticed any swelling/redness/discharge/fever/chills only callous in that area.  Also had kidney stone 05/06/2019 awaiting urology follow up scheduled for Dec 2020.  Was treated with cipro oral and rocephin IM, naproxen and hydrocodone/acetaminophen 5/325mg  po prn #20.  Patient reported symptoms resolved now but had been running fever and feeling awful.     Review of Systems  Constitutional: Negative for activity change, appetite change, chills, diaphoresis, fatigue and fever.  HENT: Negative for trouble swallowing and voice change.   Eyes: Negative for photophobia and visual disturbance.  Respiratory: Negative for cough, choking, chest tightness, shortness of breath, wheezing and stridor.   Cardiovascular: Negative for chest pain and palpitations.  Gastrointestinal: Negative for abdominal pain, diarrhea, nausea and vomiting.  Endocrine: Negative for cold intolerance and heat intolerance.  Genitourinary: Negative for difficulty urinating and hematuria.  Musculoskeletal: Positive for myalgias. Negative for arthralgias, back pain, gait problem, joint swelling, neck pain and neck stiffness.  Skin: Positive for color change and rash. Negative for pallor  and wound.  Allergic/Immunologic: Negative for food allergies.  Neurological: Negative for dizziness, tremors, seizures, syncope, facial asymmetry, speech difficulty, weakness, light-headedness, numbness and headaches.  Hematological: Negative for adenopathy. Does not bruise/bleed easily.  Psychiatric/Behavioral: Negative for agitation, confusion and sleep disturbance.       Objective:   Physical Exam Vitals signs and nursing note reviewed.  Constitutional:      General: He is not in acute distress.    Appearance: Normal appearance. He is well-developed and well-groomed. He is obese. He is not ill-appearing, toxic-appearing or diaphoretic.  HENT:     Head: Normocephalic and atraumatic.     Jaw: There is normal jaw occlusion.     Right Ear: Hearing and external ear normal.     Left Ear: Hearing and external ear normal.     Nose: Nose normal.     Mouth/Throat:     Mouth: Mucous membranes are moist.     Pharynx: Oropharynx is clear.  Eyes:     General: Lids are normal. Vision grossly intact. Gaze aligned appropriately. No allergic shiner, visual field deficit or scleral icterus.       Right eye: No discharge.        Left eye: No discharge.     Extraocular Movements: Extraocular movements intact.     Conjunctiva/sclera: Conjunctivae normal.     Pupils: Pupils are equal, round, and reactive to light.  Neck:     Musculoskeletal: Normal range of motion and neck supple. Normal range of motion. No edema, erythema, neck rigidity, crepitus, injury, pain with movement or torticollis.     Thyroid: No thyroid mass or thyromegaly.     Trachea: Trachea and phonation normal.  Cardiovascular:  Rate and Rhythm: Normal rate and regular rhythm.     Pulses: Normal pulses.          Radial pulses are 2+ on the right side and 2+ on the left side.       Dorsalis pedis pulses are 2+ on the left side.     Heart sounds: Normal heart sounds.  Pulmonary:     Effort: Pulmonary effort is normal. No  respiratory distress.     Breath sounds: Normal breath sounds and air entry. No stridor, decreased air movement or transmitted upper airway sounds. No decreased breath sounds, wheezing, rhonchi or rales.     Comments: Wearing cloth mask due to covid 19 pandemic; spoke full sentences without difficulty; no cough observed in exam room Abdominal:     Palpations: Abdomen is soft.  Musculoskeletal: Normal range of motion.        General: Tenderness present. No deformity or signs of injury.     Right shoulder: Normal.     Left shoulder: Normal.     Right elbow: Normal.    Left elbow: Normal.     Right hip: Normal.     Left hip: Normal.     Right knee: Normal.     Left knee: Normal.     Right ankle: Normal.     Left ankle: Normal.     Cervical back: Normal.     Right hand: Normal.     Left hand: Normal.     Right lower leg: No edema.     Left lower leg: No edema.     Right foot: Normal range of motion. No deformity or bunion.     Left foot: Normal range of motion. No deformity or bunion.       Feet:  Feet:     Right foot:     Skin integrity: Erythema, callus and dry skin present. No ulcer, blister, skin breakdown or fissure.     Left foot:     Skin integrity: Erythema, callus and dry skin present. No ulcer, blister, skin breakdown or fissure.     Comments: Large scale no erythema an abrasion noted patient reported wife giving him pedicures to remove callous Lymphadenopathy:     Head:     Right side of head: No submental, submandibular, tonsillar, preauricular, posterior auricular or occipital adenopathy.     Left side of head: No submental, submandibular, tonsillar, preauricular, posterior auricular or occipital adenopathy.     Cervical: No cervical adenopathy.     Right cervical: No superficial cervical adenopathy.    Left cervical: No superficial cervical adenopathy.  Skin:    General: Skin is warm and dry.     Capillary Refill: Capillary refill takes less than 2 seconds.      Coloration: Skin is not ashen, cyanotic, jaundiced, mottled, pale or sallow.     Findings: Abrasion, erythema and rash present. No abscess, acne, bruising, burn, ecchymosis, signs of injury, laceration, lesion, petechiae or wound. Rash is macular, papular and scaling. Rash is not crusting, nodular, purpuric, pustular, urticarial or vesicular.     Nails: There is no clubbing.           Comments: Bilateral moccasin foot papules linear lateral feet with macular erythema and large scale lateral/medially and plantar; dorsum not affected; callous noted under MTP joints especially thick 2 and 5th left  Neurological:     General: No focal deficit present.     Mental Status: He is alert and oriented to  person, place, and time. Mental status is at baseline.     GCS: GCS eye subscore is 4. GCS verbal subscore is 5. GCS motor subscore is 6.     Cranial Nerves: Cranial nerves are intact. No cranial nerve deficit, dysarthria or facial asymmetry.     Sensory: Sensation is intact. No sensory deficit.     Motor: Motor function is intact. No weakness, tremor, atrophy, abnormal muscle tone or seizure activity.     Coordination: Coordination is intact. Coordination normal.     Gait: Gait is intact. Gait normal.     Comments: On/off exam table and in/out of chair without difficulty; gait sure and steady in hallway no limp observed wearing leather calf high boots today  Psychiatric:        Attention and Perception: Attention and perception normal.        Mood and Affect: Mood and affect normal.        Speech: Speech normal.        Behavior: Behavior normal. Behavior is cooperative.        Thought Content: Thought content normal.        Cognition and Memory: Cognition and memory normal.        Judgment: Judgment normal.     CLINICAL DATA:  Left fourth MTP pain with weight-bearing and tenderness to palpation, hard palpable bump on plantar surface. History of stress fractures  EXAM: LEFT FOOT - COMPLETE 3+  VIEW  COMPARISON:  09/08/2017  FINDINGS: No fracture or dislocation of the left foot. There is unusual, very dense cortication of the second through fourth metatarsals, likely due to chronic mechanical stress and reported history of prior stress fractures. This appearance is unchanged when compared to prior examination dated 09/08/2017 without focal evidence of stress fracture or other abnormality. Small plantar and Achilles calcaneal spurs. The joint spaces are well preserved. There is no radiographic abnormality of the soft tissues, including findings to explain palpable bump on plantar surface.  IMPRESSION: 1. No fracture or dislocation of the left foot. There is unusual, very dense cortication of the second through fourth metatarsals, likely due to chronic mechanical stress and reported history of prior stress fractures. This appearance is unchanged when compared to prior examination dated 09/08/2017 without focal evidence of stress fracture or other abnormality.  2. There is no radiographic abnormality of the soft tissues, including findings to explain palpable bump on plantar surface.  3. MRI may be helpful to evaluate for both metatarsal marrow edema if stress fracture is suspected as well as palpable abnormality.   Electronically Signed   By: Eddie Candle M.D.   On: 03/12/2019 14:48       Assessment & Plan:  A-ADD, obesity BMI 42.89, acute left foot pain, bilateral tinea pedis, history nephrolithiasis  P-Refilled adderall 20mg  XR po daily #30 RF0 electronically to his pharmacy of choice.  Patient notified next face to face visit 3 months Feb 2021 to continue to receive his refills.  Discussed weekend/day off holidays from medication.  Follow up if headaches, chest pain, dyspnea, insomnia, vision changes for re-evaluation.  Exitcare handout living with ADHD.  Reviewed  PMP website all Adderall Rx from [facility] providers in the previous 2 years.   Patient verbalized understanding information/instructions, agreed with plan of care and had no further questions at this time.  Encouraged exercise low impact/weight loss/healthy diet choices.  Left foot pain continues not improved with callous removal.  He gets a little decrease in pain  but it does not completely resolve.  Discussed alternate footwear do not wear same pair of boots every day.  Recommended podiatry evaluation and patient stated he was planning to schedule appt especially after foot flaring today.  Has pain medication at home mobic po daily for prn use.  Discussed epsom salt soak prior to pumice stone use and discuss callous removal with podiatrist.  Previous xray showed dense cortication 2-4 metatarsals unchaged from previous 09/08/2017.  Discussed podiatry may order further imaging. Discussed applying emollient to feet daily after shower.  Exitcare handout on foot pain. Patient verbalized understanding information/instructions, agreed with plan of care and had no further questions at this time.  Discussed rotating shoes, starting antifungal foot powder in addition to foot cream, putting shoes in sun after wearing to dry out.  Discussed lamisil po again and patient did not want to start at this time.  Changing socks mid day if soaked during hot months.  Medication as directed OTC antifungal foot powder or spray daily. Call or return to clinic as needed if these symptoms worsen or fail to improve as anticipated. Exitcare handout on tinea pedis.  Reoccurrence of condition common and may require retreatment. Patient  verbalized agreement and understanding of treatment plan and had no further questions at this time. P2: Avoidance and hand washing.   Keep his scheduled appt with urology.  Avoid dehydration and know kidney stone triggers in diet.  Patient verbalized understanding information/instructions agreed with plan of care and had no further questions at this time.

## 2019-05-26 DIAGNOSIS — M9905 Segmental and somatic dysfunction of pelvic region: Secondary | ICD-10-CM | POA: Diagnosis not present

## 2019-05-26 DIAGNOSIS — M5432 Sciatica, left side: Secondary | ICD-10-CM | POA: Diagnosis not present

## 2019-05-26 DIAGNOSIS — M9903 Segmental and somatic dysfunction of lumbar region: Secondary | ICD-10-CM | POA: Diagnosis not present

## 2019-05-26 DIAGNOSIS — M955 Acquired deformity of pelvis: Secondary | ICD-10-CM | POA: Diagnosis not present

## 2019-06-01 DIAGNOSIS — M5432 Sciatica, left side: Secondary | ICD-10-CM | POA: Diagnosis not present

## 2019-06-01 DIAGNOSIS — M955 Acquired deformity of pelvis: Secondary | ICD-10-CM | POA: Diagnosis not present

## 2019-06-01 DIAGNOSIS — M9905 Segmental and somatic dysfunction of pelvic region: Secondary | ICD-10-CM | POA: Diagnosis not present

## 2019-06-01 DIAGNOSIS — M9903 Segmental and somatic dysfunction of lumbar region: Secondary | ICD-10-CM | POA: Diagnosis not present

## 2019-06-03 DIAGNOSIS — M9903 Segmental and somatic dysfunction of lumbar region: Secondary | ICD-10-CM | POA: Diagnosis not present

## 2019-06-03 DIAGNOSIS — M955 Acquired deformity of pelvis: Secondary | ICD-10-CM | POA: Diagnosis not present

## 2019-06-03 DIAGNOSIS — M5432 Sciatica, left side: Secondary | ICD-10-CM | POA: Diagnosis not present

## 2019-06-03 DIAGNOSIS — M9905 Segmental and somatic dysfunction of pelvic region: Secondary | ICD-10-CM | POA: Diagnosis not present

## 2019-06-06 DIAGNOSIS — M5432 Sciatica, left side: Secondary | ICD-10-CM | POA: Diagnosis not present

## 2019-06-06 DIAGNOSIS — M9905 Segmental and somatic dysfunction of pelvic region: Secondary | ICD-10-CM | POA: Diagnosis not present

## 2019-06-06 DIAGNOSIS — M9903 Segmental and somatic dysfunction of lumbar region: Secondary | ICD-10-CM | POA: Diagnosis not present

## 2019-06-06 DIAGNOSIS — M955 Acquired deformity of pelvis: Secondary | ICD-10-CM | POA: Diagnosis not present

## 2019-06-08 ENCOUNTER — Other Ambulatory Visit: Payer: Self-pay

## 2019-06-08 ENCOUNTER — Ambulatory Visit: Payer: 59

## 2019-06-08 DIAGNOSIS — M955 Acquired deformity of pelvis: Secondary | ICD-10-CM | POA: Diagnosis not present

## 2019-06-08 DIAGNOSIS — E291 Testicular hypofunction: Secondary | ICD-10-CM

## 2019-06-08 DIAGNOSIS — M9905 Segmental and somatic dysfunction of pelvic region: Secondary | ICD-10-CM | POA: Diagnosis not present

## 2019-06-08 DIAGNOSIS — M5432 Sciatica, left side: Secondary | ICD-10-CM | POA: Diagnosis not present

## 2019-06-08 DIAGNOSIS — M9903 Segmental and somatic dysfunction of lumbar region: Secondary | ICD-10-CM | POA: Diagnosis not present

## 2019-06-08 NOTE — Progress Notes (Signed)
Rx # [id number] for Testosterone Cypionate 200 mg /ml - 0.5 ml (100 mg) IM q 14 days has 3 refills to be used by 03/217/2021. Called into  [facility]) for refill for patient.  Only has 1 dose left at clinic.  AMD

## 2019-06-13 DIAGNOSIS — M9903 Segmental and somatic dysfunction of lumbar region: Secondary | ICD-10-CM | POA: Diagnosis not present

## 2019-06-13 DIAGNOSIS — M955 Acquired deformity of pelvis: Secondary | ICD-10-CM | POA: Diagnosis not present

## 2019-06-13 DIAGNOSIS — M9905 Segmental and somatic dysfunction of pelvic region: Secondary | ICD-10-CM | POA: Diagnosis not present

## 2019-06-13 DIAGNOSIS — M5432 Sciatica, left side: Secondary | ICD-10-CM | POA: Diagnosis not present

## 2019-06-15 ENCOUNTER — Other Ambulatory Visit: Payer: Self-pay

## 2019-06-15 ENCOUNTER — Other Ambulatory Visit: Payer: 59

## 2019-06-15 DIAGNOSIS — E291 Testicular hypofunction: Secondary | ICD-10-CM

## 2019-06-15 NOTE — Progress Notes (Signed)
Presents for labs for follow-up of testosterone.  1 week in between injections. Scheduled to follow-up results with  Randel Pigg, PA-C (Interim Provider) on  07/06/2019.  AMD

## 2019-06-16 LAB — CBC WITH DIFFERENTIAL/PLATELET
Basophils Absolute: 0.1 10*3/uL (ref 0.0–0.2)
Basos: 1 %
EOS (ABSOLUTE): 0.2 10*3/uL (ref 0.0–0.4)
Eos: 2 %
Hematocrit: 51.4 % — ABNORMAL HIGH (ref 37.5–51.0)
Hemoglobin: 16.7 g/dL (ref 13.0–17.7)
Immature Grans (Abs): 0.1 10*3/uL (ref 0.0–0.1)
Immature Granulocytes: 1 %
Lymphocytes Absolute: 1.8 10*3/uL (ref 0.7–3.1)
Lymphs: 19 %
MCH: 27.6 pg (ref 26.6–33.0)
MCHC: 32.5 g/dL (ref 31.5–35.7)
MCV: 85 fL (ref 79–97)
Monocytes Absolute: 0.5 10*3/uL (ref 0.1–0.9)
Monocytes: 5 %
Neutrophils Absolute: 6.7 10*3/uL (ref 1.4–7.0)
Neutrophils: 72 %
Platelets: 309 10*3/uL (ref 150–450)
RBC: 6.05 x10E6/uL — ABNORMAL HIGH (ref 4.14–5.80)
RDW: 13.7 % (ref 11.6–15.4)
WBC: 9.2 10*3/uL (ref 3.4–10.8)

## 2019-06-16 LAB — SPECIMEN STATUS REPORT

## 2019-06-17 ENCOUNTER — Telehealth: Payer: Self-pay

## 2019-06-17 DIAGNOSIS — M5432 Sciatica, left side: Secondary | ICD-10-CM | POA: Diagnosis not present

## 2019-06-17 DIAGNOSIS — M955 Acquired deformity of pelvis: Secondary | ICD-10-CM | POA: Diagnosis not present

## 2019-06-17 DIAGNOSIS — M9903 Segmental and somatic dysfunction of lumbar region: Secondary | ICD-10-CM | POA: Diagnosis not present

## 2019-06-17 DIAGNOSIS — M9905 Segmental and somatic dysfunction of pelvic region: Secondary | ICD-10-CM | POA: Diagnosis not present

## 2019-06-17 LAB — TESTOSTERONE,FREE AND TOTAL
Testosterone, Free: 11.8 pg/mL (ref 6.8–21.5)
Testosterone: 513 ng/dL (ref 264–916)

## 2019-06-17 NOTE — Telephone Encounter (Signed)
[facility] notified us that Jeffrey Carlson is scheduled to see Dr. Bernardo Heater 06/30/2019 at 8:30 am.  AMD

## 2019-06-20 DIAGNOSIS — M9903 Segmental and somatic dysfunction of lumbar region: Secondary | ICD-10-CM | POA: Diagnosis not present

## 2019-06-20 DIAGNOSIS — M9905 Segmental and somatic dysfunction of pelvic region: Secondary | ICD-10-CM | POA: Diagnosis not present

## 2019-06-20 DIAGNOSIS — M5432 Sciatica, left side: Secondary | ICD-10-CM | POA: Diagnosis not present

## 2019-06-20 DIAGNOSIS — M955 Acquired deformity of pelvis: Secondary | ICD-10-CM | POA: Diagnosis not present

## 2019-06-24 ENCOUNTER — Telehealth: Payer: 59 | Admitting: Emergency Medicine

## 2019-06-24 DIAGNOSIS — M5442 Lumbago with sciatica, left side: Secondary | ICD-10-CM

## 2019-06-24 DIAGNOSIS — M5441 Lumbago with sciatica, right side: Secondary | ICD-10-CM

## 2019-06-24 MED ORDER — PREDNISONE 20 MG PO TABS: 40.0000 mg | ORAL_TABLET | Freq: Every day | ORAL | 0 refills | Status: DC

## 2019-06-24 MED ORDER — CYCLOBENZAPRINE HCL 10 MG PO TABS: 10.0000 mg | ORAL_TABLET | Freq: Three times a day (TID) | ORAL | 0 refills | Status: DC | PRN

## 2019-06-24 NOTE — Progress Notes (Signed)

## 2019-06-28 ENCOUNTER — Other Ambulatory Visit: Payer: Self-pay

## 2019-06-28 ENCOUNTER — Ambulatory Visit (INDEPENDENT_AMBULATORY_CARE_PROVIDER_SITE_OTHER): Payer: Managed Care, Other (non HMO) | Admitting: Podiatry

## 2019-06-28 ENCOUNTER — Encounter: Payer: Self-pay | Admitting: Podiatry

## 2019-06-28 DIAGNOSIS — B353 Tinea pedis: Secondary | ICD-10-CM

## 2019-06-28 DIAGNOSIS — L989 Disorder of the skin and subcutaneous tissue, unspecified: Secondary | ICD-10-CM | POA: Diagnosis not present

## 2019-06-28 MED ORDER — TERBINAFINE HCL 250 MG PO TABS: 250.0000 mg | ORAL_TABLET | Freq: Every day | ORAL | 0 refills | Status: DC

## 2019-06-28 MED ORDER — CLOTRIMAZOLE-BETAMETHASONE 1-0.05 % EX CREA: 1.0000 "application " | TOPICAL_CREAM | Freq: Two times a day (BID) | CUTANEOUS | 1 refills | Status: DC

## 2019-06-29 ENCOUNTER — Other Ambulatory Visit: Payer: Self-pay | Admitting: Registered Nurse

## 2019-06-29 ENCOUNTER — Ambulatory Visit: Payer: 59

## 2019-06-29 ENCOUNTER — Other Ambulatory Visit: Payer: Self-pay | Admitting: Physician Assistant

## 2019-06-29 DIAGNOSIS — E291 Testicular hypofunction: Secondary | ICD-10-CM

## 2019-06-29 DIAGNOSIS — F988 Other specified behavioral and emotional disorders with onset usually occurring in childhood and adolescence: Secondary | ICD-10-CM

## 2019-06-29 MED ORDER — AMPHETAMINE-DEXTROAMPHET ER 20 MG PO CP24: 20.0000 mg | ORAL_CAPSULE | Freq: Every day | ORAL | 0 refills | Status: DC

## 2019-06-29 NOTE — Progress Notes (Signed)
Rx long-standing Seen in office every third month or more frequently if concern. Last seen 05/25/19 and doing well. Rx to be refilled at this time  [facility] PA-C

## 2019-06-30 ENCOUNTER — Ambulatory Visit (INDEPENDENT_AMBULATORY_CARE_PROVIDER_SITE_OTHER): Payer: Managed Care, Other (non HMO) | Admitting: Urology

## 2019-06-30 ENCOUNTER — Encounter: Payer: Self-pay | Admitting: Urology

## 2019-06-30 ENCOUNTER — Other Ambulatory Visit: Payer: Self-pay

## 2019-06-30 VITALS — BP 142/88 | HR 92 | Ht 73.0 in | Wt 305.0 lb

## 2019-06-30 DIAGNOSIS — N410 Acute prostatitis: Secondary | ICD-10-CM | POA: Diagnosis not present

## 2019-06-30 DIAGNOSIS — N390 Urinary tract infection, site not specified: Secondary | ICD-10-CM | POA: Diagnosis not present

## 2019-06-30 LAB — URINALYSIS, COMPLETE
Bilirubin, UA: NEGATIVE
Glucose, UA: NEGATIVE
Ketones, UA: NEGATIVE
Leukocytes,UA: NEGATIVE
Nitrite, UA: NEGATIVE
Protein,UA: NEGATIVE
RBC, UA: NEGATIVE
Specific Gravity, UA: 1.025 (ref 1.005–1.030)
Urobilinogen, Ur: 1 mg/dL (ref 0.2–1.0)
pH, UA: 6.5 (ref 5.0–7.5)

## 2019-06-30 LAB — MICROSCOPIC EXAMINATION
Bacteria, UA: NONE SEEN
RBC, Urine: NONE SEEN /hpf (ref 0–2)

## 2019-06-30 NOTE — Progress Notes (Signed)
06/30/2019 9:03 AM   Jeffrey Carlson Dec 09, 1970 [id number]  Referring provider: Jan Fireman, PA-C [place],   [id number]  Chief Complaint  Patient presents with  . Prostatitis    HPI: Jeffrey Carlson is a 48 y.o. male presents for follow-up of recent ED visit.  He was seen by the city [facility] [place] health care on 05/05/2019 complaining of urinary frequency, urgency and dysuria.  Urinalysis showed large leukocytes and urine culture grew >100,000 E. coli.  He was started on Cipro however that evening developed chills, malaise, headache and fever to 102 degrees.  He went to [facility] urgent care the following morning and was given an injection of Rocephin.  His symptoms resolved and he remains asymptomatic.  He has a prior history of prostatitis and kidney stones.  His last imaging was a CT in 2015 which was unremarkable.  He is on TRT which is followed by the city medical staff.   PMH: Past Medical History:  Diagnosis Date  . Adult ADHD   . Arthritis   . Barrett esophagus   . Colon polyps   . Degenerative joint disease   . GERD (gastroesophageal reflux disease)   . Hiatal hernia   . Hypertension    dr Burt Ek   [place]  . Leukocytosis   . Obesity   . Polycythemia, secondary 12/20/2014  . Sleep apnea    cpap  . Testosterone deficiency   . Undescended right testicle     Surgical History: Past Surgical History:  Procedure Laterality Date  . APPENDECTOMY    . BACK SURGERY    . CHOLECYSTECTOMY    . COLONOSCOPY  06/2010   + small bowel capsule study  . EYE SURGERY     childhood  . KNEE SURGERY    . ROTATOR CUFF REPAIR     rt shoulder    Home Medications:  Allergies as of 06/30/2019   No Known Allergies     Medication List       Accurate as of June 30, 2019  9:03 AM. If you have any questions, ask your nurse or doctor.        STOP taking these medications   ferrous sulfate 325 (65 FE) MG tablet Stopped by: Abbie Sons,  MD     TAKE these medications   amphetamine-dextroamphetamine 20 MG 24 hr capsule Commonly known as: Adderall XR Take 1 capsule (20 mg total) by mouth daily.   clotrimazole-betamethasone cream Commonly known as: Lotrisone Apply 1 application topically 2 (two) times daily.   cyclobenzaprine 10 MG tablet Commonly known as: FLEXERIL Take 1 tablet (10 mg total) by mouth 3 (three) times daily as needed for muscle spasms.   esomeprazole 40 MG capsule Commonly known as: NEXIUM Take 40 mg by mouth daily at 12 noon.   lisinopril 20 MG tablet Commonly known as: ZESTRIL Take 1 tablet (20 mg total) by mouth daily.   predniSONE 20 MG tablet Commonly known as: DELTASONE Take 2 tablets (40 mg total) by mouth daily. Take 40 mg by mouth daily for 3 days, then 20mg  by mouth daily for 3 days, then 10mg  daily for 3 days   terbinafine 250 MG tablet Commonly known as: LamISIL Take 1 tablet (250 mg total) by mouth daily.   testosterone cypionate 200 MG/ML injection Commonly known as: DEPOTESTOSTERONE CYPIONATE   Testosterone Cypionate 200 MG/ML Soln Inject 100 mg as directed every 14 (fourteen) days.       Allergies: No  Known Allergies  Family History: Family History  Problem Relation Age of Onset  . Thyroid cancer Father   . Rheum arthritis Mother   . Abnormal EKG Mother   . Colon cancer Paternal Grandmother     Social History:  reports that he has never smoked. He has quit using smokeless tobacco.  His smokeless tobacco use included chew. He reports current alcohol use. He reports that he does not use drugs.  ROS: UROLOGY Frequent Urination?: No Hard to postpone urination?: Yes Burning/pain with urination?: No Get up at night to urinate?: No Leakage of urine?: No Urine stream starts and stops?: No Trouble starting stream?: No Do you have to strain to urinate?: No Blood in urine?: No Urinary tract infection?: No Sexually transmitted disease?: No Injury to kidneys or  bladder?: No Painful intercourse?: No Weak stream?: No Erection problems?: No Penile pain?: No  Gastrointestinal Nausea?: No Vomiting?: No Indigestion/heartburn?: No Diarrhea?: No Constipation?: No  Constitutional Fever: No Night sweats?: No Weight loss?: No Fatigue?: No  Skin Skin rash/lesions?: No Itching?: No  Eyes Blurred vision?: No Double vision?: No  Ears/Nose/Throat Sore throat?: No Sinus problems?: No  Hematologic/Lymphatic Swollen glands?: No Easy bruising?: No  Cardiovascular Leg swelling?: No Chest pain?: No  Respiratory Cough?: No Shortness of breath?: No  Endocrine Excessive thirst?: No  Musculoskeletal Back pain?: No Joint pain?: No  Neurological Headaches?: No Dizziness?: No  Psychologic Depression?: No Anxiety?: No  Physical Exam: BP (!) 142/88 (BP Location: Left Arm, Patient Position: Sitting, Cuff Size: Large)   Pulse 92   Ht 6\' 1"  (1.854 m)   Wt (!) 305 lb (138.3 kg)   BMI 40.24 kg/m   Constitutional:  Alert and oriented, No acute distress. HEENT: [place] AT, moist mucus membranes.  Trachea midline, no masses. Cardiovascular: No clubbing, cyanosis, or edema. Respiratory: Normal respiratory effort, no increased work of breathing. GI: Abdomen is soft, nontender, nondistended, no abdominal masses GU: Prostate 30 g, smooth without nodules Skin: No rashes, bruises or suspicious lesions. Neurologic: Grossly intact, no focal deficits, moving all 4 extremities. Psychiatric: Normal mood and affect.  Assessment & Plan:    Acute prostatitis-resolved Urinalysis today was unremarkable.  I discussed an extended antibiotic course to cover the prostate however he completed antibiotics a little less than 2 months ago, is asymptomatic and urinalysis today is clear.  We will hold off on additional antibiotic therapy however he was instructed to call should he develop recurrent symptoms.  Recommend scheduling a renal ultrasound to evaluate  upper tracts and survey bladder views.  He would like to follow-up at least annually.   Abbie Sons, [place] [address], [place], [place] [address] [phone]

## 2019-07-01 NOTE — Progress Notes (Signed)
   Subjective: 48 y.o. male presenting to the office today with a chief complaint of a painful lesion noted to the ball of the left foot that appeared a few months ago. He also reports some possible fungus to the bilateral feet. He has not done anything for treatment. Walking increases the pain of the callus. Patient is here for further evaluation and treatment.   Past Medical History:  Diagnosis Date  . Adult ADHD   . Arthritis   . Barrett esophagus   . Colon polyps   . Degenerative joint disease   . GERD (gastroesophageal reflux disease)   . Hiatal hernia   . Hypertension    dr Burt Ek     . Leukocytosis   . Obesity   . Polycythemia, secondary 12/20/2014  . Sleep apnea    cpap  . Testosterone deficiency   . Undescended right testicle      Objective:  Physical Exam General: Alert and oriented x3 in no acute distress  Dermatology: Hyperkeratotic lesion(s) present on the sub-fourth MPJ of the left foot. Pain on palpation with a central nucleated core noted. Pruritus noted to the bilateral feet with hyperkeratosis. Skin is warm, dry and supple bilateral lower extremities. Negative for open lesions or macerations.  Vascular: Palpable pedal pulses bilaterally. No edema or erythema noted. Capillary refill within normal limits.  Neurological: Epicritic and protective threshold grossly intact bilaterally.   Musculoskeletal Exam: Pain on palpation at the keratotic lesion(s) noted. Range of motion within normal limits bilateral. Muscle strength 5/5 in all groups bilateral.  Assessment: 1. Porokeratosis sub-fourth MPJ left 2. Tinea Pedis bilateral    Plan of Care:  1. Patient evaluated 2. Excisional debridement of keratoic lesion(s) using a chisel blade was performed without incident.  3. Dressed area with light dressing. 4. Recommended OTC corn and callus remover.  5. Met pads dispensed.  6. Prescription for Lamisil 250 mg #28 provided to patient.  7. Prescription  for Lotrisone cream provided to patient.  8. Patient is to return to the clinic PRN.   Edrick Kins, DPM [facility]  Dr. Edrick Kins, [address]                                        [place], [place] [address]                Office [phone]  Fax [phone]

## 2019-07-05 ENCOUNTER — Other Ambulatory Visit: Payer: Self-pay

## 2019-07-05 ENCOUNTER — Telehealth: Payer: Self-pay | Admitting: Urology

## 2019-07-05 VITALS — Temp 98.3°F

## 2019-07-05 DIAGNOSIS — R319 Hematuria, unspecified: Secondary | ICD-10-CM

## 2019-07-05 LAB — POCT URINALYSIS DIPSTICK
Bilirubin, UA: NEGATIVE
Blood, UA: POSITIVE
Glucose, UA: NEGATIVE
Ketones, UA: NEGATIVE
Nitrite, UA: POSITIVE
Protein, UA: NEGATIVE
Spec Grav, UA: >=1.03 — AB (ref 1.010–1.025)
Urobilinogen, UA: 0.2 E.U./dL
pH, UA: 6 (ref 5.0–8.0)

## 2019-07-05 MED ORDER — SULFAMETHOXAZOLE-TRIMETHOPRIM 800-160 MG PO TABS: 1.0000 | ORAL_TABLET | Freq: Two times a day (BID) | ORAL | 0 refills | Status: AC

## 2019-07-05 NOTE — Telephone Encounter (Signed)
Patient notified

## 2019-07-05 NOTE — Progress Notes (Signed)
Presents with c/O increased urinary frequency & burning with urination that started last night.  States he was up about every hour to hour & half last night.  Appt last week 06/30/2019 with  Dr. Bernardo Heater (Urologist) & they did a urinalysis that was clear.  Appt scheduled in clinic tomorrow 07/06/2019 with Randel Pigg, PA-C (Interim Provider).  AMD

## 2019-07-05 NOTE — Telephone Encounter (Signed)
Pt called office to let you know that he is having burning and frequency with urination.  His test results from [facility] should be in his MyChart.  It showed infection and pt would like to proceed with antibiotics.

## 2019-07-05 NOTE — Telephone Encounter (Signed)
He had an episode of acute prostatitis back in late October.  Was going to treat him with additional antibiotic at the office visit last week however his urinalysis was normal and he had been symptom-free for 2 months.  I will go ahead and send in a 28-day course of antibiotic to adequately cover the prostate.

## 2019-07-06 ENCOUNTER — Encounter: Payer: Self-pay | Admitting: Physician Assistant

## 2019-07-06 ENCOUNTER — Ambulatory Visit: Payer: 59 | Admitting: Physician Assistant

## 2019-07-06 VITALS — BP 140/90 | HR 96 | Temp 99.6°F | Resp 16

## 2019-07-06 DIAGNOSIS — R109 Unspecified abdominal pain: Secondary | ICD-10-CM

## 2019-07-06 DIAGNOSIS — N3090 Cystitis, unspecified without hematuria: Secondary | ICD-10-CM

## 2019-07-06 DIAGNOSIS — E291 Testicular hypofunction: Secondary | ICD-10-CM

## 2019-07-06 MED ORDER — OXYCODONE-ACETAMINOPHEN 7.5-325 MG PO TABS: 1.0000 | ORAL_TABLET | Freq: Four times a day (QID) | ORAL | 0 refills | Status: DC | PRN

## 2019-07-06 NOTE — Progress Notes (Signed)
Has Lamisil Rx but hasnt started yet - wants to get UTI cleared 1st.  Saw Dr. Cecelia Byars for calloused area on left foot.  Requesting Rx refill for Meloxicam 15 mg - takes for chronic knee pain.

## 2019-07-06 NOTE — Progress Notes (Signed)
   Subjective:Hypogonadism    Patient ID: Jeffrey Carlson, male    DOB: [birth year], 48 y.o.   MRN: [id number]  HPI Patient presents for follow-up after urology visit.  Patient is complaining of right flank pain, dysuria, hematuria, and urinary frequency.  Patient dipstick urinalysis was positive for nitrates, RBC, and leukocytes.  Urine culture is pending.   Review of Systems Negative set for complaint.    Objective:   Physical Exam No acute distress.  Morbid obesity.  Body habitus does limit the lumbar spine exam.  Patient does have right flank pain.  Dip UA as above.  Rates of Gesterol levels often normal range.       Assessment & Plan:  Differential consist of urinary tract infection and renal calculus.  Patient started on Bactrim DS and given a prescription for Percocets.  Patient advised to follow-up at the emergency room if flank pain increase or there is a decrease in voiding.

## 2019-07-08 LAB — URINE CULTURE

## 2019-07-22 DIAGNOSIS — M1711 Unilateral primary osteoarthritis, right knee: Secondary | ICD-10-CM | POA: Diagnosis not present

## 2019-07-22 DIAGNOSIS — M25561 Pain in right knee: Secondary | ICD-10-CM | POA: Diagnosis not present

## 2019-07-29 ENCOUNTER — Telehealth: Payer: Self-pay | Admitting: Physician Assistant

## 2019-07-29 DIAGNOSIS — N3090 Cystitis, unspecified without hematuria: Secondary | ICD-10-CM

## 2019-07-29 DIAGNOSIS — G8929 Other chronic pain: Secondary | ICD-10-CM

## 2019-07-29 DIAGNOSIS — F988 Other specified behavioral and emotional disorders with onset usually occurring in childhood and adolescence: Secondary | ICD-10-CM

## 2019-07-29 NOTE — Telephone Encounter (Signed)
Patient seen 12/23 and noted to have evidence of bladder infection- culture pending  Culture E Coli > 100.00  Appears to be on long standing Rx ATB  From Urology  [facility] call and confirm- Request that patient return for U/A and repeat culture 3-5 days after end of atb Rx  Check on status and comfort level please  Thanks    [facility]

## 2019-07-29 NOTE — Telephone Encounter (Signed)
Patient states he is taking the antibiotics, he is unsure how many days he has left but symptoms have improved and resolved. Patient will call once antibiotics completed to schedule lab appointment.I have ordered culture and dip for future.  Patient also would like refill for Meloxicam 15 mg qd [facility] [place] 

## 2019-08-02 MED ORDER — MELOXICAM 15 MG PO TABS: 15.0000 mg | ORAL_TABLET | Freq: Every day | ORAL | 0 refills | Status: DC

## 2019-08-02 NOTE — Telephone Encounter (Signed)
Ok to renew Meloxicam for a month...review at follow up  Need weight loss and exercise with good supportive shoes POst atb urine pending

## 2019-08-04 ENCOUNTER — Other Ambulatory Visit: Payer: Self-pay

## 2019-08-04 DIAGNOSIS — F988 Other specified behavioral and emotional disorders with onset usually occurring in childhood and adolescence: Secondary | ICD-10-CM

## 2019-08-04 MED ORDER — AMPHETAMINE-DEXTROAMPHET ER 20 MG PO CP24: 20.0000 mg | ORAL_CAPSULE | Freq: Every day | ORAL | 0 refills | Status: DC

## 2019-08-09 ENCOUNTER — Other Ambulatory Visit: Payer: Self-pay

## 2019-08-09 ENCOUNTER — Ambulatory Visit: Payer: 59

## 2019-08-09 DIAGNOSIS — N3090 Cystitis, unspecified without hematuria: Secondary | ICD-10-CM

## 2019-08-09 DIAGNOSIS — F988 Other specified behavioral and emotional disorders with onset usually occurring in childhood and adolescence: Secondary | ICD-10-CM

## 2019-08-09 LAB — POCT URINALYSIS DIPSTICK
Bilirubin, UA: NEGATIVE
Blood, UA: NEGATIVE
Glucose, UA: NEGATIVE
Ketones, UA: NEGATIVE
Leukocytes, UA: NEGATIVE
Nitrite, UA: NEGATIVE
Protein, UA: NEGATIVE
Spec Grav, UA: 1.02 (ref 1.010–1.025)
Urobilinogen, UA: 0.2 E.U./dL
pH, UA: 6.5 (ref 5.0–8.0)

## 2019-08-09 MED ORDER — AMPHETAMINE-DEXTROAMPHET ER 20 MG PO CP24: 20.0000 mg | ORAL_CAPSULE | Freq: Every day | ORAL | 0 refills | Status: DC

## 2019-08-11 LAB — URINE CULTURE: Organism ID, Bacteria: NO GROWTH

## 2019-08-24 ENCOUNTER — Other Ambulatory Visit: Payer: Self-pay

## 2019-08-24 DIAGNOSIS — M545 Low back pain, unspecified: Secondary | ICD-10-CM

## 2019-08-24 DIAGNOSIS — G8929 Other chronic pain: Secondary | ICD-10-CM

## 2019-08-24 MED ORDER — MELOXICAM 15 MG PO TABS: 15.0000 mg | ORAL_TABLET | Freq: Every day | ORAL | 2 refills | Status: AC

## 2019-08-24 NOTE — Telephone Encounter (Signed)
Use started 2009  Labs 09/20/2018 per Epic  Last physical 05 Oct 2018 with Dr Terrace Arabia for chronic back pain; renal and liver function stable.  Last office visit 07/06/2019 with PA Delila Spence refill patient to schedule annual physical/office visit due Mar 2021.

## 2019-09-02 ENCOUNTER — Other Ambulatory Visit: Payer: Self-pay

## 2019-09-02 ENCOUNTER — Ambulatory Visit: Payer: 59

## 2019-09-02 DIAGNOSIS — E291 Testicular hypofunction: Secondary | ICD-10-CM

## 2019-09-02 NOTE — Progress Notes (Signed)
Patient comes in today for a testosterone injection. Administered in right upper outer quadrant IM.Patient tolerated well.

## 2019-09-05 NOTE — Progress Notes (Signed)
Patient comes in today for pre physical labs and EKG. Patient is scheduled on 09/13/2019.

## 2019-09-06 ENCOUNTER — Ambulatory Visit: Payer: 59

## 2019-09-06 ENCOUNTER — Other Ambulatory Visit: Payer: Self-pay

## 2019-09-06 DIAGNOSIS — Z0000 Encounter for general adult medical examination without abnormal findings: Secondary | ICD-10-CM

## 2019-09-06 LAB — POCT URINALYSIS DIPSTICK
Bilirubin, UA: NEGATIVE
Blood, UA: NEGATIVE
Glucose, UA: NEGATIVE
Ketones, UA: NEGATIVE
Leukocytes, UA: NEGATIVE
Nitrite, UA: NEGATIVE
Protein, UA: NEGATIVE
Spec Grav, UA: 1.02 (ref 1.010–1.025)
Urobilinogen, UA: 0.2 E.U./dL
pH, UA: 6.5 (ref 5.0–8.0)

## 2019-09-07 LAB — CMP12+LP+TP+TSH+6AC+PSA+CBC…
ALT: 41 IU/L (ref 0–44)
AST: 34 IU/L (ref 0–40)
Albumin/Globulin Ratio: 1.8 (ref 1.2–2.2)
Albumin: 4.4 g/dL (ref 4.0–5.0)
Alkaline Phosphatase: 69 IU/L (ref 39–117)
BUN/Creatinine Ratio: 15 (ref 9–20)
BUN: 16 mg/dL (ref 6–24)
Basophils Absolute: 0 10*3/uL (ref 0.0–0.2)
Basos: 0 %
Bilirubin Total: 0.6 mg/dL (ref 0.0–1.2)
Calcium: 9.2 mg/dL (ref 8.7–10.2)
Chloride: 105 mmol/L (ref 96–106)
Chol/HDL Ratio: 3.4 ratio (ref 0.0–5.0)
Cholesterol, Total: 123 mg/dL (ref 100–199)
Creatinine, Ser: 1.04 mg/dL (ref 0.76–1.27)
EOS (ABSOLUTE): 0.1 10*3/uL (ref 0.0–0.4)
Eos: 1 %
Estimated CHD Risk: 0.5 times avg. (ref 0.0–1.0)
Free Thyroxine Index: 2.9 (ref 1.2–4.9)
GFR calc Af Amer: 97 mL/min/{1.73_m2} (ref >59)
GFR calc non Af Amer: 84 mL/min/{1.73_m2} (ref >59)
GGT: 20 IU/L (ref 0–65)
Globulin, Total: 2.4 g/dL (ref 1.5–4.5)
Glucose: 98 mg/dL (ref 65–99)
HDL: 36 mg/dL — ABNORMAL LOW (ref >39)
Hematocrit: 48.4 % (ref 37.5–51.0)
Hemoglobin: 16.3 g/dL (ref 13.0–17.7)
Immature Grans (Abs): 0 10*3/uL (ref 0.0–0.1)
Immature Granulocytes: 1 %
Iron: 96 ug/dL (ref 38–169)
LDH: 204 IU/L (ref 121–224)
LDL Chol Calc (NIH): 73 mg/dL (ref 0–99)
Lymphocytes Absolute: 1.6 10*3/uL (ref 0.7–3.1)
Lymphs: 22 %
MCH: 28.1 pg (ref 26.6–33.0)
MCHC: 33.7 g/dL (ref 31.5–35.7)
MCV: 83 fL (ref 79–97)
Monocytes Absolute: 0.4 10*3/uL (ref 0.1–0.9)
Monocytes: 6 %
Neutrophils Absolute: 5.3 10*3/uL (ref 1.4–7.0)
Neutrophils: 70 %
Phosphorus: 2.3 mg/dL — ABNORMAL LOW (ref 2.8–4.1)
Platelets: 349 10*3/uL (ref 150–450)
Potassium: 4.5 mmol/L (ref 3.5–5.2)
Prostate Specific Ag, Serum: 0.5 ng/mL (ref 0.0–4.0)
RBC: 5.81 x10E6/uL — ABNORMAL HIGH (ref 4.14–5.80)
RDW: 13.9 % (ref 11.6–15.4)
Sodium: 140 mmol/L (ref 134–144)
T3 Uptake Ratio: 29 % (ref 24–39)
T4, Total: 10 ug/dL (ref 4.5–12.0)
TSH: 2.56 u[IU]/mL (ref 0.450–4.500)
Total Protein: 6.8 g/dL (ref 6.0–8.5)
Triglycerides: 70 mg/dL (ref 0–149)
Uric Acid: 6.7 mg/dL (ref 3.8–8.4)
VLDL Cholesterol Cal: 14 mg/dL (ref 5–40)
WBC: 7.5 10*3/uL (ref 3.4–10.8)

## 2019-09-26 ENCOUNTER — Ambulatory Visit: Payer: 59 | Admitting: Physician Assistant

## 2019-09-26 ENCOUNTER — Other Ambulatory Visit: Payer: Self-pay

## 2019-09-26 VITALS — BP 124/81 | HR 78 | Temp 98.1°F | Ht 73.0 in | Wt 319.0 lb

## 2019-09-26 DIAGNOSIS — Z0000 Encounter for general adult medical examination without abnormal findings: Secondary | ICD-10-CM

## 2019-09-26 DIAGNOSIS — I10 Essential (primary) hypertension: Secondary | ICD-10-CM

## 2019-09-26 DIAGNOSIS — E291 Testicular hypofunction: Secondary | ICD-10-CM

## 2019-09-26 NOTE — Progress Notes (Signed)
Administered 0.74ml Testosterone in Left Upper Outer Quadrant. Patient tolerated well.

## 2019-09-26 NOTE — Progress Notes (Signed)
   Subjective: Annual exam    Patient ID: Jeffrey Carlson, male    DOB: 05-06-1971, 49 y.o.   MRN: [id number]  HPI Patient presents for annual exam voices no complaints.  Patient state recent prostate exam done by urologist last month.   Review of Systems    Morbid obesity, GERD, hypertension, and hypogonadism. Objective:   Physical Exam No acute distress.  Morbid obesity. HEENT unremarkable. Neck supple without adenopathy or bruits. Lungs are clear to auscultation. Heart regular rate and rhythm. Abdomen obese distention normoactive bowel sounds, soft nontender to palpation. Neurologic deformity upper lower extremities, full and equal range of motion of the upper and lower extremities. No obvious deformity to cervical lumbar spine.  Patient is full neck range of motion cervical lumbar spine. Cranial nerves II through XII grossly intact. Review of labs shows continue low phosphorus.       Assessment & Plan: Well exam.  Morbid obesity. Hypophosphatemia.

## 2019-10-12 ENCOUNTER — Telehealth: Payer: Self-pay | Admitting: Registered Nurse

## 2019-10-12 ENCOUNTER — Encounter: Payer: Self-pay | Admitting: Registered Nurse

## 2019-10-12 DIAGNOSIS — F988 Other specified behavioral and emotional disorders with onset usually occurring in childhood and adolescence: Secondary | ICD-10-CM

## 2019-10-12 DIAGNOSIS — F900 Attention-deficit hyperactivity disorder, predominantly inattentive type: Secondary | ICD-10-CM

## 2019-10-12 MED ORDER — AMPHETAMINE-DEXTROAMPHET ER 20 MG PO CP24: 20.0000 mg | ORAL_CAPSULE | Freq: Every day | ORAL | 0 refills | Status: DC

## 2019-10-12 NOTE — Telephone Encounter (Signed)
Iron level normal 09/06/2019.  No ferrous sulfate noted on patient medication list.  I do not have access to patient paper chart at this time.  Attempted to reach patient via telephone to verify he requested refill and was taking for Feb 2021 labs but no answer left message for him to contact clinic.  Per Epic med tab discontinued by C[facility] [place] on 06/30/2019.

## 2019-10-12 NOTE — Telephone Encounter (Signed)
Patient returned call hasn't been taking iron supplement and doesn't need refill on it but does need adderall XR 20mg  po daily.  Reviewed  PMP website and last fill by PA Smith on 09/04/2018 30 tabs.  Last office visit 09/26/2019 BP 124/81 and pulse 78  Next face to face due 12/27/2019.  Electronic Rx sent to his pharmacy of choice.

## 2019-10-19 ENCOUNTER — Ambulatory Visit: Payer: Self-pay

## 2019-10-25 ENCOUNTER — Ambulatory Visit: Payer: Self-pay

## 2019-10-25 ENCOUNTER — Other Ambulatory Visit: Payer: Self-pay

## 2019-10-25 DIAGNOSIS — E291 Testicular hypofunction: Secondary | ICD-10-CM

## 2019-10-26 ENCOUNTER — Encounter: Payer: Self-pay | Admitting: Registered Nurse

## 2019-10-26 ENCOUNTER — Ambulatory Visit: Payer: Self-pay | Admitting: Registered Nurse

## 2019-10-26 VITALS — BP 140/82 | HR 101 | Temp 98.9°F | Resp 16

## 2019-10-26 DIAGNOSIS — R319 Hematuria, unspecified: Secondary | ICD-10-CM

## 2019-10-26 DIAGNOSIS — N3001 Acute cystitis with hematuria: Secondary | ICD-10-CM

## 2019-10-26 LAB — POCT URINALYSIS DIPSTICK
Bilirubin, UA: NEGATIVE
Blood, UA: POSITIVE
Glucose, UA: NEGATIVE
Ketones, UA: NEGATIVE
Nitrite, UA: NEGATIVE
Protein, UA: NEGATIVE
Spec Grav, UA: 1.015 (ref 1.010–1.025)
Urobilinogen, UA: 0.2 E.U./dL
pH, UA: 7.5 (ref 5.0–8.0)

## 2019-10-26 MED ORDER — TAMSULOSIN HCL 0.4 MG PO CAPS: 0.4000 mg | ORAL_CAPSULE | Freq: Every day | ORAL | 0 refills | Status: DC

## 2019-10-26 MED ORDER — SULFAMETHOXAZOLE-TRIMETHOPRIM 800-160 MG PO TABS: 1.0000 | ORAL_TABLET | Freq: Two times a day (BID) | ORAL | 0 refills | Status: AC

## 2019-10-26 NOTE — Patient Instructions (Signed)
Dietary Guidelines to Help Prevent Kidney Stones Kidney stones are deposits of minerals and salts that form inside your kidneys. Your risk of developing kidney stones may be greater depending on your diet, your lifestyle, the medicines you take, and whether you have certain medical conditions. Most people can reduce their chances of developing kidney stones by following the instructions below. Depending on your overall health and the type of kidney stones you tend to develop, your dietitian may give you more specific instructions. What are tips for following this plan? Reading food labels  Choose foods with "no salt added" or "low-salt" labels. Limit your sodium intake to less than 1500 mg per day.  Choose foods with calcium for each meal and snack. Try to eat about 300 mg of calcium at each meal. Foods that contain 200-500 mg of calcium per serving include: ? 8 oz (237 ml) of milk, fortified nondairy milk, and fortified fruit juice. ? 8 oz (237 ml) of kefir, yogurt, and soy yogurt. ? 4 oz (118 ml) of tofu. ? 1 oz of cheese. ? 1 cup (300 g) of dried figs. ? 1 cup (91 g) of cooked broccoli. ? 1-3 oz can of sardines or mackerel.  Most people need 1000 to 1500 mg of calcium each day. Talk to your dietitian about how much calcium is recommended for you. Shopping  Buy plenty of fresh fruits and vegetables. Most people do not need to avoid fruits and vegetables, even if they contain nutrients that may contribute to kidney stones.  When shopping for convenience foods, choose: ? Whole pieces of fruit. ? Premade salads with dressing on the side. ? Low-fat fruit and yogurt smoothies.  Avoid buying frozen meals or prepared deli foods.  Look for foods with live cultures, such as yogurt and kefir. Cooking  Do not add salt to food when cooking. Place a salt shaker on the table and allow each person to add his or her own salt to taste.  Use vegetable protein, such as beans, textured vegetable  protein (TVP), or tofu instead of meat in pasta, casseroles, and soups. Meal planning   Eat less salt, if told by your dietitian. To do this: ? Avoid eating processed or premade food. ? Avoid eating fast food.  Eat less animal protein, including cheese, meat, poultry, or fish, if told by your dietitian. To do this: ? Limit the number of times you have meat, poultry, fish, or cheese each week. Eat a diet free of meat at least 2 days a week. ? Eat only one serving each day of meat, poultry, fish, or seafood. ? When you prepare animal protein, cut pieces into small portion sizes. For most meat and fish, one serving is about the size of one deck of cards.  Eat at least 5 servings of fresh fruits and vegetables each day. To do this: ? Keep fruits and vegetables on hand for snacks. ? Eat 1 piece of fruit or a handful of berries with breakfast. ? Have a salad and fruit at lunch. ? Have two kinds of vegetables at dinner.  Limit foods that are high in a substance called oxalate. These include: ? Spinach. ? Rhubarb. ? Beets. ? Potato chips and french fries. ? Nuts.  If you regularly take a diuretic medicine, make sure to eat at least 1-2 fruits or vegetables high in potassium each day. These include: ? Avocado. ? Banana. ? Orange, prune, carrot, or tomato juice. ? Baked potato. ? Cabbage. ? Beans and split   peas. General instructions   Drink enough fluid to keep your urine clear or pale yellow. This is the most important thing you can do.  Talk to your health care provider and dietitian about taking daily supplements. Depending on your health and the cause of your kidney stones, you may be advised: ? Not to take supplements with vitamin C. ? To take a calcium supplement. ? To take a daily probiotic supplement. ? To take other supplements such as magnesium, fish oil, or vitamin B6.  Take all medicines and supplements as told by your health care provider.  Limit alcohol intake to no  more than 1 drink a day for nonpregnant women and 2 drinks a day for men. One drink equals 12 oz of beer, 5 oz of wine, or 1 oz of hard liquor.  Lose weight if told by your health care provider. Work with your dietitian to find strategies and an eating plan that works best for you. What foods are not recommended? Limit your intake of the following foods, or as told by your dietitian. Talk to your dietitian about specific foods you should avoid based on the type of kidney stones and your overall health. Grains Breads. Bagels. Rolls. Baked goods. Salted crackers. Cereal. Pasta. Vegetables Spinach. Rhubarb. Beets. Canned vegetables. Jeffrey Carlson. Olives. Meats and other protein foods Nuts. Nut butters. Large portions of meat, poultry, or fish. Salted or cured meats. Deli meats. Hot dogs. Sausages. Dairy Cheese. Beverages Regular soft drinks. Regular vegetable juice. Seasonings and other foods Seasoning blends with salt. Salad dressings. Canned soups. Soy sauce. Ketchup. Barbecue sauce. Canned pasta sauce. Casseroles. Pizza. Lasagna. Frozen meals. Potato chips. [place] fries. Summary  You can reduce your risk of kidney stones by making changes to your diet.  The most important thing you can do is drink enough fluid. You should drink enough fluid to keep your urine clear or pale yellow.  Ask your health care provider or dietitian how much protein from animal sources you should eat each day, and also how much salt and calcium you should have each day. This information is not intended to replace advice given to you by your health care provider. Make sure you discuss any questions you have with your health care provider. Document Revised: 10/20/2018 Document Reviewed: 06/10/2016 Elsevier Patient Education  2020 [place]. Kidney Stones  Kidney stones are solid, rock-like deposits that form inside of the kidneys. The kidneys are a pair of organs that make urine. A kidney stone may form in a kidney  and move into other parts of the urinary tract, including the tubes that connect the kidneys to the bladder (ureters), the bladder, and the tube that carries urine out of the body (urethra). As the stone moves through these areas, it can cause intense pain and block the flow of urine. Kidney stones are created when high levels of certain minerals are found in the urine. The stones are usually passed out of the body through urination, but in some cases, medical treatment may be needed to remove them. What are the causes? Kidney stones may be caused by:  A condition in which certain glands produce too much parathyroid hormone (primary hyperparathyroidism), which causes too much calcium buildup in the blood.  A buildup of uric acid crystals in the bladder (hyperuricosuria). Uric acid is a chemical that the body produces when you eat certain foods. It usually exits the body in the urine.  Narrowing (stricture) of one or both of the ureters.  A  kidney blockage that is present at birth (congenital obstruction).  Past surgery on the kidney or the ureters, such as gastric bypass surgery. What increases the risk? The following factors may make you more likely to develop this condition:  Having had a kidney stone in the past.  Having a family history of kidney stones.  Not drinking enough water.  Eating a diet that is high in protein, salt (sodium), or sugar.  Being overweight or obese. What are the signs or symptoms? Symptoms of a kidney stone may include:  Pain in the side of the abdomen, right below the ribs (flank pain). Pain usually spreads (radiates) to the groin.  Needing to urinate frequently or urgently.  Painful urination.  Blood in the urine (hematuria).  Nausea.  Vomiting.  Fever and chills. How is this diagnosed? This condition may be diagnosed based on:  Your symptoms and medical history.  A physical exam.  Blood tests.  Urine tests. These may be done before and  after the stone passes out of your body through urination.  Imaging tests, such as a CT scan, abdominal X-ray, or ultrasound.  A procedure to examine the inside of the bladder (cystoscopy). How is this treated? Treatment for kidney stones depends on the size, location, and makeup of the stones. Kidney stones will often pass out of the body through urination. You may need to:  Increase your fluid intake to help pass the stone. In some cases, you may be given fluids through an IV and may need to be monitored at the hospital.  Take medicine for pain.  Make changes in your diet to help prevent kidney stones from coming back. Sometimes, medical procedures are needed to remove a kidney stone. This may involve:  A procedure to break up kidney stones using: ? A focused beam of light (laser therapy). ? Shock waves (extracorporeal shock wave lithotripsy).  Surgery to remove kidney stones. This may be needed if you have severe pain or have stones that block your urinary tract. Follow these instructions at home: Medicines  Take over-the-counter and prescription medicines only as told by your health care provider.  Ask your health care provider if the medicine prescribed to you requires you to avoid driving or using heavy machinery. Eating and drinking  Drink enough fluid to keep your urine pale yellow. You may be instructed to drink at least 8-10 glasses of water each day. This will help you pass the kidney stone.  If directed, change your diet. This may include: ? Limiting how much sodium you eat. ? Eating more fruits and vegetables. ? Limiting how much animal protein--such as red meat, poultry, fish, and eggs--you eat.  Follow instructions from your health care provider about eating or drinking restrictions. General instructions  Collect urine samples as told by your health care provider. You may need to collect a urine sample: ? 24 hours after you pass the stone. ? 8-12 weeks after  passing the kidney stone, and every 6-12 months after that.  Strain your urine every time you urinate, for as long as directed. Use the strainer that your health care provider recommends.  Do not throw out the kidney stone after passing it. Keep the stone so it can be tested by your health care provider. Testing the makeup of your kidney stone may help prevent you from getting kidney stones in the future.  Keep all follow-up visits as told by your health care provider. This is important. You may need follow-up X-rays or  ultrasounds to make sure that your stone has passed. How is this prevented? To prevent another kidney stone:  Drink enough fluid to keep your urine pale yellow. This is the best way to prevent kidney stones.  Eat a healthy diet and follow recommendations from your health care provider about foods to avoid. You may be instructed to eat a low-protein diet. Recommendations vary depending on the type of kidney stone that you have.  Maintain a healthy weight. Where to find more information  [place] ([facility]): www.kidney.[facility]): www.urologyhealth.org Contact a health care provider if:  You have pain that gets worse or does not get better with medicine. Get help right away if:  You have a fever or chills.  You develop severe pain.  You develop new abdominal pain.  You faint.  You are unable to urinate. Summary  Kidney stones are solid, rock-like deposits that form inside of the kidneys.  Kidney stones can cause nausea, vomiting, blood in the urine, abdominal pain, and the urge to urinate frequently.  Treatment for kidney stones depends on the size, location, and makeup of the stones. Kidney stones will often pass out of the body through urination.  Kidney stones can be prevented by drinking enough fluids, eating a healthy diet, and maintaining a healthy weight. This information is not intended to replace advice given to  you by your health care provider. Make sure you discuss any questions you have with your health care provider. Document Revised: 11/16/2018 Document Reviewed: 11/16/2018 Elsevier Patient Education  [place]. Prostatitis  Prostatitis is swelling or inflammation of the prostate gland. The prostate is a walnut-sized gland that is involved in the production of semen. It is located below a man's bladder, in front of the rectum. There are four types of prostatitis:  Chronic nonbacterial prostatitis. This is the most common type of prostatitis. It may be associated with a viral infection or autoimmune disorder.  Acute bacterial prostatitis. This is the least common type of prostatitis. It starts quickly and is usually associated with a bladder infection, high fever, and shaking chills. It can occur at any age.  Chronic bacterial prostatitis. This type usually results from acute bacterial prostatitis that happens repeatedly (is recurrent) or has not been treated properly. It can occur in men of any age but is most common among middle-aged men whose prostate has begun to get larger. The symptoms are not as severe as symptoms caused by acute bacterial prostatitis.  Prostatodynia or chronic pelvic pain syndrome (CPPS). This type is also called pelvic floor disorder. It is associated with increased muscular tone in the pelvis surrounding the prostate. What are the causes? Bacterial prostatitis is caused by infection from bacteria. Chronic nonbacterial prostatitis may be caused by:  Urinary tract infections (UTIs).  Nerve damage.  A response by the body's disease-fighting system (autoimmune response).  Chemicals in the urine. The causes of the other types of prostatitis are usually not known. What are the signs or symptoms? Symptoms of this condition vary depending upon the type of prostatitis. If you have acute bacterial prostatitis, you may experience:  Urinary symptoms, such  as: ? Painful urination. ? Burning during urination. ? Frequent and sudden urges to urinate. ? Inability to start urinating. ? A weak or interrupted stream of urine.  Vomiting.  Nausea.  Fever.  Chills.  Inability to empty the bladder completely.  Pain in the: ? Muscles or joints. ? Lower back. ? Lower abdomen. If  you have any of the other types of prostatitis, you may experience:  Urinary symptoms, such as: ? Sudden urges to urinate. ? Frequent urination. ? Difficulty starting urination. ? Weak urine stream. ? Dribbling after urination.  Discharge from the urethra. The urethra is a tube that opens at the end of the penis.  Pain in the: ? Testicles. ? Penis or tip of the penis. ? Rectum. ? Area in front of the rectum and below the scrotum (perineum).  Problems with sexual function.  Painful ejaculation.  Bloody semen. How is this diagnosed? This condition may be diagnosed based on:  A physical and medical exam.  Your symptoms.  A urine test to check for bacteria.  An exam in which a health care provider uses a finger to feel the prostate (digital rectal exam).  A test of a sample of semen.  Blood tests.  Ultrasound.  Removal of prostate tissue to be examined under a microscope (biopsy).  Tests to check how your body handles urine (urodynamic tests).  A test to look inside your bladder or urethra (cystoscopy). How is this treated? Treatment for this condition depends on the type of prostatitis. Treatment may involve:  Medicines to relieve pain or inflammation.  Medicines to help relax your muscles.  Physical therapy.  Heat therapy.  Techniques to help you control certain body functions (biofeedback).  Relaxation exercises.  Antibiotic medicine, if your condition is caused by bacteria.  Warm water baths (sitz baths). Sitz baths help with relaxing your pelvic floor muscles, which helps to relieve pressure on the prostate. Follow these  instructions at home:   Take over-the-counter and prescription medicines only as told by your health care provider.  If you were prescribed an antibiotic, take it as told by your health care provider. Do not stop taking the antibiotic even if you start to feel better.  If physical therapy, biofeedback, or relaxation exercises were prescribed, do exercises as instructed.  Take sitz baths as directed by your health care provider. For a sitz bath, sit in warm water that is deep enough to cover your hips and buttocks.  Keep all follow-up visits as told by your health care provider. This is important. Contact a health care provider if:  Your symptoms get worse.  You have a fever. Get help right away if:  You have chills.  You feel nauseous.  You vomit.  You feel light-headed or feel like you are going to faint.  You are unable to urinate.  You have blood or blood clots in your urine. This information is not intended to replace advice given to you by your health care provider. Make sure you discuss any questions you have with your health care provider. Document Revised: 09/12/2017 Document Reviewed: 03/20/2016 Elsevier Patient Education  [place]. Urinary Tract Infection, Adult  A urinary tract infection (UTI) is an infection of any part of the urinary tract. The urinary tract includes the kidneys, ureters, bladder, and urethra. These organs make, store, and get rid of urine in the body. Your health care provider may use other names to describe the infection. An upper UTI affects the ureters and kidneys (pyelonephritis). A lower UTI affects the bladder (cystitis) and urethra (urethritis). What are the causes? Most urinary tract infections are caused by bacteria in your genital area, around the entrance to your urinary tract (urethra). These bacteria grow and cause inflammation of your urinary tract. What increases the risk? You are more likely to develop this condition  if:  You have a urinary catheter that stays in place (indwelling).  You are not able to control when you urinate or have a bowel movement (you have incontinence).  You are male and you: ? Use a spermicide or diaphragm for birth control. ? Have low estrogen levels. ? Are pregnant.  You have certain genes that increase your risk (genetics).  You are sexually active.  You take antibiotic medicines.  You have a condition that causes your flow of urine to slow down, such as: ? An enlarged prostate, if you are male. ? Blockage in your urethra (stricture). ? A kidney stone. ? A nerve condition that affects your bladder control (neurogenic bladder). ? Not getting enough to drink, or not urinating often.  You have certain medical conditions, such as: ? Diabetes. ? A weak disease-fighting system (immunesystem). ? Sickle cell disease. ? Gout. ? Spinal cord injury. What are the signs or symptoms? Symptoms of this condition include:  Needing to urinate right away (urgently).  Frequent urination or passing small amounts of urine frequently.  Pain or burning with urination.  Blood in the urine.  Urine that smells bad or unusual.  Trouble urinating.  Cloudy urine.  Vaginal discharge, if you are male.  Pain in the abdomen or the lower back. You may also have:  Vomiting or a decreased appetite.  Confusion.  Irritability or tiredness.  A fever.  Diarrhea. The first symptom in older adults may be confusion. In some cases, they may not have any symptoms until the infection has worsened. How is this diagnosed? This condition is diagnosed based on your medical history and a physical exam. You may also have other tests, including:  Urine tests.  Blood tests.  Tests for sexually transmitted infections (STIs). If you have had more than one UTI, a cystoscopy or imaging studies may be done to determine the cause of the infections. How is this treated? Treatment for  this condition includes:  Antibiotic medicine.  Over-the-counter medicines to treat discomfort.  Drinking enough water to stay hydrated. If you have frequent infections or have other conditions such as a kidney stone, you may need to see a health care provider who specializes in the urinary tract (urologist). In rare cases, urinary tract infections can cause sepsis. Sepsis is a life-threatening condition that occurs when the body responds to an infection. Sepsis is treated in the hospital with IV antibiotics, fluids, and other medicines. Follow these instructions at home:  Medicines  Take over-the-counter and prescription medicines only as told by your health care provider.  If you were prescribed an antibiotic medicine, take it as told by your health care provider. Do not stop using the antibiotic even if you start to feel better. General instructions  Make sure you: ? Empty your bladder often and completely. Do not hold urine for long periods of time. ? Empty your bladder after sex. ? Wipe from front to back after a bowel movement if you are male. Use each tissue one time when you wipe.  Drink enough fluid to keep your urine pale yellow.  Keep all follow-up visits as told by your health care provider. This is important. Contact a health care provider if:  Your symptoms do not get better after 1-2 days.  Your symptoms go away and then return. Get help right away if you have:  Severe pain in your back or your lower abdomen.  A fever.  Nausea or vomiting. Summary  A urinary tract infection (UTI) is  an infection of any part of the urinary tract, which includes the kidneys, ureters, bladder, and urethra.  Most urinary tract infections are caused by bacteria in your genital area, around the entrance to your urinary tract (urethra).  Treatment for this condition often includes antibiotic medicines.  If you were prescribed an antibiotic medicine, take it as told by your  health care provider. Do not stop using the antibiotic even if you start to feel better.  Keep all follow-up visits as told by your health care provider. This is important. This information is not intended to replace advice given to you by your health care provider. Make sure you discuss any questions you have with your health care provider. Document Revised: 06/17/2018 Document Reviewed: 01/07/2018 Elsevier Patient Education  2020 [employer].

## 2019-10-26 NOTE — Progress Notes (Signed)
Subjective:    Patient ID: Jeffrey Carlson, male    DOB: 01/19/1971, 49 y.o.   MRN: [id number]  48y/o caucasian male established patient here for evaluation recurrent dysuria.  History prostatitis and UTI treated with bactrim DS po BID x 28 days Dr [facility] urology 2020.  Required a rocephin shot with last UTI  Symptoms had completely resolved.  Works for [employer]  Patient reported "I waited too long"  I haven't been drinking as much fluid the past week.  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  Having to get up at night to urinate.  Unable to hold urine like usual notes if he waits too long having small amounts urinary incontinence especially if active not sitting  Saturday hand and feet swelling noticed along with back pain and darker colored urine than his normal.  Denied fever/chills/n/v/d or gross hematuria.  Previous urine culture + e coli no resistance per lab report 2020 x 2 grew e.coli  2021 negative growth Jan.  Denied seeing stones in toilet.  He also noted father recently had infection urinary.  Reviewed RN Newberry note patient sx and PMHx.     Review of Systems  Constitutional: Positive for activity change. Negative for appetite change, chills, diaphoresis, fatigue, fever and unexpected weight change.  HENT: Negative for trouble swallowing and voice change.   Eyes: Negative for photophobia and visual disturbance.  Respiratory: Negative for cough, shortness of breath, wheezing and stridor.   Cardiovascular: Positive for leg swelling. Negative for chest pain and palpitations.  Gastrointestinal: Negative for abdominal distention, abdominal pain, blood in stool, constipation, diarrhea, nausea and vomiting.  Endocrine: Negative for polydipsia, polyphagia and polyuria.  Genitourinary: Positive for  decreased urine volume, dysuria, frequency and urgency. Negative for difficulty urinating, discharge, genital sores, penile pain, penile swelling, scrotal swelling and testicular pain.  Musculoskeletal: Positive for back pain. Negative for gait problem, joint swelling, neck pain and neck stiffness.  Skin: Negative for color change and rash.  Allergic/Immunologic: Negative for food allergies.  Neurological: Negative for dizziness, tremors, seizures, syncope, facial asymmetry, speech difficulty, weakness, light-headedness, numbness and headaches.  Hematological: Negative for adenopathy. Does not bruise/bleed easily.  Psychiatric/Behavioral: Positive for sleep disturbance. Negative for agitation and confusion.       Objective:   Physical Exam Vitals and nursing note reviewed.  Constitutional:      General: He is awake. He is not in acute distress.    Appearance: Normal appearance. He is well-developed and well-groomed. He is obese. He is not ill-appearing, toxic-appearing or diaphoretic.  HENT:     Head: Normocephalic and atraumatic.     Jaw: There is normal jaw occlusion.     Right Ear: Hearing and external ear normal.     Left Ear: Hearing and external ear normal.     Nose: Nose normal.     Mouth/Throat:     Pharynx: Oropharynx is clear.  Eyes:     General: Lids are normal. Vision grossly intact. Gaze aligned appropriately. No allergic shiner, visual field deficit or scleral icterus.       Right eye: No discharge.        Left eye: No discharge.     Extraocular Movements: Extraocular movements intact.     Conjunctiva/sclera: Conjunctivae normal.     Pupils:  Pupils are equal, round, and reactive to light.  Neck:     Trachea: Trachea and phonation normal.  Cardiovascular:     Rate and Rhythm: Regular rhythm. Tachycardia present.     Pulses: Normal pulses.          Radial pulses are 2+ on the right side and 2+ on the left side.  Pulmonary:     Effort: Pulmonary effort is normal. No  respiratory distress.     Breath sounds: Normal breath sounds and air entry. No stridor, decreased air movement or transmitted upper airway sounds. No decreased breath sounds, wheezing, rhonchi or rales.     Comments: No cough observed in exam room; wearing cloth mask due to covid 19 pandemic; spoke full sentences without difficulty Abdominal:     General: Abdomen is flat. Bowel sounds are decreased. There is no distension.     Palpations: Abdomen is soft. There is no shifting dullness, fluid wave, hepatomegaly, splenomegaly, mass or pulsatile mass.     Tenderness: There is no abdominal tenderness. There is no right CVA tenderness, left CVA tenderness, guarding or rebound. Negative signs include Murphy's sign.     Hernia: No hernia is present. There is no hernia in the umbilical area.  Musculoskeletal:        General: No swelling, tenderness, deformity or signs of injury.     Right shoulder: Normal.     Left shoulder: Normal.     Right elbow: Normal.     Left elbow: Normal.     Right hand: Normal.     Left hand: Normal.     Cervical back: Normal, normal range of motion and neck supple. No swelling, edema, deformity, erythema, signs of trauma, lacerations, rigidity, spasms, torticollis, tenderness, bony tenderness or crepitus. No pain with movement. Normal range of motion.     Thoracic back: Normal. No swelling, edema, deformity, signs of trauma, lacerations, spasms, tenderness or bony tenderness. Normal range of motion. No scoliosis.     Lumbar back: No swelling, edema, deformity, signs of trauma, lacerations, spasms, tenderness or bony tenderness. Decreased range of motion. No scoliosis.       Back:     Right lower leg: 1+ Edema present.     Left lower leg: 1+ Edema present.  Lymphadenopathy:     Head:     Right side of head: No submental or preauricular adenopathy.     Left side of head: No submental or preauricular adenopathy.     Cervical: No cervical adenopathy.     Right cervical:  No superficial cervical adenopathy.    Left cervical: No superficial cervical adenopathy.  Skin:    General: Skin is warm and dry.     Capillary Refill: Capillary refill takes less than 2 seconds.     Coloration: Skin is not ashen, cyanotic, jaundiced, mottled, pale or sallow.     Findings: No abrasion, abscess, acne, bruising, burn, ecchymosis, erythema, signs of injury, laceration, lesion, petechiae, rash or wound.     Nails: There is no clubbing.  Neurological:     General: No focal deficit present.     Mental Status: He is alert and oriented to person, place, and time. Mental status is at baseline.     GCS: GCS eye subscore is 4. GCS verbal subscore is 5. GCS motor subscore is 6.     Cranial Nerves: Cranial nerves are intact. No cranial nerve deficit, dysarthria or facial asymmetry.     Sensory: Sensation is intact. No  sensory deficit.     Motor: Motor function is intact. No weakness, tremor, atrophy, abnormal muscle tone or seizure activity.     Coordination: Coordination is intact. Coordination normal.     Gait: Gait is intact. Gait normal.     Comments: On/off exam table and in/out of chair without difficulty; gait sure and steady in clinic; bilateral hand grasp equal 5/5  Psychiatric:        Attention and Perception: Attention and perception normal.        Mood and Affect: Mood and affect normal.        Speech: Speech normal.        Behavior: Behavior normal. Behavior is cooperative.        Thought Content: Thought content normal.        Cognition and Memory: Cognition and memory normal.        Judgment: Judgment normal.      Dipstick urinalysis results discussed verbally with patient. Small leukocytes, cloudy, + RBCs  Discussed indicative of infection  Patient verbalized understanding information and had no further questions at this time. Results for DIQUAN, CHEHAB "Crary" (MRN [id number]) as of 10/27/2019 08:36  Ref. Range 06/30/2019 08:34 07/05/2019 14:56 08/09/2019 15:33  09/06/2019 09:27 10/26/2019 13:23  Appearance Unknown     Pend  Appearance Ur Latest Ref Range: Clear  Clear      Bilirubin, UA Unknown Negative Negative negative negative Negative  Clarity, UA Unknown  Cloudy clear clear Cloudy  Color, UA Unknown Yellow Dark Yellow yellow yellow Yellow  Glucose Latest Ref Range: Negative   Negative Negative Negative Negative  Glucose, UA Latest Ref Range: Negative  Negative      Ketones, UA Unknown Negative Negative negative negative Negative  Leukocytes,UA Latest Ref Range: Negative  Negative Large (3+) (A) Negative Negative Small (1+) (A)  Nitrite, UA Unknown Negative Positive negative negative Negative  pH, UA Latest Ref Range: 5.0 - 8.0  6.5 6.0 6.5 6.5 7.5  Protein,UA Latest Ref Range: Negative  Negative Negative Negative Negative Negative  Specific Gravity, UA Latest Ref Range: 1.010 - 1.025  1.025 >=1.030 (A) 1.020 1.020 1.015  Urobilinogen, UA Latest Ref Range: 0.2 or 1.0 E.U./dL  0.2 0.2 0.2 0.2  Bacteria, UA Latest Ref Range: None seen/Few  None seen      Epithelial Cells (non renal) Latest Ref Range: 0 - 10 /hpf 0-10      Microscopic Examination Unknown See below:      RBC, UA Unknown Negative Positive negative negative Positive  WBC, UA Latest Ref Range: 0 - 5 /hpf 0-5      Urobilinogen, Ur Latest Ref Range: 0.2 - 1.0 mg/dL 1.0         Status:  Final result Visible to patient:  Yes (MyChart) Next appt:  06/29/2020 at 02:15 PM in Urology Jeffrey Sons, MD) Dx:  Hematuria, unspecified type Specimen Information: Urine   URINE       (important suggestion)  Newer results are available. Click to view them now.  Component 3 mo ago  Urine Culture, Routine Final reportAbnormal    Organism ID, Bacteria Escherichia coliAbnormal    Comment: Greater than 100,000 colony forming units per mL  Cefazolin <=4 ug/mL  Cefazolin with an MIC <=16 predicts susceptibility to the oral agents  cefaclor, cefdinir, cefpodoxime, cefprozil, cefuroxime,  cephalexin,  and loracarbef when used for therapy of uncomplicated urinary tract  infections due to E. coli, Klebsiella pneumoniae, and Proteus  mirabilis.  Antimicrobial Susceptibility Comment   Comment:   ** S = Susceptible; I = Intermediate; R = Resistant **           P = Positive; N = Negative        MICS are expressed in micrograms per mL   Antibiotic         RSLT#1  RSLT#2  RSLT#3  RSLT#4  Amoxicillin/Clavulanic Acid  S  Ampicillin           S  Cefepime            S  Ceftriaxone          S  Cefuroxime           S  Ciprofloxacin         S  Ertapenem           S  Gentamicin           S  Imipenem            S  Levofloxacin          S  Meropenem           S  Nitrofurantoin         S  Piperacillin/Tazobactam    S  Tetracycline          S  Tobramycin           S  Trimethoprim/Sulfa       S   Resulting Agency [facility]    Narrative Performed by: Jeffrey Carlson Performed at: [address] [place]  [address], [place], [place] [id number]  Lab Director: Rush Farmer MD, Phone: [phone]    Specimen Collected: 07/05/19 14:10 Last Resulted: 07/08/19 08:09       Results for JENO, DEBOCK "Byng" (MRN [id number]) as of 10/27/2019 08:36  Ref. Range 09/09/2013 08:32 05/05/2019 13:30 07/05/2019 14:10 08/09/2019 15:32  Organism ID, Bacteria Unknown  Escherichia coli (A) Escherichia coli (A) No growth  MRSA, PCR Latest Ref Range: NEGATIVE  NEGATIVE     Staphylococcus aureus Latest Ref Range: NEGATIVE  NEGATIVE     URINE CULTURE Unknown  Rpt (A) Rpt (A) Rpt   Assessment & Plan:  A-UTI with hematuria  P-dipstick urinalysis now x 1 and urine culture for ship out.  Patient using mychart stated can send him message with results once available typically 48-72 hours.  Start bactrim DS po BID x 28 days #56 RF0  electronic Rx sent to his pharmacy of choice to also cover possible prostatitis. Medications as directed. Patient is also to push fluidsHydrate to keep urine pale yellow clear and voiding every 2-3 hours while awake.  Avoid dehydration. Avoid holding urine; void on frequent basis and if urge to void.   If unable to void every 8 hours follow up for re-evaluation with PCM, urgent care or ER. Call or return to clinic as needed if these symptoms worsen or fail to improve as anticipated. Exitcare handout on prostatitis and UTI printed and given to patient  Patient verbalized agreement and understanding of treatment plan and had no further questions at this time.  P2: Hydrate and cranberry juice   Discussed with patient follow up with urology as has not had renal US and bladder scans with Dr Bernardo Heater office discussed last year but refused at that time.  This may have started with nephrolithiasis as symptoms consistent.  Discussed urinalysis results with patient. Urology appt to be scheduled as due for US renal and bladder scans  to see if any PVR.  Hydrate, hydrate. Flomax 0.4mg  daily as directed electronic Rx to pharmacy of choice #30 RF0.  Call or return to clinic as needed if these symptoms worsen or fail to improve as anticipated e.g. gross hematuria, fever, worsening pain, unable to void every 8 hours or tolerate po intake despite zofran and having symptoms of dehydration e.g. dizzyness, unable to void.  If fever chills malaise will give rocephin 1gm IM x 1 again as required in 2020.   RN Raenette Rover notified verbally of standing order available for use this week.  Exitcare handout on nephrolithiasis and foods to prevent nephrolithiasis printed and given to patient. Patient verbalized agreement and understanding of treatment plan and had no further questions at this time. P2: Hydrate, post coital urination, and cranberry juice

## 2019-10-26 NOTE — Progress Notes (Signed)
Last night burning with urination started & up multiple times during the night.  Had low back pain 1 week ago, but not now.  Had some pelvic discomfort.  Dr. Bernardo Heater treated him for prostatitis in 06/2019 with 3 wk regimen ABX See notes from 06/30/2019.  AMD

## 2019-10-29 LAB — URINE CULTURE

## 2019-11-10 ENCOUNTER — Other Ambulatory Visit: Payer: Self-pay

## 2019-11-10 DIAGNOSIS — E291 Testicular hypofunction: Secondary | ICD-10-CM

## 2019-11-10 MED ORDER — TESTOSTERONE CYPIONATE 200 MG/ML IJ SOLN: 100.0000 mg | INTRAMUSCULAR | 1 refills | Status: DC

## 2019-11-17 ENCOUNTER — Other Ambulatory Visit: Payer: Self-pay | Admitting: Registered Nurse

## 2019-11-17 DIAGNOSIS — R3915 Urgency of urination: Secondary | ICD-10-CM

## 2019-11-17 NOTE — Telephone Encounter (Signed)
[facility] pt. Thanks!

## 2019-11-24 ENCOUNTER — Other Ambulatory Visit: Payer: Self-pay

## 2019-11-24 DIAGNOSIS — F988 Other specified behavioral and emotional disorders with onset usually occurring in childhood and adolescence: Secondary | ICD-10-CM

## 2019-11-24 MED ORDER — AMPHETAMINE-DEXTROAMPHET ER 20 MG PO CP24: 20.0000 mg | ORAL_CAPSULE | Freq: Every day | ORAL | 0 refills | Status: DC

## 2019-12-02 ENCOUNTER — Ambulatory Visit: Payer: Self-pay

## 2019-12-02 ENCOUNTER — Other Ambulatory Visit: Payer: Self-pay

## 2019-12-02 VITALS — BP 121/84

## 2019-12-02 DIAGNOSIS — E291 Testicular hypofunction: Secondary | ICD-10-CM

## 2019-12-10 ENCOUNTER — Other Ambulatory Visit: Payer: Self-pay | Admitting: Registered Nurse

## 2019-12-10 DIAGNOSIS — M545 Low back pain, unspecified: Secondary | ICD-10-CM

## 2019-12-13 NOTE — Telephone Encounter (Signed)
[facility] pt. Thanks!

## 2019-12-21 ENCOUNTER — Other Ambulatory Visit: Payer: Self-pay | Admitting: Physician Assistant

## 2019-12-21 DIAGNOSIS — R3915 Urgency of urination: Secondary | ICD-10-CM

## 2019-12-22 ENCOUNTER — Other Ambulatory Visit: Payer: Self-pay | Admitting: Internal Medicine

## 2019-12-30 ENCOUNTER — Ambulatory Visit: Payer: Self-pay

## 2019-12-30 ENCOUNTER — Other Ambulatory Visit: Payer: Self-pay

## 2019-12-30 DIAGNOSIS — E291 Testicular hypofunction: Secondary | ICD-10-CM

## 2019-12-31 ENCOUNTER — Telehealth: Payer: 59 | Admitting: Physician Assistant

## 2019-12-31 DIAGNOSIS — R21 Rash and other nonspecific skin eruption: Secondary | ICD-10-CM

## 2019-12-31 DIAGNOSIS — L255 Unspecified contact dermatitis due to plants, except food: Secondary | ICD-10-CM | POA: Diagnosis not present

## 2019-12-31 MED ORDER — PREDNISONE 20 MG PO TABS: ORAL_TABLET | ORAL | 0 refills | Status: DC

## 2019-12-31 NOTE — Progress Notes (Signed)

## 2020-01-01 ENCOUNTER — Other Ambulatory Visit: Payer: Self-pay

## 2020-01-01 ENCOUNTER — Ambulatory Visit
Admission: EM | Admit: 2020-01-01 | Discharge: 2020-01-01 | Disposition: A | Discharge status: Home / Self Care | Payer: 59 | Attending: Emergency Medicine | Admitting: Emergency Medicine

## 2020-01-01 DIAGNOSIS — L239 Allergic contact dermatitis, unspecified cause: Secondary | ICD-10-CM

## 2020-01-01 DIAGNOSIS — Z23 Encounter for immunization: Secondary | ICD-10-CM

## 2020-01-01 MED ORDER — CEPHALEXIN 500 MG PO CAPS: 500.0000 mg | ORAL_CAPSULE | Freq: Four times a day (QID) | ORAL | 0 refills | Status: DC

## 2020-01-01 MED ORDER — TETANUS-DIPHTH-ACELL PERTUSSIS 5-2.5-18.5 LF-MCG/0.5 IM SUSP
0.5000 mL | Freq: Once | INTRAMUSCULAR | Status: AC
  Administered 2020-01-01: 0.5 mL via INTRAMUSCULAR

## 2020-01-01 NOTE — ED Triage Notes (Signed)
Patient states that he has a rash on his arms and body and was itchy but took benadryl last night and cleared everywhere but his left lower arm. Patient states that he was bit by something on this arm yesterday and he has noticed redness and pain. States that he did a e-visit last night and Rx'd Prednisone but he hasn't started that due to the pharmacy not being open. Patient reports that he is supposed to fly out today to [place].

## 2020-01-01 NOTE — ED Provider Notes (Signed)
[facility] - [place], [place]   Name: Jeffrey Carlson DOB: 25-Oct-1970 MRN: [id number] CSN: [id number] PCP: Patient, No Pcp Per  Arrival date and time:  01/01/20 5374  Chief Complaint:  Rash   NOTE: Prior to seeing the patient today, I have reviewed the triage nursing documentation and vital signs. Clinical staff has updated patient's PMH/PSHx, current medication list, and drug allergies/intolerances to ensure comprehensive history available to assist in medical decision making.   History:   HPI: Jeffrey Carlson is a 49 y.o. male who presents today with complaints of a rash.  He states he had multiple points of contact that could have caused a rash yesterday.  There is a chance he could have been due to poison oak, he felt that he was bitten by an insect, and he was working on his sump pump yesterday.  The rash started in the early evening.  An E-visit was completed and he was prescribed prednisone; however he was unable to start the medication due to the pharmacy not being open until 10 AM.  He took Benadryl last night to help with the itching and he noticed relief to most of his body except for his left lower arm.  He denies any pain to the area.  No known last tetanus.   Past Medical History:  Diagnosis Date  . Adult ADHD   . Arthritis   . Barrett esophagus   . Colon polyps   . Degenerative joint disease   . GERD (gastroesophageal reflux disease)   . Hiatal hernia   . Hypertension    dr Burt Ek   [place]  . Leukocytosis   . Obesity   . Polycythemia, secondary 12/20/2014  . Sleep apnea    cpap  . Testosterone deficiency   . Undescended right testicle     Past Surgical History:  Procedure Laterality Date  . APPENDECTOMY    . BACK SURGERY    . CHOLECYSTECTOMY    . COLONOSCOPY  06/2010   + small bowel capsule study  . EYE SURGERY     childhood  . KNEE SURGERY    . ROTATOR CUFF REPAIR     rt shoulder    Family History  Problem Relation Age of Onset    . Thyroid cancer Father   . Rheum arthritis Mother   . Abnormal EKG Mother   . Colon cancer Paternal Grandmother     Social History   Tobacco Use  . Smoking status: Never Smoker  . Smokeless tobacco: Former Systems developer    Types: Chew  Substance Use Topics  . Alcohol use: Yes    Alcohol/week: 0.0 standard drinks    Comment: occ  . Drug use: No    Patient Active Problem List   Diagnosis Date Noted  . Hypophosphatemia 09/26/2019  . Iron deficiency anemia 02/15/2019  . Hypogonadism in male 02/15/2019  . Essential hypertension 02/15/2019  . Gastroesophageal reflux disease 02/15/2019  . History of colonic polyps 02/15/2019  . Attention deficit hyperactivity disorder (ADHD) 02/15/2019  . Obesity, Class III, BMI 40-49.9 (morbid obesity) ([place]) 07/19/2018  . Osteoarthritis of knee 09/08/2017  . Atypical chest pain 01/18/2015  . Bundle branch block, right 01/18/2015  . Breath shortness 01/18/2015  . Polycythemia, secondary 12/20/2014  . Rotator cuff tear 10/10/2013    Home Medications:    Current Facility-Administered Medications for the 01/01/20 encounter [facility] Encounter)  Medication  . testosterone cypionate (DEPOTESTOSTERONE CYPIONATE) injection 100 mg   No outpatient medications have  been marked as taking for the 01/01/20 encounter [facility] Encounter).    Allergies:   Patient has no known allergies.  Review of Systems (ROS): Review of Systems  Constitutional: Negative for chills and fever.  Respiratory: Negative for cough and shortness of breath.   Cardiovascular: Negative for chest pain and palpitations.  Gastrointestinal: Negative for abdominal pain and vomiting.  Musculoskeletal: Negative for arthralgias and back pain.  Skin: Positive for color change and rash.  Neurological: Negative for seizures and syncope.  All other systems reviewed and are negative.    Vital Signs: Today's Vitals   01/01/20 0815  PainSc: 0-No pain    Physical Exam: Physical  Exam Vitals and nursing note reviewed.  Cardiovascular:     Pulses: Normal pulses.          Radial pulses are 2+ on the right side and 2+ on the left side.     Heart sounds: Normal heart sounds.  Pulmonary:     Effort: Pulmonary effort is normal.     Breath sounds: Normal breath sounds.  Skin:    General: Skin is warm.     Capillary Refill: Capillary refill takes less than 2 seconds.     Findings: Rash present.     Comments: Blanchable erythema to left lower extremity.  Left lower extremity is edematous but nonpitting.  There is no clear demarcation.   Neurological:     Mental Status: He is alert.      Urgent Care Treatments / Results:   LABS: PLEASE NOTE: all labs that were ordered this encounter are listed, however only abnormal results are displayed. Labs Reviewed - No data to display  EKG: -None  RADIOLOGY: No results found.  PROCEDURES: Procedures  MEDICATIONS RECEIVED THIS VISIT: Medications - No data to display  PERTINENT CLINICAL COURSE NOTES/UPDATES:   Initial Impression / Assessment and Plan / Urgent Care Course:  Pertinent labs & imaging results that were available during my care of the patient were personally reviewed by me and considered in my medical decision making (see lab/imaging section of note for values and interpretations).  [name] is a 49 y.o. male who presents to [facility] today with complaints of a rash, diagnosed with contact dermatitis, and treated as such with the guidance below. NP and patient reviewed discharge instructions below during visit.   Patient is well appearing overall in clinic today. He does not appear to be in any acute distress. Presenting symptoms (see HPI) and exam as documented above.   I have reviewed the follow up and strict return precautions for any new or worsening symptoms. Patient is aware of symptoms that would be deemed urgent/emergent, and would thus require further evaluation either here or in  the emergency department. At the time of discharge, he verbalized understanding and consent with the discharge plan as it was reviewed with him. All questions were fielded by provider and/or clinic staff prior to patient discharge.    Final Clinical Impressions / Urgent Care Diagnoses:   Final diagnoses:  Allergic contact dermatitis, unspecified trigger    New Prescriptions:  [place] Controlled Substance Registry consulted? Not Applicable  No orders of the defined types were placed in this encounter.   Discharge Instructions   None     Recommended Follow up Care:  Patient encouraged to follow up with the following provider within the specified time frame, or sooner as dictated by the severity of his symptoms. As always, he was instructed that for any urgent/emergent  care needs, he should seek care either here or in the emergency department for more immediate evaluation.   Gertie Baron, DNP, NP-c    Gertie Baron, NP 01/01/20 [phone]

## 2020-01-01 NOTE — Discharge Instructions (Signed)
-  Take an over-the-counter allergy medication such as Zyrtec or Xyzal every day -Take an over-the-counter H2 receptor blocker such as Pepcid or Zantac every day -Cold compresses to the area -Monitor the redness and swelling of the area.  If the redness and swelling does not get better with all the above medications and your prednisone, start your antibiotic as prescribed.

## 2020-01-02 ENCOUNTER — Other Ambulatory Visit: Payer: Self-pay

## 2020-01-02 DIAGNOSIS — F988 Other specified behavioral and emotional disorders with onset usually occurring in childhood and adolescence: Secondary | ICD-10-CM

## 2020-01-02 MED ORDER — AMPHETAMINE-DEXTROAMPHET ER 20 MG PO CP24: 20.0000 mg | ORAL_CAPSULE | Freq: Every day | ORAL | 0 refills | Status: DC

## 2020-02-03 ENCOUNTER — Other Ambulatory Visit: Payer: Self-pay

## 2020-02-03 ENCOUNTER — Ambulatory Visit: Payer: Self-pay

## 2020-02-03 DIAGNOSIS — E291 Testicular hypofunction: Secondary | ICD-10-CM

## 2020-02-03 MED ORDER — TESTOSTERONE CYPIONATE 200 MG/ML IM SOLN
100.0000 mg | INTRAMUSCULAR | Status: AC
  Administered 2020-02-03 – 2020-07-19 (×7): 100 mg via INTRAMUSCULAR

## 2020-02-16 ENCOUNTER — Other Ambulatory Visit: Payer: Self-pay

## 2020-02-16 DIAGNOSIS — F988 Other specified behavioral and emotional disorders with onset usually occurring in childhood and adolescence: Secondary | ICD-10-CM

## 2020-02-16 MED ORDER — AMPHETAMINE-DEXTROAMPHET ER 20 MG PO CP24: 20.0000 mg | ORAL_CAPSULE | Freq: Every day | ORAL | 0 refills | Status: DC

## 2020-02-23 ENCOUNTER — Other Ambulatory Visit: Payer: Self-pay

## 2020-02-23 ENCOUNTER — Ambulatory Visit: Payer: Self-pay

## 2020-02-23 DIAGNOSIS — E291 Testicular hypofunction: Secondary | ICD-10-CM

## 2020-02-28 ENCOUNTER — Encounter: Payer: Self-pay | Admitting: Emergency Medicine

## 2020-02-28 ENCOUNTER — Ambulatory Visit: Payer: Self-pay | Admitting: Emergency Medicine

## 2020-02-28 ENCOUNTER — Other Ambulatory Visit: Payer: Self-pay

## 2020-02-28 VITALS — BP 132/73 | HR 79 | Resp 14 | Ht 73.0 in | Wt 325.0 lb

## 2020-02-28 DIAGNOSIS — R319 Hematuria, unspecified: Secondary | ICD-10-CM

## 2020-02-28 LAB — POCT URINALYSIS DIPSTICK
Bilirubin, UA: NEGATIVE
Blood, UA: NEGATIVE
Glucose, UA: NEGATIVE
Ketones, UA: NEGATIVE
Nitrite, UA: NEGATIVE
Protein, UA: NEGATIVE
Spec Grav, UA: 1.025 (ref 1.010–1.025)
Urobilinogen, UA: 0.2 E.U./dL
pH, UA: 6 (ref 5.0–8.0)

## 2020-02-28 MED ORDER — PREDNISONE 10 MG (21) PO TBPK: ORAL_TABLET | ORAL | 0 refills | Status: DC

## 2020-02-28 NOTE — Progress Notes (Signed)
Pt presents with left lower abd/testical pain  That has been off and on for about a year and needs a 2nd opinion. Pt does have Hx of UTI. CL,RMA

## 2020-02-28 NOTE — Progress Notes (Signed)
Occupational Health Provider Note       Time seen: 9:43 AM    I have reviewed the vital signs and the nursing notes.  HISTORY   Chief Complaint Abdominal Pain    HPI Jeffrey Carlson is a 49 y.o. male with a history of arthritis, degenerative joint disease, GERD, hypertension, obesity, sleep apnea, undescended right testicle who presents today for pain that radiates into his left testicle.  This is been occurring on and off for some time, perhaps years.  He is also had Proteus UTI in the past.  He denies fevers, chills or other complaints.  Past Medical History:  Diagnosis Date  . Adult ADHD   . Arthritis   . Barrett esophagus   . Colon polyps   . Degenerative joint disease   . GERD (gastroesophageal reflux disease)   . Hiatal hernia   . Hypertension    dr Burt Ek   [place]  . Leukocytosis   . Obesity   . Polycythemia, secondary 12/20/2014  . Sleep apnea    cpap  . Testosterone deficiency   . Undescended right testicle     Past Surgical History:  Procedure Laterality Date  . APPENDECTOMY    . BACK SURGERY    . CHOLECYSTECTOMY    . COLONOSCOPY  06/2010   + small bowel capsule study  . EYE SURGERY     childhood  . KNEE SURGERY    . ROTATOR CUFF REPAIR     rt shoulder    Allergies Patient has no known allergies.   Review of Systems Constitutional: Negative for fever. Cardiovascular: Negative for chest pain. Respiratory: Negative for shortness of breath. Gastrointestinal: Negative for abdominal pain, vomiting and diarrhea. Genitourinary: Positive for left-sided testicular pain Musculoskeletal: Negative for back pain. Skin: Negative for rash. Neurological: Negative for headaches, focal weakness or numbness.  All systems negative/normal/unremarkable except as stated in the HPI  ____________________________________________   PHYSICAL EXAM:  VITAL SIGNS: Vitals:   02/28/20 0928  BP: 132/73  Pulse: 79  Resp: 14  SpO2: 100%     Constitutional: Alert and oriented. Well appearing and in no distress. Eyes: Conjunctivae are normal. Normal extraocular movements. Gastrointestinal: Soft and nontender. Normal bowel sounds Genitourinary: Hypogonadism, no inguinal hernias are palpated on either side Musculoskeletal: Nontender with normal range of motion in extremities. No lower extremity tenderness nor edema. Neurologic:  Normal speech and language. No gross focal neurologic deficits are appreciated.  Skin:  Skin is warm, dry and intact. No rash noted. Psychiatric: Speech and behavior are normal.   ____________________________________________   LABS (pertinent positives/negatives)  Recent Results (from the past 2160 hour(s))  POCT urinalysis dipstick     Status: Abnormal   Collection Time: 02/28/20  9:46 AM  Result Value Ref Range   Color, UA yellow    Clarity, UA clear    Glucose, UA Negative Negative   Bilirubin, UA negative    Ketones, UA negative    Spec Grav, UA 1.025 1.010 - 1.025   Blood, UA negative    pH, UA 6.0 5.0 - 8.0   Protein, UA Negative Negative   Urobilinogen, UA 0.2 0.2 or 1.0 E.U./dL   Nitrite, UA negative    Leukocytes, UA Small (1+) (A) Negative   Appearance dark    Odor       ASSESSMENT AND PLAN  Left-sided inguinal pain   Plan: The patient had presented for left-sided inguinal pain. Patient's labs were grossly unremarkable.  He did have small leukocytes  in his urine but no symptoms.  He has left-sided inguinal pain which I think is referred from his back.  I will place him on a steroid taper and we will consider MRI of his lumbar spine should his symptoms persist.  This is mostly a chronic problem.  Lenise Arena MD    Note: This note was generated in part or whole with voice recognition software. Voice recognition is usually quite accurate but there are transcription errors that can and very often do occur. I apologize for any typographical errors that were not detected  and corrected.

## 2020-03-20 DIAGNOSIS — Z20822 Contact with and (suspected) exposure to covid-19: Secondary | ICD-10-CM | POA: Diagnosis not present

## 2020-03-21 ENCOUNTER — Other Ambulatory Visit: Payer: Self-pay

## 2020-03-21 DIAGNOSIS — M545 Low back pain, unspecified: Secondary | ICD-10-CM

## 2020-03-21 MED ORDER — MELOXICAM 15 MG PO TABS: 15.0000 mg | ORAL_TABLET | Freq: Every day | ORAL | 2 refills | Status: DC

## 2020-03-28 ENCOUNTER — Other Ambulatory Visit: Payer: Self-pay

## 2020-03-28 ENCOUNTER — Ambulatory Visit: Payer: Self-pay

## 2020-03-28 DIAGNOSIS — E291 Testicular hypofunction: Secondary | ICD-10-CM

## 2020-03-28 DIAGNOSIS — F988 Other specified behavioral and emotional disorders with onset usually occurring in childhood and adolescence: Secondary | ICD-10-CM

## 2020-03-28 MED ORDER — AMPHETAMINE-DEXTROAMPHET ER 20 MG PO CP24: 20.0000 mg | ORAL_CAPSULE | Freq: Every day | ORAL | 0 refills | Status: DC

## 2020-03-28 NOTE — Progress Notes (Signed)
Pt presented today for testosterone injection. Pt is scheduled to have his T levels checked 04/04/2020.

## 2020-04-04 ENCOUNTER — Ambulatory Visit: Payer: 59 | Admitting: Physician Assistant

## 2020-04-16 ENCOUNTER — Other Ambulatory Visit: Payer: Self-pay

## 2020-04-16 ENCOUNTER — Ambulatory Visit: Payer: Self-pay

## 2020-04-16 DIAGNOSIS — E291 Testicular hypofunction: Secondary | ICD-10-CM

## 2020-04-16 NOTE — Progress Notes (Signed)
Pt presented today for testosterone injection./CL,RMA

## 2020-05-04 ENCOUNTER — Ambulatory Visit: Payer: Self-pay | Admitting: Physician Assistant

## 2020-05-04 ENCOUNTER — Other Ambulatory Visit: Payer: Self-pay

## 2020-05-04 ENCOUNTER — Encounter: Payer: Self-pay | Admitting: Physician Assistant

## 2020-05-04 VITALS — BP 141/89 | HR 82 | Temp 97.9°F | Resp 12

## 2020-05-04 DIAGNOSIS — Z23 Encounter for immunization: Secondary | ICD-10-CM

## 2020-05-04 DIAGNOSIS — F988 Other specified behavioral and emotional disorders with onset usually occurring in childhood and adolescence: Secondary | ICD-10-CM

## 2020-05-04 MED ORDER — AMPHETAMINE-DEXTROAMPHET ER 20 MG PO CP24: 20.0000 mg | ORAL_CAPSULE | Freq: Every day | ORAL | 0 refills | Status: DC

## 2020-05-04 NOTE — Progress Notes (Signed)
   Subjective: Medication refill for ADHD    Patient ID: Jeffrey Carlson, male    DOB: 1971-03-20, 49 y.o.   MRN: [id number]  HPI Patient presents for refill of Adderall.  Patient reports no concerns or complaints with medication.   Review of Systems    ADHD, GERD, and hypogonadism Objective:   Physical Exam No acute distress.  HEENT is unremarkable.  Neck is supple for adenopathy or bruits.  Lungs clear to auscultation.  Heart regular rate and rhythm.       Assessment & Plan: ADHD  Patient prescription for Adderall 20 mg will be refilled.  Advised to follow-up with as scheduled.

## 2020-05-11 ENCOUNTER — Other Ambulatory Visit: Payer: Self-pay

## 2020-05-11 DIAGNOSIS — E291 Testicular hypofunction: Secondary | ICD-10-CM

## 2020-05-11 NOTE — Addendum Note (Signed)
Addended by: Aliene Altes on: 05/11/2020 08:33 AM   Modules accepted: Orders

## 2020-05-11 NOTE — Progress Notes (Signed)
T level collected by Elna Breslow without difficulty.

## 2020-05-11 NOTE — Addendum Note (Signed)
Addended by: Darletta Moll on: 05/11/2020 08:17 AM   Modules accepted: Orders

## 2020-05-13 ENCOUNTER — Other Ambulatory Visit: Payer: Self-pay | Admitting: Physician Assistant

## 2020-05-13 DIAGNOSIS — E291 Testicular hypofunction: Secondary | ICD-10-CM

## 2020-05-13 LAB — TESTOSTERONE,FREE AND TOTAL
Testosterone, Free: 6.5 pg/mL — ABNORMAL LOW (ref 7.2–24.0)
Testosterone: 387 ng/dL (ref 264–916)

## 2020-05-25 ENCOUNTER — Ambulatory Visit: Payer: Self-pay

## 2020-05-25 ENCOUNTER — Other Ambulatory Visit: Payer: Self-pay

## 2020-05-25 DIAGNOSIS — E291 Testicular hypofunction: Secondary | ICD-10-CM

## 2020-05-25 NOTE — Progress Notes (Signed)
t-inj given.

## 2020-06-12 ENCOUNTER — Other Ambulatory Visit: Payer: Self-pay | Admitting: Physician Assistant

## 2020-06-12 DIAGNOSIS — M545 Low back pain, unspecified: Secondary | ICD-10-CM

## 2020-06-14 ENCOUNTER — Other Ambulatory Visit: Payer: Self-pay

## 2020-06-14 DIAGNOSIS — F988 Other specified behavioral and emotional disorders with onset usually occurring in childhood and adolescence: Secondary | ICD-10-CM

## 2020-06-14 MED ORDER — AMPHETAMINE-DEXTROAMPHET ER 20 MG PO CP24: 20.0000 mg | ORAL_CAPSULE | Freq: Every day | ORAL | 0 refills | Status: DC

## 2020-06-28 DIAGNOSIS — M1711 Unilateral primary osteoarthritis, right knee: Secondary | ICD-10-CM | POA: Diagnosis not present

## 2020-06-29 ENCOUNTER — Other Ambulatory Visit: Payer: Self-pay

## 2020-06-29 ENCOUNTER — Ambulatory Visit (INDEPENDENT_AMBULATORY_CARE_PROVIDER_SITE_OTHER): Payer: 59 | Admitting: Urology

## 2020-06-29 ENCOUNTER — Encounter: Payer: Self-pay | Admitting: Urology

## 2020-06-29 VITALS — BP 135/88 | HR 96 | Ht 73.0 in | Wt 365.0 lb

## 2020-06-29 DIAGNOSIS — R3915 Urgency of urination: Secondary | ICD-10-CM

## 2020-06-29 DIAGNOSIS — N410 Acute prostatitis: Secondary | ICD-10-CM | POA: Diagnosis not present

## 2020-06-29 LAB — URINALYSIS, COMPLETE
Bilirubin, UA: NEGATIVE
Glucose, UA: NEGATIVE
Ketones, UA: NEGATIVE
Leukocytes,UA: NEGATIVE
Nitrite, UA: NEGATIVE
Protein,UA: NEGATIVE
RBC, UA: NEGATIVE
Specific Gravity, UA: 1.02 (ref 1.005–1.030)
Urobilinogen, Ur: 0.2 mg/dL (ref 0.2–1.0)
pH, UA: 6 (ref 5.0–7.5)

## 2020-06-29 LAB — MICROSCOPIC EXAMINATION
Bacteria, UA: NONE SEEN
WBC, UA: NONE SEEN /hpf (ref 0–5)

## 2020-06-29 MED ORDER — TAMSULOSIN HCL 0.4 MG PO CAPS: 0.4000 mg | ORAL_CAPSULE | Freq: Every day | ORAL | 1 refills | Status: DC

## 2020-06-29 NOTE — Progress Notes (Signed)
06/29/2020 1:40 PM   HEZAKIAH CHAMPEAU Nov 24, 1970 [id number]  Referring provider: No referring provider defined for this encounter.  Chief Complaint  Patient presents with  . Prostatitis    HPI: 49 y.o. male presents for annual follow-up.   Episode acute prostatitis October 2020  No recurrent infections  Over the last few months has noted increased urinary urgency and postvoid dribbling  Had taken tamsulosin in the past  On TRT which is administered and monitor by [facility]   PMH: Past Medical History:  Diagnosis Date  . Adult ADHD   . Arthritis   . Barrett esophagus   . Colon polyps   . Degenerative joint disease   . GERD (gastroesophageal reflux disease)   . Hiatal hernia   . Hypertension    dr Burt Ek   [place]  . Leukocytosis   . Obesity   . Polycythemia, secondary 12/20/2014  . Sleep apnea    cpap  . Testosterone deficiency   . Undescended right testicle     Surgical History: Past Surgical History:  Procedure Laterality Date  . APPENDECTOMY    . BACK SURGERY    . CHOLECYSTECTOMY    . COLONOSCOPY  06/2010   + small bowel capsule study  . EYE SURGERY     childhood  . KNEE SURGERY    . ROTATOR CUFF REPAIR     rt shoulder    Home Medications:  Allergies as of 06/29/2020   No Known Allergies     Medication List       Accurate as of June 29, 2020  1:40 PM. If you have any questions, ask your nurse or doctor.        amphetamine-dextroamphetamine 20 MG 24 hr capsule Commonly known as: Adderall XR Take 1 capsule (20 mg total) by mouth daily.   clotrimazole-betamethasone cream Commonly known as: Lotrisone Apply 1 application topically 2 (two) times daily.   esomeprazole 40 MG capsule Commonly known as: NEXIUM Take 40 mg by mouth daily at 12 noon.   lisinopril 20 MG tablet Commonly known as: ZESTRIL TAKE 1 TABLET BY MOUTH EVERY DAY   meloxicam 15 MG tablet Commonly known as: MOBIC Take 1 tablet  (15 mg total) by mouth daily.   methylPREDNISolone 4 MG Tbpk tablet Commonly known as: MEDROL DOSEPAK Take by mouth.   tamsulosin 0.4 MG Caps capsule Commonly known as: FLOMAX TAKE 1 CAPSULE BY MOUTH EVERY DAY   Testosterone Cypionate 200 MG/ML Soln Inject 100 mg as directed every 14 (fourteen) days.   testosterone cypionate 200 MG/ML injection Commonly known as: DEPOTESTOSTERONE CYPIONATE Inject 200 mLs into the muscle.       Allergies: No Known Allergies  Family History: Family History  Problem Relation Age of Onset  . Thyroid cancer Father   . Rheum arthritis Mother   . Abnormal EKG Mother   . Colon cancer Paternal Grandmother     Social History:  reports that he has never smoked. He has quit using smokeless tobacco.  His smokeless tobacco use included chew. He reports current alcohol use. He reports that he does not use drugs.   Physical Exam: BP 135/88   Pulse 96   Ht 6\' 1"  (1.854 m)   Wt (!) 365 lb (165.6 kg)   BMI 48.16 kg/m   Constitutional:  Alert and oriented, No acute distress. HEENT: [place] AT, moist mucus membranes.  Trachea midline, no masses. Cardiovascular: No clubbing, cyanosis, or edema. Respiratory: Normal respiratory effort, no  increased work of breathing. Psychiatric: Normal mood and affect.   Assessment & Plan:    1.  History acute prostatitis  No recurrent infections  2.  Lower urinary tract symptoms  Bothersome urgency and postvoid dribbling  Most likely secondary to outlet obstruction  Urinalysis ordered  Trial tamsulosin 0.4 mg daily   Call back 1 month regarding efficacy  Continue annual follow-up   Abbie Sons, MD  [facility] [address] [place], [place] [address] [phone]

## 2020-07-08 ENCOUNTER — Emergency Department: Payer: 59

## 2020-07-08 ENCOUNTER — Encounter: Payer: Self-pay | Admitting: Emergency Medicine

## 2020-07-08 ENCOUNTER — Emergency Department
Admission: EM | Admit: 2020-07-08 | Discharge: 2020-07-08 | Disposition: A | Discharge status: Home / Self Care | Payer: 59 | Attending: Emergency Medicine | Admitting: Emergency Medicine

## 2020-07-08 ENCOUNTER — Other Ambulatory Visit: Payer: Self-pay

## 2020-07-08 DIAGNOSIS — R300 Dysuria: Secondary | ICD-10-CM | POA: Diagnosis not present

## 2020-07-08 DIAGNOSIS — N200 Calculus of kidney: Secondary | ICD-10-CM | POA: Diagnosis not present

## 2020-07-08 DIAGNOSIS — N390 Urinary tract infection, site not specified: Secondary | ICD-10-CM

## 2020-07-08 DIAGNOSIS — R310 Gross hematuria: Secondary | ICD-10-CM | POA: Insufficient documentation

## 2020-07-08 DIAGNOSIS — N2881 Hypertrophy of kidney: Secondary | ICD-10-CM | POA: Diagnosis not present

## 2020-07-08 DIAGNOSIS — Z79899 Other long term (current) drug therapy: Secondary | ICD-10-CM | POA: Insufficient documentation

## 2020-07-08 DIAGNOSIS — I10 Essential (primary) hypertension: Secondary | ICD-10-CM | POA: Diagnosis not present

## 2020-07-08 DIAGNOSIS — R319 Hematuria, unspecified: Secondary | ICD-10-CM | POA: Diagnosis present

## 2020-07-08 DIAGNOSIS — M47816 Spondylosis without myelopathy or radiculopathy, lumbar region: Secondary | ICD-10-CM | POA: Diagnosis not present

## 2020-07-08 DIAGNOSIS — K5730 Diverticulosis of large intestine without perforation or abscess without bleeding: Secondary | ICD-10-CM | POA: Diagnosis not present

## 2020-07-08 LAB — URINALYSIS, COMPLETE (UACMP) WITH MICROSCOPIC
Bilirubin Urine: NEGATIVE
Glucose, UA: NEGATIVE mg/dL
Ketones, ur: NEGATIVE mg/dL
Nitrite: NEGATIVE
Protein, ur: 100 mg/dL — AB
RBC / HPF: >50 RBC/hpf — ABNORMAL HIGH (ref 0–5)
Specific Gravity, Urine: 1.021 (ref 1.005–1.030)
WBC, UA: >50 WBC/hpf — ABNORMAL HIGH (ref 0–5)
pH: 6 (ref 5.0–8.0)

## 2020-07-08 LAB — BASIC METABOLIC PANEL
Anion gap: 7 (ref 5–15)
BUN: 20 mg/dL (ref 6–20)
CO2: 25 mmol/L (ref 22–32)
Calcium: 9 mg/dL (ref 8.9–10.3)
Chloride: 106 mmol/L (ref 98–111)
Creatinine, Ser: 1.13 mg/dL (ref 0.61–1.24)
GFR, Estimated: >60 mL/min (ref >60)
Glucose, Bld: 110 mg/dL — ABNORMAL HIGH (ref 70–99)
Potassium: 3.9 mmol/L (ref 3.5–5.1)
Sodium: 138 mmol/L (ref 135–145)

## 2020-07-08 LAB — CBC
HCT: 48.5 % (ref 39.0–52.0)
Hemoglobin: 15.9 g/dL (ref 13.0–17.0)
MCH: 27.5 pg (ref 26.0–34.0)
MCHC: 32.8 g/dL (ref 30.0–36.0)
MCV: 83.9 fL (ref 80.0–100.0)
Platelets: 409 10*3/uL — ABNORMAL HIGH (ref 150–400)
RBC: 5.78 MIL/uL (ref 4.22–5.81)
RDW: 14.8 % (ref 11.5–15.5)
WBC: 16.5 10*3/uL — ABNORMAL HIGH (ref 4.0–10.5)
nRBC: 0 % (ref 0.0–0.2)

## 2020-07-08 MED ORDER — CEPHALEXIN 500 MG PO CAPS: 500.0000 mg | ORAL_CAPSULE | Freq: Four times a day (QID) | ORAL | 0 refills | Status: AC

## 2020-07-08 MED ORDER — CEPHALEXIN 500 MG PO CAPS
500.0000 mg | ORAL_CAPSULE | Freq: Once | ORAL | Status: AC
  Administered 2020-07-08: 500 mg via ORAL
  Filled 2020-07-08: qty 1

## 2020-07-08 NOTE — ED Notes (Signed)
Pt to CT

## 2020-07-08 NOTE — Discharge Instructions (Signed)
As we discussed, you have signs of a urinary tract infection but no kidney stone.  An infection can cause the bleeding that you are seeing, and should get better as the infection is treated.  You are being discharged with a prescription for Keflex antibiotic to take 4 times daily for the next 10 days.  Even if your symptoms are getting better, please finish all 40 pills as directed.  Follow-up with Dr. Bernardo Heater in his clinic.  Continue to take all your other medications, including the Flomax.  If you develop any worsening symptoms despite these medications, please return to the ED.

## 2020-07-08 NOTE — ED Notes (Signed)
Pt to ED with red bleeding and small clots in urine. Hematuria started this morning. Pt has had burning with urination for past 2d.  Pt has hx renal stones (years ago) and recurring UTIs. Pt has been on new regimen of Flomax for past 1 week. Pt denies pain other than burning with urination.

## 2020-07-08 NOTE — ED Provider Notes (Signed)
[facility] Emergency Department Provider Note ____________________________________________   Event Date/Time   First MD Initiated Contact with Patient 07/08/20 1457     (approximate)  I have reviewed the triage vital signs and the nursing notes.  HISTORY  Chief Complaint Hematuria   HPI Jeffrey Carlson is a 49 y.o. Audrie Gallus presents to the ED for evaluation of hematuria.   Chart review indicates pt follows with Dr. Bernardo Heater urology, last seen 2 weeks ago for an episode of prostatitis that occurred in October 2020.  Patient presents to the ED with 2 days of dysuria, hematuria with clots, nausea and left-sided flank/LLQ discomfort.  He denies fevers, severe flank or groin pain, trauma, penile discharge/urethral discharge, emesis, diarrhea.  Reports some nausea without emesis.  He reports feeling nausea to his LLQ or some mild discomfort, but denies severe pain.  No recent antibiotics.   Past Medical History:  Diagnosis Date  . Adult ADHD   . Arthritis   . Barrett esophagus   . Colon polyps   . Degenerative joint disease   . GERD (gastroesophageal reflux disease)   . Hiatal hernia   . Hypertension    dr Burt Ek   [place]  . Leukocytosis   . Obesity   . Polycythemia, secondary 12/20/2014  . Sleep apnea    cpap  . Testosterone deficiency   . Undescended right testicle     Patient Active Problem List   Diagnosis Date Noted  . Hypophosphatemia 09/26/2019  . Iron deficiency anemia 02/15/2019  . Hypogonadism in male 02/15/2019  . Essential hypertension 02/15/2019  . Gastroesophageal reflux disease 02/15/2019  . History of colonic polyps 02/15/2019  . Attention deficit hyperactivity disorder (ADHD) 02/15/2019  . Obesity, Class III, BMI 40-49.9 (morbid obesity) ([place]) 07/19/2018  . Osteoarthritis of knee 09/08/2017  . Atypical chest pain 01/18/2015  . Bundle branch block, right 01/18/2015  . Breath shortness 01/18/2015  . Polycythemia,  secondary 12/20/2014  . Rotator cuff tear 10/10/2013    Past Surgical History:  Procedure Laterality Date  . APPENDECTOMY    . BACK SURGERY    . CHOLECYSTECTOMY    . COLONOSCOPY  06/2010   + small bowel capsule study  . EYE SURGERY     childhood  . KNEE SURGERY    . ROTATOR CUFF REPAIR     rt shoulder    Prior to Admission medications   Medication Sig Start Date End Date Taking? Authorizing Provider  amphetamine-dextroamphetamine (ADDERALL XR) 20 MG 24 hr capsule Take 1 capsule (20 mg total) by mouth daily. 06/14/20   Sable Feil, PA-C  cephALEXin (KEFLEX) 500 MG capsule Take 1 capsule (500 mg total) by mouth 4 (four) times daily for 10 days. 07/08/20 07/18/20  Vladimir Crofts, MD  clotrimazole-betamethasone (LOTRISONE) cream Apply 1 application topically 2 (two) times daily. 06/28/19   Edrick Kins, DPM  esomeprazole (NEXIUM) 40 MG capsule Take 40 mg by mouth daily at 12 noon.    [provider]  lisinopril (ZESTRIL) 20 MG tablet TAKE 1 TABLET BY MOUTH EVERY DAY 12/22/19   Sable Feil, PA-C  meloxicam (MOBIC) 15 MG tablet Take 1 tablet (15 mg total) by mouth daily. 03/21/20   Sable Feil, PA-C  methylPREDNISolone (MEDROL DOSEPAK) 4 MG TBPK tablet Take by mouth. 06/28/20   [provider]  tamsulosin (FLOMAX) 0.4 MG CAPS capsule Take 1 capsule (0.4 mg total) by mouth daily. 06/29/20   Stoioff, Ronda Fairly, MD  testosterone cypionate (  DEPOTESTOSTERONE CYPIONATE) 200 MG/ML injection Inject 200 mLs into the muscle. 02/09/20   [provider]  Testosterone Cypionate 200 MG/ML SOLN Inject 100 mg as directed every 14 (fourteen) days. 11/10/19   Sable Feil, PA-C    Allergies Patient has no known allergies.  Family History  Problem Relation Age of Onset  . Thyroid cancer Father   . Rheum arthritis Mother   . Abnormal EKG Mother   . Colon cancer Paternal Grandmother     Social History Social History   Tobacco Use  . Smoking status: Never Smoker  .  Smokeless tobacco: Former Systems developer    Types: Secondary school teacher  . Vaping Use: Never used  Substance Use Topics  . Alcohol use: Yes    Alcohol/week: 0.0 standard drinks    Comment: occ  . Drug use: No    Review of Systems  Constitutional: No fever/chills Eyes: No visual changes. ENT: No sore throat. Cardiovascular: Denies chest pain. Respiratory: Denies shortness of breath. Gastrointestinal: no vomiting.  No diarrhea.  No constipation. Positive for LLQ abdominal discomfort and nausea Genitourinary: Positive for hematuria and for dysuria. Musculoskeletal: Negative for back pain. Skin: Negative for rash. Neurological: Negative for headaches, focal weakness or numbness.  ____________________________________________   PHYSICAL EXAM:  VITAL SIGNS: Vitals:   07/08/20 1508 07/08/20 1618  BP: (!) 127/117 136/84  Pulse: (!) 105 98  Resp: 15 15  Temp: 98.2 F (36.8 C)   SpO2: 99% 93%     Constitutional: Alert and oriented. Well appearing and in no acute distress.  Obese. Eyes: Conjunctivae are normal. PERRL. EOMI. Head: Atraumatic. Nose: No congestion/rhinnorhea. Mouth/Throat: Mucous membranes are moist.  Oropharynx non-erythematous. Neck: No stridor. No cervical spine tenderness to palpation. Cardiovascular: Normal rate, regular rhythm. Grossly normal heart sounds.  Good peripheral circulation. Respiratory: Normal respiratory effort.  No retractions. Lungs CTAB. Gastrointestinal: Soft , nondistended, nontender to palpation. No CVA tenderness.  Benign abdomen without CVA tenderness. Musculoskeletal: No lower extremity tenderness nor edema.  No joint effusions. No signs of acute trauma. Neurologic:  Normal speech and language. No gross focal neurologic deficits are appreciated. No gait instability noted. Skin:  Skin is warm, dry and intact. No rash noted. Psychiatric: Mood and affect are normal. Speech and behavior are normal.  ____________________________________________    LABS (all labs ordered are listed, but only abnormal results are displayed)  Labs Reviewed  URINALYSIS, COMPLETE (UACMP) WITH MICROSCOPIC - Abnormal; Notable for the following components:      Result Value   Color, Urine RED (*)    APPearance CLOUDY (*)    Hgb urine dipstick LARGE (*)    Protein, ur 100 (*)    Leukocytes,Ua MODERATE (*)    RBC / HPF >50 (*)    WBC, UA >50 (*)    Bacteria, UA RARE (*)    All other components within normal limits  BASIC METABOLIC PANEL - Abnormal; Notable for the following components:   Glucose, Bld 110 (*)    All other components within normal limits  CBC - Abnormal; Notable for the following components:   WBC 16.5 (*)    Platelets 409 (*)    All other components within normal limits  URINE CULTURE   ____________________________________________  RADIOLOGY  ED MD interpretation: CT renal study reviewed by me without evidence of ureterolithiasis or hydronephrosis  Official radiology report(s): CT Renal Stone Study  Result Date: 07/08/2020 CLINICAL DATA:  49 year old male with flank pain. Concern for kidney stone. EXAM:  CT ABDOMEN AND PELVIS WITHOUT CONTRAST TECHNIQUE: Multidetector CT imaging of the abdomen and pelvis was performed following the standard protocol without IV contrast. COMPARISON:  CT abdomen pelvis dated 09/13/2013. FINDINGS: Evaluation of this exam is limited in the absence of intravenous contrast. Lower chest: The visualized lung bases are clear. No intra-abdominal free air or free fluid. Hepatobiliary: Fatty infiltration of the liver. No intrahepatic biliary ductal dilatation. Cholecystectomy. No retained calcified stone noted in the central CBD. Pancreas: Unremarkable. No pancreatic ductal dilatation or surrounding inflammatory changes. Spleen: Normal in size without focal abnormality. Adrenals/Urinary Tract: The adrenal glands unremarkable. There is a punctate nonobstructing left renal interpolar calculus. No hydronephrosis.  There is slight enlargement of the mid to lower pole of the left kidney with overall decreased renal parenchyma at attenuation and minimal perinephric haziness. Findings most consistent with pyelonephritis. Correlation with urinalysis recommended. A faint 13 mm hypodense focus in the inferior pole of the left kidney likely representing a cyst and appears to have been present on the prior CT. Developing abscess is less likely. The right kidney is unremarkable. The visualized ureters and urinary bladder appear unremarkable. Stomach/Bowel: There is sigmoid diverticulosis without active inflammatory changes. There is no bowel obstruction or active inflammation. The appendix is surgically absent. Vascular/Lymphatic: The abdominal aorta and IVC unremarkable. No portal venous gas. There is no adenopathy. Reproductive: The prostate and seminal vesicles are grossly unremarkable. No pelvic mass. Other: None Musculoskeletal: Degenerative changes of the lower lumbar spine. No acute osseous pathology. IMPRESSION: 1. Findings most consistent with left-sided pyelonephritis. Correlation with urinalysis recommended. No definite drainable fluid collection or abscess. 2. Punctate nonobstructing left renal interpolar calculus. No hydronephrosis. 3. Fatty liver. 4. Sigmoid diverticulosis. No bowel obstruction. Electronically Signed   By: Anner Crete M.D.   On: 07/08/2020 15:50    ____________________________________________   PROCEDURES and INTERVENTIONS  Procedure(s) performed (including Critical Care):  .1-3 Lead EKG Interpretation Performed by: Vladimir Crofts, MD Authorized by: Vladimir Crofts, MD     Interpretation: normal     ECG rate:  96   ECG rate assessment: normal     Rhythm: sinus rhythm     Ectopy: none     Conduction: normal      Medications  cephALEXin (KEFLEX) capsule 500 mg (500 mg Oral Given 07/08/20 1620)    ____________________________________________   MDM / ED COURSE   49 year old  male with history of BPH and previous lower urinary tract infectious diseases, presents with dysuria and with evidence of repeat UTI amenable to outpatient management.  Tachycardic in triage that self resolves, vital otherwise normal on room air.  Exam is reassuring with a benign abdomen, no CVA tenderness, no distress or signs of trauma.  Blood work with leukocytosis, but intact renal function.  Due to his history of kidney stones, CT obtained and shows no evidence of ureterolithiasis, obstruction or GI pathology.  Urine with infectious features, and sent for culture.  Based on previous culture results, how well he looks clinically here, I think outpatient management with Keflex is reasonable.  We discussed return precautions for the ED and patient is medically stable for discharge home.   Clinical Course as of 07/08/20 1653  Sun Jul 08, 2020  1507 Anion gap: 7 [DS]  1549 Review of cultures demonstrates most recent positive urine culture, 10/2019 + for Proteus.  Susceptible to first generation cephalosporin [DS]  [id number] Reassessed.  Patient reports feeling well.  We discussed CT results with evidence of urinary tract infectious disease,  some signs of pyelonephritis, but no stones.  Patient reports he is okay with being treated as an outpatient.  We discussed return precautions for the ED .  We discussed following up with his urologist.  Answered questions.  Offered antiemetic, but he declines. [DS]    Clinical Course User Index [DS] Vladimir Crofts, MD    ____________________________________________   FINAL CLINICAL IMPRESSION(S) / ED DIAGNOSES  Final diagnoses:  Lower urinary tract infectious disease  Gross hematuria  Dysuria     ED Discharge Orders         Ordered    cephALEXin (KEFLEX) 500 MG capsule  4 times daily        07/08/20 1558           Journei Thomassen   Note:  This document was prepared using Systems analyst and may include unintentional dictation errors.    Vladimir Crofts, MD 07/08/20 1655

## 2020-07-08 NOTE — ED Triage Notes (Signed)
Pt to ER with c/o burning on urination for last several days.  States noted hematuria with clots today.  Pt has hx of kidney stones and saw Dr. Bernardo Heater on 12/17 and was started on Flomax.  Pt states mild flank and testicle pain.

## 2020-07-10 LAB — URINE CULTURE: Culture: >=100000 — AB

## 2020-07-18 ENCOUNTER — Other Ambulatory Visit: Payer: Self-pay

## 2020-07-18 ENCOUNTER — Ambulatory Visit (INDEPENDENT_AMBULATORY_CARE_PROVIDER_SITE_OTHER): Payer: 59 | Admitting: Urology

## 2020-07-18 ENCOUNTER — Encounter: Payer: Self-pay | Admitting: Urology

## 2020-07-18 VITALS — BP 144/85 | HR 97 | Ht 73.0 in | Wt 325.0 lb

## 2020-07-18 DIAGNOSIS — N401 Enlarged prostate with lower urinary tract symptoms: Secondary | ICD-10-CM | POA: Diagnosis not present

## 2020-07-18 DIAGNOSIS — N410 Acute prostatitis: Secondary | ICD-10-CM | POA: Diagnosis not present

## 2020-07-18 DIAGNOSIS — R3915 Urgency of urination: Secondary | ICD-10-CM

## 2020-07-18 DIAGNOSIS — R310 Gross hematuria: Secondary | ICD-10-CM | POA: Diagnosis not present

## 2020-07-18 DIAGNOSIS — N411 Chronic prostatitis: Secondary | ICD-10-CM

## 2020-07-19 ENCOUNTER — Encounter: Payer: Self-pay | Admitting: Urology

## 2020-07-19 ENCOUNTER — Other Ambulatory Visit: Payer: Self-pay

## 2020-07-19 ENCOUNTER — Ambulatory Visit: Payer: Self-pay

## 2020-07-19 DIAGNOSIS — F988 Other specified behavioral and emotional disorders with onset usually occurring in childhood and adolescence: Secondary | ICD-10-CM

## 2020-07-19 DIAGNOSIS — M25561 Pain in right knee: Secondary | ICD-10-CM | POA: Diagnosis not present

## 2020-07-19 DIAGNOSIS — Z7689 Persons encountering health services in other specified circumstances: Secondary | ICD-10-CM

## 2020-07-19 DIAGNOSIS — E291 Testicular hypofunction: Secondary | ICD-10-CM

## 2020-07-19 DIAGNOSIS — M222X1 Patellofemoral disorders, right knee: Secondary | ICD-10-CM | POA: Diagnosis not present

## 2020-07-19 LAB — URINALYSIS, COMPLETE
Bilirubin, UA: NEGATIVE
Glucose, UA: NEGATIVE
Ketones, UA: NEGATIVE
Leukocytes,UA: NEGATIVE
Nitrite, UA: NEGATIVE
Protein,UA: NEGATIVE
RBC, UA: NEGATIVE
Specific Gravity, UA: 1.01 (ref 1.005–1.030)
Urobilinogen, Ur: 0.2 mg/dL (ref 0.2–1.0)
pH, UA: 7 (ref 5.0–7.5)

## 2020-07-19 LAB — MICROSCOPIC EXAMINATION: Bacteria, UA: NONE SEEN

## 2020-07-19 MED ORDER — TAMSULOSIN HCL 0.4 MG PO CAPS: 0.4000 mg | ORAL_CAPSULE | Freq: Every day | ORAL | 3 refills | Status: DC

## 2020-07-19 MED ORDER — AMPHETAMINE-DEXTROAMPHET ER 20 MG PO CP24: 20.0000 mg | ORAL_CAPSULE | Freq: Every day | ORAL | 0 refills | Status: DC

## 2020-07-19 NOTE — Addendum Note (Signed)
Addended by: Riki Altes on: 07/19/2020 10:47 AM   Modules accepted: Orders, Level of Service

## 2020-07-19 NOTE — Progress Notes (Addendum)
07/18/2020 10:26 AM   Charm Rings 12/30/1970 [id number]  Referring provider: No referring provider defined for this encounter.  Chief Complaint  Patient presents with  . Prostatitis    HPI: 50 y.o. male presents for follow-up.   Episode acute prostatitis October 2020  Was seen for annual follow-up 06/2020 and had a several month history of urgency and postvoid dribbling  Started on tamsulosin and has seen improvement in his symptoms and desires to continue  Presented to the ED on 07/08/2020 with a 2-day history of dysuria, gross hematuria, nausea and left flank/LLQ discomfort  No fever or chills  CT scan was performed which was felt to show changes consistent with left pyelonephritis though this was a noncontrast scan  Urinalysis showed >50 RBC/>50 WBC and culture + Proteus mirabilis  Treated with a 10-day course of Keflex 4 times daily  Resolution of symptoms and hematuria   PMH: Past Medical History:  Diagnosis Date  . Adult ADHD   . Arthritis   . Barrett esophagus   . Colon polyps   . Degenerative joint disease   . GERD (gastroesophageal reflux disease)   . Hiatal hernia   . Hypertension    dr Dorothey Baseman   [place]  . Leukocytosis   . Obesity   . Polycythemia, secondary 12/20/2014  . Sleep apnea    cpap  . Testosterone deficiency   . Undescended right testicle     Surgical History: Past Surgical History:  Procedure Laterality Date  . APPENDECTOMY    . BACK SURGERY    . CHOLECYSTECTOMY    . COLONOSCOPY  06/2010   + small bowel capsule study  . EYE SURGERY     childhood  . KNEE SURGERY    . ROTATOR CUFF REPAIR     rt shoulder    Home Medications:  Allergies as of 07/18/2020   No Known Allergies     Medication List       Accurate as of July 18, 2020 11:59 PM. If you have any questions, ask your nurse or doctor.        amphetamine-dextroamphetamine 20 MG 24 hr capsule Commonly known as: Adderall XR Take 1 capsule (20 mg  total) by mouth daily.   cephALEXin 500 MG capsule Commonly known as: KEFLEX Take 1 capsule (500 mg total) by mouth 4 (four) times daily for 10 days.   clotrimazole-betamethasone cream Commonly known as: Lotrisone Apply 1 application topically 2 (two) times daily.   esomeprazole 40 MG capsule Commonly known as: NEXIUM Take 40 mg by mouth daily at 12 noon.   lisinopril 20 MG tablet Commonly known as: ZESTRIL TAKE 1 TABLET BY MOUTH EVERY DAY   meloxicam 15 MG tablet Commonly known as: MOBIC Take 1 tablet (15 mg total) by mouth daily.   methylPREDNISolone 4 MG Tbpk tablet Commonly known as: MEDROL DOSEPAK Take by mouth.   tamsulosin 0.4 MG Caps capsule Commonly known as: FLOMAX Take 1 capsule (0.4 mg total) by mouth daily.   Testosterone Cypionate 200 MG/ML Soln Inject 100 mg as directed every 14 (fourteen) days.   testosterone cypionate 200 MG/ML injection Commonly known as: DEPOTESTOSTERONE CYPIONATE Inject 200 mLs into the muscle.       Allergies: No Known Allergies  Family History: Family History  Problem Relation Age of Onset  . Thyroid cancer Father   . Rheum arthritis Mother   . Abnormal EKG Mother   . Colon cancer Paternal Grandmother     Social History:  reports that he has never smoked. He has quit using smokeless tobacco.  His smokeless tobacco use included chew. He reports current alcohol use. He reports that he does not use drugs.   Physical Exam: BP (!) 144/85   Pulse 97   Ht 6\' 1"  (1.854 m)   Wt (!) 325 lb (147.4 kg)   BMI 42.88 kg/m   Constitutional:  Alert and oriented, No acute distress. HEENT: [place] AT, moist mucus membranes.  Trachea midline, no masses. Cardiovascular: No clubbing, cyanosis, or edema. Respiratory: Normal respiratory effort, no increased work of breathing.   Pertinent Imaging: Images were personally reviewed and interpreted  CT Renal Stone Study  Narrative CLINICAL DATA:  50 year old male with flank pain. Concern  for kidney stone.  EXAM: CT ABDOMEN AND PELVIS WITHOUT CONTRAST  TECHNIQUE: Multidetector CT imaging of the abdomen and pelvis was performed following the standard protocol without IV contrast.  COMPARISON:  CT abdomen pelvis dated 09/13/2013.  FINDINGS: Evaluation of this exam is limited in the absence of intravenous contrast.  Lower chest: The visualized lung bases are clear.  No intra-abdominal free air or free fluid.  Hepatobiliary: Fatty infiltration of the liver. No intrahepatic biliary ductal dilatation. Cholecystectomy. No retained calcified stone noted in the central CBD.  Pancreas: Unremarkable. No pancreatic ductal dilatation or surrounding inflammatory changes.  Spleen: Normal in size without focal abnormality.  Adrenals/Urinary Tract: The adrenal glands unremarkable. There is a punctate nonobstructing left renal interpolar calculus. No hydronephrosis. There is slight enlargement of the mid to lower pole of the left kidney with overall decreased renal parenchyma at attenuation and minimal perinephric haziness. Findings most consistent with pyelonephritis. Correlation with urinalysis recommended. A faint 13 mm hypodense focus in the inferior pole of the left kidney likely representing a cyst and appears to have been present on the prior CT. Developing abscess is less likely.  The right kidney is unremarkable. The visualized ureters and urinary bladder appear unremarkable.  Stomach/Bowel: There is sigmoid diverticulosis without active inflammatory changes. There is no bowel obstruction or active inflammation. The appendix is surgically absent.  Vascular/Lymphatic: The abdominal aorta and IVC unremarkable. No portal venous gas. There is no adenopathy.  Reproductive: The prostate and seminal vesicles are grossly unremarkable. No pelvic mass.  Other: None  Musculoskeletal: Degenerative changes of the lower lumbar spine. No acute osseous  pathology.  IMPRESSION: 1. Findings most consistent with left-sided pyelonephritis. Correlation with urinalysis recommended. No definite drainable fluid collection or abscess. 2. Punctate nonobstructing left renal interpolar calculus. No hydronephrosis. 3. Fatty liver. 4. Sigmoid diverticulosis. No bowel obstruction.   Electronically Signed By: Anner Crete M.D. On: 07/08/2020 15:50   Assessment & Plan:    50 y.o. male with recent episode of gross hematuria, dysuria and urine culture positive for Proteus.  He had minimal left flank discomfort and radiology felt CT was consistent with pyelonephritis.  He has a prior history of prostatitis and had pelvic symptoms at that time.  Feel the etiology would be more in line with prostatitis than pyelonephritis.  His symptoms have resolved.  Recommended repeat UA today and if persistent microhematuria cystoscopy.  He will be notified with the results and further recommendations  Improved LUTS on tamsulosin which was refilled   Abbie Sons, [place] [address] [place], [place] [address] [phone]

## 2020-07-26 ENCOUNTER — Telehealth: Payer: Self-pay

## 2020-07-26 NOTE — Telephone Encounter (Signed)
Elta Guadeloupe called requesting an antibiotic for sinus infection. Phone number [phone]  S/Sx started Tuesday night (07/24/20) - head congestion, facial pain that moves behind left ear; throat raw from sinus drainage & yellowish/brown nasal drainage. States he's had a sinus headache for weeks Denies cough or any difficulty breathing  Home covid test yesterday - Negative PCR covid test at [facility] yesterday - results pending  States "even if I have covid, I know I still have a sinus infection".  AMD

## 2020-07-26 NOTE — Telephone Encounter (Signed)
Dr. Cecil Cobbs contacted [facility] by phone. Left note that Elta Guadeloupe will use a Netty Pot for the next 24 hours. Mark to call clinic back if no better. Awaiting PCR Covid test results - specimen collected yesterday at [facility].  AMD

## 2020-08-03 ENCOUNTER — Other Ambulatory Visit: Payer: Self-pay

## 2020-08-03 ENCOUNTER — Ambulatory Visit: Payer: Self-pay

## 2020-08-03 DIAGNOSIS — M25561 Pain in right knee: Secondary | ICD-10-CM | POA: Diagnosis not present

## 2020-08-03 DIAGNOSIS — E291 Testicular hypofunction: Secondary | ICD-10-CM

## 2020-08-03 DIAGNOSIS — M222X1 Patellofemoral disorders, right knee: Secondary | ICD-10-CM | POA: Diagnosis not present

## 2020-08-03 MED ORDER — TESTOSTERONE CYPIONATE 200 MG/ML IM SOLN
100.0000 mg | INTRAMUSCULAR | Status: AC
  Administered 2020-08-03 – 2020-12-27 (×5): 100 mg via INTRAMUSCULAR

## 2020-08-03 MED ORDER — TESTOSTERONE CYPIONATE 200 MG/ML IM SOLN
200.0000 mg | INTRAMUSCULAR | Status: DC
  Administered 2020-08-03: 200 mg via INTRAMUSCULAR

## 2020-08-03 NOTE — Addendum Note (Signed)
Addended by: Malachy Moan F on: 08/03/2020 04:21 PM   Modules accepted: Orders

## 2020-08-10 ENCOUNTER — Other Ambulatory Visit: Payer: Self-pay

## 2020-08-10 ENCOUNTER — Encounter: Payer: Self-pay | Admitting: Podiatry

## 2020-08-10 ENCOUNTER — Ambulatory Visit (INDEPENDENT_AMBULATORY_CARE_PROVIDER_SITE_OTHER): Payer: 59 | Admitting: Podiatry

## 2020-08-10 ENCOUNTER — Ambulatory Visit (INDEPENDENT_AMBULATORY_CARE_PROVIDER_SITE_OTHER): Payer: 59

## 2020-08-10 DIAGNOSIS — B353 Tinea pedis: Secondary | ICD-10-CM

## 2020-08-10 DIAGNOSIS — M25561 Pain in right knee: Secondary | ICD-10-CM | POA: Diagnosis not present

## 2020-08-10 DIAGNOSIS — M722 Plantar fascial fibromatosis: Secondary | ICD-10-CM

## 2020-08-10 DIAGNOSIS — M222X1 Patellofemoral disorders, right knee: Secondary | ICD-10-CM | POA: Diagnosis not present

## 2020-08-10 MED ORDER — CLOTRIMAZOLE-BETAMETHASONE 1-0.05 % EX CREA: 1.0000 "application " | TOPICAL_CREAM | Freq: Two times a day (BID) | CUTANEOUS | 1 refills | Status: DC

## 2020-08-10 MED ORDER — TERBINAFINE HCL 250 MG PO TABS: 250.0000 mg | ORAL_TABLET | Freq: Every day | ORAL | 0 refills | Status: DC

## 2020-08-10 MED ORDER — BETAMETHASONE SOD PHOS & ACET 6 (3-3) MG/ML IJ SUSP
3.0000 mg | Freq: Once | INTRAMUSCULAR | Status: AC
  Administered 2020-08-10: 3 mg via INTRA_ARTICULAR

## 2020-08-10 NOTE — Progress Notes (Signed)
   Subjective: 50 y.o. male presenting today for 2 new complaints.  Patient states that approximately 3 weeks ago he changed his shoe routine and developed left heel pain.  Patient normally wears Keen shoes and power step insoles.  When he changed/modified his shoe gear while at home he developed left heel pain.  He states over the past week and a half it has slightly improved.  He continues to have pain and tenderness throughout the day and especially at night when he gets up to go to the bathroom from the sleeping position Finally the patient also states that he has had redness along the weightbearing surfaces of his feet with dry itchy skin for several months.  He believes this is athlete's foot.  He has tried OTC antifungals with minimal improvement.   Past Medical History:  Diagnosis Date  . Adult ADHD   . Arthritis   . Barrett esophagus   . Colon polyps   . Degenerative joint disease   . GERD (gastroesophageal reflux disease)   . Hiatal hernia   . Hypertension    dr Burt Ek   [place]  . Leukocytosis   . Obesity   . Polycythemia, secondary 12/20/2014  . Sleep apnea    cpap  . Testosterone deficiency   . Undescended right testicle      Objective: Physical Exam General: The patient is alert and oriented x3 in no acute distress.  Dermatology: Skin is warm, dry and supple bilateral lower extremities. Negative for open lesions or macerations bilateral.  Erythema and dermatitis noted along the weightbearing surfaces of the foot consistent with findings of tinea pedis.  Xerosis of the skin also noted.  Vascular: Dorsalis Pedis and Posterior Tibial pulses palpable bilateral.  Capillary fill time is immediate to all digits.  Neurological: Epicritic and protective threshold intact bilateral.   Musculoskeletal: Tenderness to palpation to the plantar aspect of the left heel along the plantar fascia. All other joints range of motion within normal limits bilateral. Strength 5/5 in all  groups bilateral.   Radiographic exam: Normal osseous mineralization. Joint spaces preserved. No fracture/dislocation/boney destruction. No other soft tissue abnormalities or radiopaque foreign bodies.  Plantar heel spur noted on lateral view  Assessment: 1. Plantar fasciitis left foot 2.  Tinea pedis left  Plan of Care:  1. Patient evaluated. Xrays reviewed.   2. Injection of 0.5cc Celestone soluspan injected into the left plantar fascia.  3.  Continue meloxicam 15 mg daily as prescribed by PCP  4.  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  Prescription for Lamisil turned 50 mg #30 daily 6.  Prescription for Lotrisone cream apply 2 times daily 7.  Return to clinic as needed  *Retired June 2022  Edrick Kins, DPM [facility]  Dr. Edrick Kins, DPM    [address], [place] [address]                Office [phone]  Fax [phone]

## 2020-08-13 ENCOUNTER — Ambulatory Visit: Payer: Managed Care, Other (non HMO) | Admitting: Podiatry

## 2020-08-14 DIAGNOSIS — M179 Osteoarthritis of knee, unspecified: Secondary | ICD-10-CM | POA: Diagnosis not present

## 2020-08-14 DIAGNOSIS — M25561 Pain in right knee: Secondary | ICD-10-CM | POA: Diagnosis not present

## 2020-08-17 DIAGNOSIS — M222X1 Patellofemoral disorders, right knee: Secondary | ICD-10-CM | POA: Diagnosis not present

## 2020-08-17 DIAGNOSIS — M25561 Pain in right knee: Secondary | ICD-10-CM | POA: Diagnosis not present

## 2020-08-29 DIAGNOSIS — M23031 Cystic meniscus, other medial meniscus, right knee: Secondary | ICD-10-CM | POA: Diagnosis not present

## 2020-08-29 DIAGNOSIS — M67961 Unspecified disorder of synovium and tendon, right lower leg: Secondary | ICD-10-CM | POA: Diagnosis not present

## 2020-08-29 DIAGNOSIS — M1711 Unilateral primary osteoarthritis, right knee: Secondary | ICD-10-CM | POA: Diagnosis not present

## 2020-09-12 DIAGNOSIS — M1711 Unilateral primary osteoarthritis, right knee: Secondary | ICD-10-CM | POA: Diagnosis not present

## 2020-09-19 DIAGNOSIS — M1711 Unilateral primary osteoarthritis, right knee: Secondary | ICD-10-CM | POA: Diagnosis not present

## 2020-09-24 ENCOUNTER — Other Ambulatory Visit: Payer: Self-pay

## 2020-09-24 DIAGNOSIS — F988 Other specified behavioral and emotional disorders with onset usually occurring in childhood and adolescence: Secondary | ICD-10-CM

## 2020-09-25 MED ORDER — AMPHETAMINE-DEXTROAMPHET ER 20 MG PO CP24: 20.0000 mg | ORAL_CAPSULE | Freq: Every day | ORAL | 0 refills | Status: DC

## 2020-09-26 ENCOUNTER — Other Ambulatory Visit: Payer: Self-pay

## 2020-09-26 ENCOUNTER — Ambulatory Visit: Payer: Self-pay

## 2020-09-26 ENCOUNTER — Other Ambulatory Visit: Payer: Self-pay | Admitting: Physician Assistant

## 2020-09-26 DIAGNOSIS — M7542 Impingement syndrome of left shoulder: Secondary | ICD-10-CM | POA: Diagnosis not present

## 2020-09-26 DIAGNOSIS — M179 Osteoarthritis of knee, unspecified: Secondary | ICD-10-CM | POA: Diagnosis not present

## 2020-09-26 DIAGNOSIS — E291 Testicular hypofunction: Secondary | ICD-10-CM

## 2020-09-26 DIAGNOSIS — M545 Low back pain, unspecified: Secondary | ICD-10-CM

## 2020-09-28 ENCOUNTER — Other Ambulatory Visit: Payer: Self-pay

## 2020-09-28 NOTE — Telephone Encounter (Signed)
Was going to send this to Randel Pigg, PA-C [facility] Interim Provider) but it was still in [place] from 09/25/20.  AMD

## 2020-10-01 NOTE — Progress Notes (Deleted)
Scheduled to complete physical 10/10/20 with Ron Smith, PA-C.  AMD 

## 2020-10-02 DIAGNOSIS — Z0000 Encounter for general adult medical examination without abnormal findings: Secondary | ICD-10-CM

## 2020-10-03 NOTE — Progress Notes (Signed)
Pt scheduled to complete physical 10/10/20. CL,RMA

## 2020-10-04 ENCOUNTER — Other Ambulatory Visit: Payer: Self-pay

## 2020-10-04 ENCOUNTER — Ambulatory Visit: Payer: Self-pay

## 2020-10-04 DIAGNOSIS — Z01818 Encounter for other preprocedural examination: Secondary | ICD-10-CM

## 2020-10-04 LAB — POCT URINALYSIS DIPSTICK
Bilirubin, UA: NEGATIVE
Blood, UA: NEGATIVE
Glucose, UA: NEGATIVE
Ketones, UA: NEGATIVE
Leukocytes, UA: NEGATIVE
Nitrite, UA: NEGATIVE
Protein, UA: NEGATIVE
Spec Grav, UA: 1.01 (ref 1.010–1.025)
Urobilinogen, UA: 0.2 U/dL
pH, UA: 8 (ref 5.0–8.0)

## 2020-10-06 LAB — CMP12+LP+TP+TSH+6AC+PSA+CBC…
ALT: 49 IU/L — ABNORMAL HIGH (ref 0–44)
AST: 41 IU/L — ABNORMAL HIGH (ref 0–40)
Albumin/Globulin Ratio: 1.5 (ref 1.2–2.2)
Albumin: 4.1 g/dL (ref 4.0–5.0)
Alkaline Phosphatase: 77 IU/L (ref 44–121)
BUN/Creatinine Ratio: 13 (ref 9–20)
BUN: 13 mg/dL (ref 6–24)
Basophils Absolute: 0.1 10*3/uL (ref 0.0–0.2)
Basos: 1 %
Bilirubin Total: 0.5 mg/dL (ref 0.0–1.2)
Calcium: 9.3 mg/dL (ref 8.7–10.2)
Chloride: 102 mmol/L (ref 96–106)
Chol/HDL Ratio: 3.6 ratio (ref 0.0–5.0)
Cholesterol, Total: 109 mg/dL (ref 100–199)
Creatinine, Ser: 1.03 mg/dL (ref 0.76–1.27)
EOS (ABSOLUTE): 0.2 10*3/uL (ref 0.0–0.4)
Eos: 2 %
Estimated CHD Risk: 0.6 times avg. (ref 0.0–1.0)
Free Thyroxine Index: 3.3 (ref 1.2–4.9)
GGT: 25 IU/L (ref 0–65)
Globulin, Total: 2.7 g/dL (ref 1.5–4.5)
Glucose: 60 mg/dL — ABNORMAL LOW (ref 65–99)
HDL: 30 mg/dL — ABNORMAL LOW (ref >39)
Hematocrit: 48.7 % (ref 37.5–51.0)
Hemoglobin: 15.7 g/dL (ref 13.0–17.7)
Immature Grans (Abs): 0.1 10*3/uL (ref 0.0–0.1)
Immature Granulocytes: 1 %
Iron: 58 ug/dL (ref 38–169)
LDH: 311 IU/L — ABNORMAL HIGH (ref 121–224)
LDL Chol Calc (NIH): 63 mg/dL (ref 0–99)
Lymphocytes Absolute: 2.1 10*3/uL (ref 0.7–3.1)
Lymphs: 19 %
MCH: 27.7 pg (ref 26.6–33.0)
MCHC: 32.2 g/dL (ref 31.5–35.7)
MCV: 86 fL (ref 79–97)
Monocytes Absolute: 0.7 10*3/uL (ref 0.1–0.9)
Monocytes: 7 %
Neutrophils Absolute: 7.7 10*3/uL — ABNORMAL HIGH (ref 1.4–7.0)
Neutrophils: 70 %
Phosphorus: 3 mg/dL (ref 2.8–4.1)
Platelets: 335 10*3/uL (ref 150–450)
Potassium: 5 mmol/L (ref 3.5–5.2)
Prostate Specific Ag, Serum: 0.3 ng/mL (ref 0.0–4.0)
RBC: 5.67 x10E6/uL (ref 4.14–5.80)
RDW: 14.2 % (ref 11.6–15.4)
Sodium: 141 mmol/L (ref 134–144)
T3 Uptake Ratio: 32 % (ref 24–39)
T4, Total: 10.2 ug/dL (ref 4.5–12.0)
TSH: 2.54 u[IU]/mL (ref 0.450–4.500)
Total Protein: 6.8 g/dL (ref 6.0–8.5)
Triglycerides: 80 mg/dL (ref 0–149)
Uric Acid: 7.5 mg/dL (ref 3.8–8.4)
VLDL Cholesterol Cal: 16 mg/dL (ref 5–40)
WBC: 10.9 10*3/uL — ABNORMAL HIGH (ref 3.4–10.8)
eGFR: 88 mL/min/{1.73_m2} (ref >59)

## 2020-10-06 LAB — TESTOSTERONE,FREE AND TOTAL
Testosterone, Free: 5.1 pg/mL — ABNORMAL LOW (ref 7.2–24.0)
Testosterone: 487 ng/dL (ref 264–916)

## 2020-10-10 ENCOUNTER — Other Ambulatory Visit: Payer: Self-pay

## 2020-10-10 ENCOUNTER — Ambulatory Visit: Payer: Self-pay | Admitting: Physician Assistant

## 2020-10-10 ENCOUNTER — Encounter: Payer: Self-pay | Admitting: Physician Assistant

## 2020-10-10 VITALS — BP 137/81 | HR 78 | Temp 98.1°F | Resp 14 | Ht 73.0 in | Wt 350.0 lb

## 2020-10-10 DIAGNOSIS — Z0000 Encounter for general adult medical examination without abnormal findings: Secondary | ICD-10-CM

## 2020-10-10 NOTE — Progress Notes (Signed)
   Subjective: Annual physical exam    Patient ID: Jeffrey Carlson, male    DOB: January 08, 1971, 50 y.o.   MRN: [id number]  HPI Patient presents for physical exam voices no concerns or complaints.   Review of Systems     ADHD, GERD, BPH, hypogonadism, and hypertension. Objective:   Physical Exam No acute distress.  BMI is 46.18. BP is 137/81, pulse 78, respiration 14, temperature 98.1, 100% O2 sat on room air, weight 350 pounds, height 6 feet 1 inch.  HEENT is unremarkable.  Neck is supple for appendectomy or bruits.  Lungs are clear to auscultation.  Heart is regular rate and rhythm.  EKG revealed right bundle branch block unchanged from previous EKGs..  Abdomen distended secondary to body habitus, normoactive bowel sounds, soft, nontender to palpation.  No obvious deformity to the upper or lower extremities.  Patient has full and equal range of motion of the upper and lower extremities.  No cervical or lumbar spine deformity.  Patient has full and equal range of motion cervical lumbar spine.  Cranial nerves II through XII are grossly intact.         Assessment & Plan: Well exam.  Discussed lab results with patient.  Plan continue previous medication follow-up as needed.

## 2020-10-29 ENCOUNTER — Other Ambulatory Visit: Payer: Self-pay

## 2020-10-29 ENCOUNTER — Telehealth: Payer: Self-pay

## 2020-10-29 DIAGNOSIS — M545 Low back pain, unspecified: Secondary | ICD-10-CM

## 2020-10-29 DIAGNOSIS — F988 Other specified behavioral and emotional disorders with onset usually occurring in childhood and adolescence: Secondary | ICD-10-CM

## 2020-10-29 MED ORDER — MELOXICAM 15 MG PO TABS: 1.0000 | ORAL_TABLET | Freq: Every day | ORAL | 2 refills | Status: DC

## 2020-10-29 MED ORDER — AMPHETAMINE-DEXTROAMPHET ER 20 MG PO CP24
20.0000 mg | ORAL_CAPSULE | Freq: Every day | ORAL | 0 refills | Status: DC
Start: 2020-10-29 — End: 2020-12-05

## 2020-10-29 NOTE — Telephone Encounter (Signed)
Elta Guadeloupe called to request refills for Adderrall and Meloxicam be called into [facility]

## 2020-11-12 ENCOUNTER — Other Ambulatory Visit: Payer: Self-pay

## 2020-11-12 ENCOUNTER — Ambulatory Visit: Payer: Self-pay

## 2020-11-12 DIAGNOSIS — E291 Testicular hypofunction: Secondary | ICD-10-CM

## 2020-11-20 ENCOUNTER — Ambulatory Visit: Payer: Self-pay | Admitting: Nurse Practitioner

## 2020-11-20 ENCOUNTER — Other Ambulatory Visit: Payer: Self-pay

## 2020-11-20 ENCOUNTER — Encounter: Payer: Self-pay | Admitting: Nurse Practitioner

## 2020-11-20 VITALS — BP 121/82 | HR 90 | Temp 98.3°F | Resp 12 | Ht 73.0 in | Wt 346.0 lb

## 2020-11-20 DIAGNOSIS — M62838 Other muscle spasm: Secondary | ICD-10-CM

## 2020-11-20 MED ORDER — CYCLOBENZAPRINE HCL 5 MG PO TABS: 5.0000 mg | ORAL_TABLET | Freq: Three times a day (TID) | ORAL | 0 refills | Status: AC | PRN

## 2020-11-20 NOTE — Progress Notes (Signed)
   Subjective:    Patient ID: Jeffrey Carlson, male    DOB: February 25, 1971, 49 y.o.   MRN: [id number]  HPI  50 year old male presenting to [place] with c/o posterior neck and occipital tightness with pain for the past 48 hours. He cannot think of any specific event that may have caused onset aside from driving with his teenage son that is learning to drive.   Denies radiation to arms, denies numbness or tingling or weakness to extremities.   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 Has been under increased stress as retirement gets closer and having more work to finish prior to that time.   Denies HA, denies fever or body aches or other systemic symptoms.   He did take one flexeril last night and had improvement with increased ROM after taking that medication.   Also takes Meloxicam daily for generalized joint pains.   Today's Vitals   11/20/20 0852  BP: 121/82  Pulse: 90  Resp: 12  Temp: 98.3 F (36.8 C)  TempSrc: Oral  SpO2: 100%  Weight: (!) 346 lb (156.9 kg)  Height: 6\' 1"  (1.854 m)   Body mass index is 45.65 kg/m. Review of Systems  Constitutional: Negative.   HENT: Negative.   Cardiovascular: Negative.   Musculoskeletal: Positive for arthralgias and neck pain.  Skin: Negative.   Neurological: Negative.        Objective:   Physical Exam HENT:     Head: Normocephalic.  Eyes:     Pupils: Pupils are equal, round, and reactive to light.     Comments: Negative for photophobia   Neck:     Comments: Decreased ability to rotate head over left shoulder. Pain increased with lateral rotation more than with flexion and extension.  Musculoskeletal:     Cervical back: Neck supple. Tenderness present. No rigidity. Muscular tenderness present. No spinous process tenderness. Decreased range of motion.  Lymphadenopathy:     Cervical: No cervical adenopathy.  Neurological:     Mental Status: He is alert.           Assessment & Plan:  Continue Mobic as prescribed for  pain relief. May alternate with tylenol for breakthrough pain.  Heat to neck in 10 minute increments.   Light massage to neck/occipital region  Flexeril as needed with light ROM exercises.   RTC with new symptoms, worsening symptoms or concerns.  Should not need muscle relaxer beyond this week if pain or limited ROM continues RTC as discussed.   Meds ordered this encounter  Medications  . cyclobenzaprine (FLEXERIL) 5 MG tablet    Sig: Take 1 tablet (5 mg total) by mouth 3 (three) times daily as needed for muscle spasms.    Dispense:  45 tablet    Refill:  0

## 2020-11-20 NOTE — Progress Notes (Signed)
Neck started hurting Sunday. Traps are sore & across the base of the skull. Maybe a little sinus headache. Can't turn head all the way on either side. Couldn't sleep in the bed last night  No known injury  Has been taking ibuprofen, using a heating pad & used deep blue topical .  AMD

## 2020-11-27 ENCOUNTER — Ambulatory Visit: Payer: Self-pay | Admitting: Nurse Practitioner

## 2020-12-05 ENCOUNTER — Other Ambulatory Visit: Payer: Self-pay

## 2020-12-05 ENCOUNTER — Ambulatory Visit: Payer: Self-pay

## 2020-12-05 DIAGNOSIS — E291 Testicular hypofunction: Secondary | ICD-10-CM

## 2020-12-05 DIAGNOSIS — F988 Other specified behavioral and emotional disorders with onset usually occurring in childhood and adolescence: Secondary | ICD-10-CM

## 2020-12-06 MED ORDER — TESTOSTERONE CYPIONATE 200 MG/ML IM SOLN: 100.0000 mg | INTRAMUSCULAR | 1 refills | Status: DC

## 2020-12-06 MED ORDER — AMPHETAMINE-DEXTROAMPHET ER 20 MG PO CP24: 20.0000 mg | ORAL_CAPSULE | Freq: Every day | ORAL | 0 refills | Status: DC

## 2020-12-07 NOTE — Progress Notes (Signed)
Pt presents today to follow up with neck pain and to have labs check t-level due to getting injections every two weeks. CL,RMA

## 2020-12-11 ENCOUNTER — Ambulatory Visit: Payer: 59

## 2020-12-11 ENCOUNTER — Encounter: Payer: Self-pay | Admitting: Nurse Practitioner

## 2020-12-11 ENCOUNTER — Other Ambulatory Visit: Payer: Self-pay

## 2020-12-11 ENCOUNTER — Ambulatory Visit: Payer: Self-pay | Admitting: Nurse Practitioner

## 2020-12-11 VITALS — BP 141/79 | HR 94 | Temp 97.1°F | Resp 16

## 2020-12-11 DIAGNOSIS — E291 Testicular hypofunction: Secondary | ICD-10-CM

## 2020-12-11 NOTE — Progress Notes (Signed)
   Subjective:    Patient ID: Jeffrey Carlson, male    DOB: 03/09/1971, 50 y.o.   MRN: [id number]  HPI  50 year old male f/u today regarding neck pain experienced this month evaluated on 11/20/20.  Used Flexeril for two nights and symptoms significantly improved.  Did have intermittent pain in right arm that has resolved.  Denies numbness or weakness to extremity.   History of lumbar surgery and ruptured discs.  Historical imaging available of lumbar and shoulder only.   Retiring from [employer] today, plans to work part time in retirement.   Also here for Testosterone labs-  Review of Systems  Constitutional: Negative.   HENT: Negative.   Respiratory: Negative.   Musculoskeletal: Positive for arthralgias and neck pain.  Neurological: Negative.    Current Outpatient Medications  Medication Instructions  . amphetamine-dextroamphetamine (ADDERALL XR) 20 MG 24 hr capsule 20 mg, Oral, Daily  . clotrimazole-betamethasone (LOTRISONE) cream 1 application, Topical, 2 times daily  . cyclobenzaprine (FLEXERIL) 5 mg, Oral, 3 times daily PRN  . esomeprazole (NEXIUM) 40 mg, Daily  . lisinopril (ZESTRIL) 20 MG tablet TAKE 1 TABLET BY MOUTH EVERY DAY  . meloxicam (MOBIC) 15 mg, Oral, Daily  . tamsulosin (FLOMAX) 0.4 mg, Oral, Daily  . testosterone cypionate (DEPO-TESTOSTERONE) 100 mg, Intramuscular, Every 14 days    Past Medical History:  Diagnosis Date  . Adult ADHD   . Arthritis   . Barrett esophagus   . Colon polyps   . Degenerative joint disease   . GERD (gastroesophageal reflux disease)   . Hiatal hernia   . Hypertension    dr Burt Ek   [place]  . Leukocytosis   . Obesity   . Polycythemia, secondary 12/20/2014  . Sleep apnea    cpap  . Testosterone deficiency   . Undescended right testicle       Objective:   Physical Exam HENT:     Head: Normocephalic.  Musculoskeletal:     Cervical back: Normal range of motion. Tenderness present.  Skin:    General: Skin is  warm.  Neurological:     General: No focal deficit present.     Mental Status: He is alert.  Psychiatric:        Mood and Affect: Mood normal.           Assessment & Plan:  Discussed options with neck moving forward.  Advised massage, stretching, frequent change in position. He will need referral to Ortho or Neurosurgery at some point for consideration of MRI and ongoing management of neck.   This episode appears to be resolving without complication, patient aware that symptoms of consistent pain into arm, numbness or tingling or weakness warrant immediate follow up and referral.   Will RTC as needed at this time.  Will f/u with lab results when available.

## 2020-12-13 ENCOUNTER — Encounter: Payer: Self-pay | Admitting: Nurse Practitioner

## 2020-12-13 LAB — TESTOSTERONE,FREE AND TOTAL
Testosterone, Free: 4.2 pg/mL — ABNORMAL LOW (ref 7.2–24.0)
Testosterone: 325 ng/dL (ref 264–916)

## 2020-12-21 ENCOUNTER — Other Ambulatory Visit: Payer: Self-pay | Admitting: Physician Assistant

## 2020-12-21 DIAGNOSIS — I10 Essential (primary) hypertension: Secondary | ICD-10-CM

## 2020-12-27 ENCOUNTER — Ambulatory Visit: Payer: 59

## 2020-12-27 ENCOUNTER — Ambulatory Visit: Payer: Self-pay

## 2020-12-27 ENCOUNTER — Other Ambulatory Visit: Payer: Self-pay

## 2020-12-27 DIAGNOSIS — E291 Testicular hypofunction: Secondary | ICD-10-CM

## 2020-12-27 NOTE — Progress Notes (Signed)
Pt presents today for bi-weekly testosterone injection. CL,RMA

## 2020-12-31 ENCOUNTER — Other Ambulatory Visit: Payer: Self-pay

## 2020-12-31 DIAGNOSIS — F988 Other specified behavioral and emotional disorders with onset usually occurring in childhood and adolescence: Secondary | ICD-10-CM

## 2021-01-01 MED ORDER — AMPHETAMINE-DEXTROAMPHET ER 20 MG PO CP24: 20.0000 mg | ORAL_CAPSULE | Freq: Every day | ORAL | 0 refills | Status: DC

## 2021-02-09 ENCOUNTER — Other Ambulatory Visit: Payer: Self-pay | Admitting: Physician Assistant

## 2021-02-09 DIAGNOSIS — E291 Testicular hypofunction: Secondary | ICD-10-CM

## 2021-02-15 ENCOUNTER — Other Ambulatory Visit: Payer: Self-pay

## 2021-02-15 ENCOUNTER — Ambulatory Visit: Payer: Self-pay

## 2021-02-15 DIAGNOSIS — F988 Other specified behavioral and emotional disorders with onset usually occurring in childhood and adolescence: Secondary | ICD-10-CM

## 2021-02-15 DIAGNOSIS — E291 Testicular hypofunction: Secondary | ICD-10-CM

## 2021-02-15 DIAGNOSIS — M545 Low back pain, unspecified: Secondary | ICD-10-CM

## 2021-02-15 MED ORDER — TESTOSTERONE CYPIONATE 200 MG/ML IM SOLN
100.0000 mg | INTRAMUSCULAR | Status: AC
  Administered 2021-02-15 – 2021-06-19 (×4): 100 mg via INTRAMUSCULAR

## 2021-02-15 MED ORDER — AMPHETAMINE-DEXTROAMPHET ER 20 MG PO CP24: 20.0000 mg | ORAL_CAPSULE | Freq: Every day | ORAL | 0 refills | Status: DC

## 2021-02-15 MED ORDER — MELOXICAM 15 MG PO TABS: 15.0000 mg | ORAL_TABLET | Freq: Every day | ORAL | 2 refills | Status: DC

## 2021-02-15 NOTE — Addendum Note (Signed)
Addended by: Malachy Moan F on: 02/15/2021 12:03 PM   Modules accepted: Orders

## 2021-02-15 NOTE — Progress Notes (Signed)
Pt received bi-weekly injection. Pt also requested medication to be filled. I sent two refills to [facility] provider is on call today and pt is aware. Burna Sis

## 2021-02-20 ENCOUNTER — Other Ambulatory Visit: Payer: Self-pay

## 2021-02-20 NOTE — Progress Notes (Signed)
Lab [facility] ID: [id number]

## 2021-03-04 DIAGNOSIS — I872 Venous insufficiency (chronic) (peripheral): Secondary | ICD-10-CM | POA: Diagnosis not present

## 2021-03-04 DIAGNOSIS — B354 Tinea corporis: Secondary | ICD-10-CM | POA: Diagnosis not present

## 2021-03-19 ENCOUNTER — Other Ambulatory Visit: Payer: Self-pay

## 2021-03-19 DIAGNOSIS — F988 Other specified behavioral and emotional disorders with onset usually occurring in childhood and adolescence: Secondary | ICD-10-CM

## 2021-03-21 MED ORDER — AMPHETAMINE-DEXTROAMPHET ER 20 MG PO CP24
20.0000 mg | ORAL_CAPSULE | Freq: Every day | ORAL | 0 refills | Status: DC
Start: 2021-03-21 — End: 2021-04-29

## 2021-04-04 ENCOUNTER — Other Ambulatory Visit: Payer: Self-pay

## 2021-04-04 DIAGNOSIS — I10 Essential (primary) hypertension: Secondary | ICD-10-CM

## 2021-04-04 MED ORDER — LISINOPRIL 20 MG PO TABS
20.0000 mg | ORAL_TABLET | Freq: Every day | ORAL | 3 refills | Status: DC
Start: 2021-04-04 — End: 2022-07-17

## 2021-04-05 ENCOUNTER — Other Ambulatory Visit: Payer: Self-pay

## 2021-04-05 ENCOUNTER — Ambulatory Visit: Payer: Self-pay

## 2021-04-05 VITALS — BP 142/92 | HR 73

## 2021-04-05 DIAGNOSIS — E291 Testicular hypofunction: Secondary | ICD-10-CM

## 2021-04-05 DIAGNOSIS — Z0130 Encounter for examination of blood pressure without abnormal findings: Secondary | ICD-10-CM

## 2021-04-05 NOTE — Progress Notes (Signed)
Pt received bi weekly T injection/CL,RMA

## 2021-04-19 DIAGNOSIS — M1711 Unilateral primary osteoarthritis, right knee: Secondary | ICD-10-CM | POA: Diagnosis not present

## 2021-04-26 DIAGNOSIS — M1712 Unilateral primary osteoarthritis, left knee: Secondary | ICD-10-CM | POA: Diagnosis not present

## 2021-04-26 DIAGNOSIS — M179 Osteoarthritis of knee, unspecified: Secondary | ICD-10-CM | POA: Diagnosis not present

## 2021-04-29 ENCOUNTER — Other Ambulatory Visit: Payer: Self-pay

## 2021-04-29 DIAGNOSIS — F988 Other specified behavioral and emotional disorders with onset usually occurring in childhood and adolescence: Secondary | ICD-10-CM

## 2021-04-29 MED ORDER — AMPHETAMINE-DEXTROAMPHET ER 20 MG PO CP24: 20.0000 mg | ORAL_CAPSULE | Freq: Every day | ORAL | 0 refills | Status: DC

## 2021-05-02 ENCOUNTER — Encounter: Payer: Self-pay | Admitting: Physician Assistant

## 2021-05-02 ENCOUNTER — Other Ambulatory Visit: Payer: Self-pay

## 2021-05-02 ENCOUNTER — Ambulatory Visit: Payer: Self-pay | Admitting: Physician Assistant

## 2021-05-02 VITALS — BP 144/88 | HR 77 | Temp 97.8°F | Resp 16 | Ht 73.0 in | Wt 320.0 lb

## 2021-05-02 DIAGNOSIS — J0100 Acute maxillary sinusitis, unspecified: Secondary | ICD-10-CM

## 2021-05-02 DIAGNOSIS — F988 Other specified behavioral and emotional disorders with onset usually occurring in childhood and adolescence: Secondary | ICD-10-CM

## 2021-05-02 MED ORDER — FEXOFENADINE-PSEUDOEPHED ER 60-120 MG PO TB12: 1.0000 | ORAL_TABLET | Freq: Two times a day (BID) | ORAL | 0 refills | Status: DC

## 2021-05-02 MED ORDER — AMPHETAMINE-DEXTROAMPHET ER 20 MG PO CP24: 20.0000 mg | ORAL_CAPSULE | Freq: Every day | ORAL | 0 refills | Status: DC

## 2021-05-02 MED ORDER — AMOXICILLIN 875 MG PO TABS: 875.0000 mg | ORAL_TABLET | Freq: Two times a day (BID) | ORAL | 0 refills | Status: AC

## 2021-05-02 NOTE — Progress Notes (Signed)
Pt presents today for medication refill and testosterone injection. Pt has had some sinus congestion and sinus pain for about 6 days./CL,RMA

## 2021-05-02 NOTE — Progress Notes (Signed)
   Subjective: Medication refill    Patient ID: Jeffrey Carlson, male    DOB: [birth year], 50 y.o.   MRN: [id number]  HPI Patient here for medication refill for ADHD.  Patient takes Adderall 20 mg every morning.  States tolerating medications with no side effects.  Patient also complains 2 weeks of facial pain, nasal congestion, and left ear pain.  Patient states awakening in the mornings with greenish-yellowish discharge.  Denies fever chills or complaint.   Review of Systems ADHD, BPH, GERD, hypertension, and hypogonadism.    Objective:   Physical Exam No acute distress.  Temperature 97.8, pulse 77, respiration 16, BP is 144/88, and patient 90% O2 sat on room air.  Patient weighs 320 pounds and BMI is 42.22. HEENT is remarkable for bilateral maxillary guarding.  Edematous nasal turbinates.  Edematous but not erythematous left TM.  Postnasal drainage.  Lungs are clear to auscultation.  Heart regular rate and rhythm.       Assessment & Plan: Subacute maxillary sinusitis and ADHD.   Patient prescription for Adderall was refilled.  Patient given a prescription for amoxicillin and Allegra-D.  Advised to follow-up if no improvement in 3 to 5 days.

## 2021-05-03 DIAGNOSIS — M1711 Unilateral primary osteoarthritis, right knee: Secondary | ICD-10-CM | POA: Diagnosis not present

## 2021-05-03 DIAGNOSIS — M179 Osteoarthritis of knee, unspecified: Secondary | ICD-10-CM | POA: Diagnosis not present

## 2021-05-27 DIAGNOSIS — M1712 Unilateral primary osteoarthritis, left knee: Secondary | ICD-10-CM | POA: Diagnosis not present

## 2021-06-03 DIAGNOSIS — M1712 Unilateral primary osteoarthritis, left knee: Secondary | ICD-10-CM | POA: Diagnosis not present

## 2021-06-10 DIAGNOSIS — M1712 Unilateral primary osteoarthritis, left knee: Secondary | ICD-10-CM | POA: Diagnosis not present

## 2021-06-19 ENCOUNTER — Other Ambulatory Visit: Payer: Self-pay

## 2021-06-19 ENCOUNTER — Ambulatory Visit: Payer: Self-pay

## 2021-06-19 DIAGNOSIS — E291 Testicular hypofunction: Secondary | ICD-10-CM

## 2021-07-02 ENCOUNTER — Encounter: Payer: Self-pay | Admitting: Physician Assistant

## 2021-07-02 ENCOUNTER — Other Ambulatory Visit: Payer: Self-pay

## 2021-07-02 ENCOUNTER — Ambulatory Visit: Payer: 59 | Admitting: Physician Assistant

## 2021-07-02 ENCOUNTER — Other Ambulatory Visit: Payer: 59

## 2021-07-02 DIAGNOSIS — Z1152 Encounter for screening for COVID-19: Secondary | ICD-10-CM

## 2021-07-02 DIAGNOSIS — J0100 Acute maxillary sinusitis, unspecified: Secondary | ICD-10-CM

## 2021-07-02 LAB — POCT INFLUENZA A/B
Influenza A, POC: NEGATIVE
Influenza B, POC: NEGATIVE

## 2021-07-02 LAB — POC COVID19 BINAXNOW: SARS Coronavirus 2 Ag: NEGATIVE

## 2021-07-02 MED ORDER — AMOXICILLIN 875 MG PO TABS
875.0000 mg | ORAL_TABLET | Freq: Two times a day (BID) | ORAL | 0 refills | Status: DC
Start: 2021-07-02 — End: 2021-09-17

## 2021-07-02 MED ORDER — FEXOFENADINE-PSEUDOEPHED ER 60-120 MG PO TB12: 1.0000 | ORAL_TABLET | Freq: Two times a day (BID) | ORAL | 0 refills | Status: DC

## 2021-07-02 NOTE — Progress Notes (Signed)
Rapid Flu / COVID tests conducted for nasal congestion, fatigue and illness.Rapid tests negative.

## 2021-07-02 NOTE — Progress Notes (Signed)
° °  Subjective: Sinus congestion    Patient ID: Jeffrey Carlson, male    DOB: 01-31-1971, 50 y.o.   MRN: [id number]  HPI Patient complains of 1 week of sinus congestion and right ear pain.  Patient states thick nasal discharge and postnasal drainage.  Patient denies fever or chills.  Patient son tested positive for COVID-19.  Patient was negative for COVID-19 and influenza.   Review of Systems GERD, hypertension, and hypogonadism.    Objective:   Physical Exam  This a virtual visit.      Assessment & Plan: Sinusitis   Patient get a prescription amoxicillin and Allegra-D to take twice a day for 10 days.  Follow-up in improvement 5 days.

## 2021-07-16 ENCOUNTER — Other Ambulatory Visit: Payer: Self-pay

## 2021-07-16 DIAGNOSIS — F988 Other specified behavioral and emotional disorders with onset usually occurring in childhood and adolescence: Secondary | ICD-10-CM

## 2021-07-17 MED ORDER — AMPHETAMINE-DEXTROAMPHET ER 20 MG PO CP24: 20.0000 mg | ORAL_CAPSULE | Freq: Every day | ORAL | 0 refills | Status: DC

## 2021-07-19 ENCOUNTER — Other Ambulatory Visit: Payer: Self-pay | Admitting: Physician Assistant

## 2021-07-19 DIAGNOSIS — M545 Low back pain, unspecified: Secondary | ICD-10-CM

## 2021-07-25 ENCOUNTER — Other Ambulatory Visit: Payer: Self-pay | Admitting: Urology

## 2021-07-25 DIAGNOSIS — R3915 Urgency of urination: Secondary | ICD-10-CM

## 2021-08-12 ENCOUNTER — Other Ambulatory Visit: Payer: Self-pay

## 2021-08-12 DIAGNOSIS — F988 Other specified behavioral and emotional disorders with onset usually occurring in childhood and adolescence: Secondary | ICD-10-CM

## 2021-08-15 ENCOUNTER — Other Ambulatory Visit: Payer: Self-pay

## 2021-08-15 ENCOUNTER — Ambulatory Visit: Payer: Self-pay

## 2021-08-15 ENCOUNTER — Other Ambulatory Visit: Payer: Self-pay | Admitting: Physician Assistant

## 2021-08-15 DIAGNOSIS — E291 Testicular hypofunction: Secondary | ICD-10-CM

## 2021-08-15 MED ORDER — TESTOSTERONE CYPIONATE 200 MG/ML IM SOLN
100.0000 mg | INTRAMUSCULAR | Status: DC
  Administered 2021-08-15 – 2021-10-29 (×3): 100 mg via INTRAMUSCULAR

## 2021-08-15 MED ORDER — AMPHETAMINE-DEXTROAMPHET ER 20 MG PO CP24: 20.0000 mg | ORAL_CAPSULE | Freq: Every day | ORAL | 0 refills | Status: DC

## 2021-08-15 MED ORDER — MELOXICAM 15 MG PO TABS: 15.0000 mg | ORAL_TABLET | Freq: Every day | ORAL | 0 refills | Status: DC

## 2021-08-31 ENCOUNTER — Other Ambulatory Visit: Payer: Self-pay | Admitting: Urology

## 2021-08-31 DIAGNOSIS — R3915 Urgency of urination: Secondary | ICD-10-CM

## 2021-09-08 ENCOUNTER — Other Ambulatory Visit: Payer: Self-pay | Admitting: Physician Assistant

## 2021-09-08 DIAGNOSIS — M1710 Unilateral primary osteoarthritis, unspecified knee: Secondary | ICD-10-CM

## 2021-09-09 ENCOUNTER — Other Ambulatory Visit: Payer: Self-pay

## 2021-09-09 ENCOUNTER — Ambulatory Visit: Payer: Self-pay

## 2021-09-09 DIAGNOSIS — Z0000 Encounter for general adult medical examination without abnormal findings: Secondary | ICD-10-CM

## 2021-09-09 LAB — POCT URINALYSIS DIPSTICK
Bilirubin, UA: NEGATIVE
Blood, UA: NEGATIVE
Glucose, UA: NEGATIVE
Ketones, UA: NEGATIVE
Leukocytes, UA: NEGATIVE
Nitrite, UA: NEGATIVE
Protein, UA: NEGATIVE
Spec Grav, UA: 1.025 (ref 1.010–1.025)
Urobilinogen, UA: 0.2 E.U./dL
pH, UA: 6 (ref 5.0–8.0)

## 2021-09-10 ENCOUNTER — Ambulatory Visit: Payer: Self-pay

## 2021-09-10 DIAGNOSIS — Z0000 Encounter for general adult medical examination without abnormal findings: Secondary | ICD-10-CM

## 2021-09-16 ENCOUNTER — Encounter: Payer: Self-pay | Admitting: Physician Assistant

## 2021-09-16 LAB — CMP12+LP+TP+TSH+6AC+PSA+CBC…
ALT: 49 IU/L — ABNORMAL HIGH (ref 0–44)
AST: 36 IU/L (ref 0–40)
Albumin/Globulin Ratio: 1.6 (ref 1.2–2.2)
Albumin: 4.2 g/dL (ref 3.8–4.9)
Alkaline Phosphatase: 71 IU/L (ref 44–121)
BUN/Creatinine Ratio: 18 (ref 9–20)
BUN: 18 mg/dL (ref 6–24)
Basophils Absolute: 0 10*3/uL (ref 0.0–0.2)
Basos: 1 %
Bilirubin Total: 0.7 mg/dL (ref 0.0–1.2)
Calcium: 9.5 mg/dL (ref 8.7–10.2)
Chloride: 102 mmol/L (ref 96–106)
Chol/HDL Ratio: 3.7 ratio (ref 0.0–5.0)
Cholesterol, Total: 121 mg/dL (ref 100–199)
Creatinine, Ser: 1.01 mg/dL (ref 0.76–1.27)
EOS (ABSOLUTE): 0.2 10*3/uL (ref 0.0–0.4)
Eos: 2 %
Estimated CHD Risk: 0.6 times avg. (ref 0.0–1.0)
Free Thyroxine Index: 3 (ref 1.2–4.9)
GGT: 23 IU/L (ref 0–65)
Globulin, Total: 2.6 g/dL (ref 1.5–4.5)
Glucose: 84 mg/dL (ref 70–99)
HDL: 33 mg/dL — ABNORMAL LOW (ref >39)
Hematocrit: 48.2 % (ref 37.5–51.0)
Hemoglobin: 16.2 g/dL (ref 13.0–17.7)
Immature Grans (Abs): 0 10*3/uL (ref 0.0–0.1)
Immature Granulocytes: 0 %
Iron: 88 ug/dL (ref 38–169)
LDH: 225 IU/L — ABNORMAL HIGH (ref 121–224)
LDL Chol Calc (NIH): 71 mg/dL (ref 0–99)
Lymphocytes Absolute: 1.7 10*3/uL (ref 0.7–3.1)
Lymphs: 22 %
MCH: 27.5 pg (ref 26.6–33.0)
MCHC: 33.6 g/dL (ref 31.5–35.7)
MCV: 82 fL (ref 79–97)
Monocytes Absolute: 0.6 10*3/uL (ref 0.1–0.9)
Monocytes: 8 %
Neutrophils Absolute: 5.4 10*3/uL (ref 1.4–7.0)
Neutrophils: 67 %
Phosphorus: 2.4 mg/dL — ABNORMAL LOW (ref 2.8–4.1)
Platelets: 350 10*3/uL (ref 150–450)
Potassium: 4.7 mmol/L (ref 3.5–5.2)
Prostate Specific Ag, Serum: 0.2 ng/mL (ref 0.0–4.0)
RBC: 5.9 x10E6/uL — ABNORMAL HIGH (ref 4.14–5.80)
RDW: 13.8 % (ref 11.6–15.4)
Sodium: 139 mmol/L (ref 134–144)
T3 Uptake Ratio: 29 % (ref 24–39)
T4, Total: 10.5 ug/dL (ref 4.5–12.0)
TSH: 2.86 u[IU]/mL (ref 0.450–4.500)
Total Protein: 6.8 g/dL (ref 6.0–8.5)
Triglycerides: 85 mg/dL (ref 0–149)
Uric Acid: 7.3 mg/dL (ref 3.8–8.4)
VLDL Cholesterol Cal: 17 mg/dL (ref 5–40)
WBC: 8 10*3/uL (ref 3.4–10.8)
eGFR: 90 mL/min/{1.73_m2} (ref >59)

## 2021-09-16 LAB — TESTOSTERONE,FREE AND TOTAL
Testosterone, Free: 1.2 pg/mL — ABNORMAL LOW (ref 7.2–24.0)
Testosterone: 128 ng/dL — ABNORMAL LOW (ref 264–916)

## 2021-09-17 ENCOUNTER — Encounter: Payer: Self-pay | Admitting: Physician Assistant

## 2021-09-17 ENCOUNTER — Ambulatory Visit: Payer: Self-pay | Admitting: Physician Assistant

## 2021-09-17 ENCOUNTER — Other Ambulatory Visit: Payer: Self-pay

## 2021-09-17 DIAGNOSIS — R3915 Urgency of urination: Secondary | ICD-10-CM

## 2021-09-17 DIAGNOSIS — F988 Other specified behavioral and emotional disorders with onset usually occurring in childhood and adolescence: Secondary | ICD-10-CM

## 2021-09-17 MED ORDER — AMPHETAMINE-DEXTROAMPHET ER 20 MG PO CP24: 20.0000 mg | ORAL_CAPSULE | Freq: Every day | ORAL | 0 refills | Status: DC

## 2021-09-17 MED ORDER — TAMSULOSIN HCL 0.4 MG PO CAPS: 0.4000 mg | ORAL_CAPSULE | Freq: Every day | ORAL | 3 refills | Status: DC

## 2021-09-17 MED ORDER — METHYLPREDNISOLONE 4 MG PO TBPK: ORAL_TABLET | ORAL | 0 refills | Status: DC

## 2021-09-17 MED ORDER — ECONAZOLE NITRATE 1 % EX CREA: TOPICAL_CREAM | Freq: Two times a day (BID) | CUTANEOUS | 1 refills | Status: AC

## 2021-09-17 NOTE — Progress Notes (Signed)
[facility]  ____________________________________________   None    (approximate)  I have reviewed the triage vital signs and the nursing notes.   HISTORY  Chief Complaint Annual Exam  HPI Jeffrey Carlson is a 51 y.o. male patient presents annual physical exam.  Patient was concerned for cervical radiculopathy to the right upper extremity.  This is a ongoing complaint with patient with intermittent flareups.  Patient not been evaluated by orthopedics or neurologist for this complaint.  Patient also requesting refill of maintenance medications.         Past Medical History:  Diagnosis Date   Adult ADHD    Arthritis    Barrett esophagus    Colon polyps    Degenerative joint disease    GERD (gastroesophageal reflux disease)    Hiatal hernia    Hypertension    dr Burt Ek   Gage   Leukocytosis    Obesity    Polycythemia, secondary 12/20/2014   Sleep apnea    cpap   Testosterone deficiency    Undescended right testicle     Patient Active Problem List   Diagnosis Date Noted   Hypophosphatemia 09/26/2019   Iron deficiency anemia 02/15/2019   Hypogonadism in male 02/15/2019   Essential hypertension 02/15/2019   Gastroesophageal reflux disease 02/15/2019   History of colonic polyps 02/15/2019   Attention deficit hyperactivity disorder (ADHD) 02/15/2019   Obesity, Class III, BMI 40-49.9 (morbid obesity) ([place]) 07/19/2018   Osteoarthritis of knee 09/08/2017   Atypical chest pain 01/18/2015   Bundle branch block, right 01/18/2015   Breath shortness 01/18/2015   Polycythemia, secondary 12/20/2014   Rotator cuff tear 10/10/2013    Past Surgical History:  Procedure Laterality Date   APPENDECTOMY     BACK SURGERY     CHOLECYSTECTOMY     COLONOSCOPY  06/2010   + small bowel capsule study   EYE SURGERY     childhood   KNEE SURGERY     ROTATOR CUFF REPAIR     rt shoulder    Prior to Admission medications   Medication Sig Start Date End  Date Taking? Authorizing Provider  amphetamine-dextroamphetamine (ADDERALL XR) 20 MG 24 hr capsule Take 1 capsule (20 mg total) by mouth daily. 09/17/21  Yes Sable Feil, PA-C  clotrimazole-betamethasone (LOTRISONE) cream Apply 1 application topically 2 (two) times daily. 08/10/20  Yes Edrick Kins, DPM  econazole nitrate 1 % cream Apply topically 2 (two) times daily. 09/17/21  Yes Sable Feil, PA-C  esomeprazole (NEXIUM) 40 MG capsule Take 40 mg by mouth daily at 12 noon.   Yes [provider]  fexofenadine-pseudoephedrine (ALLEGRA-D) 60-120 MG 12 hr tablet Take 1 tablet by mouth 2 (two) times daily. 07/02/21  Yes Sable Feil, PA-C  lisinopril (ZESTRIL) 20 MG tablet Take 1 tablet (20 mg total) by mouth daily. 04/04/21  Yes Sable Feil, PA-C  meloxicam (MOBIC) 15 MG tablet Take 1 tablet (15 mg total) by mouth daily. 08/15/21  Yes Sable Feil, PA-C  methylPREDNISolone (MEDROL DOSEPAK) 4 MG TBPK tablet Take Tapered dose as directed 09/17/21  Yes Sable Feil, PA-C  tamsulosin (FLOMAX) 0.4 MG CAPS capsule Take 1 capsule (0.4 mg total) by mouth daily. 09/17/21  Yes Sable Feil, PA-C  testosterone cypionate (DEPO-TESTOSTERONE) 200 MG/ML injection Inject 0.5 mLs (100 mg total) into the muscle every 14 (fourteen) days. 12/06/20  Yes Sable Feil, PA-C  cyclobenzaprine (FLEXERIL) 5 MG tablet Take 1 tablet (  5 mg total) by mouth 3 (three) times daily as needed for muscle spasms. Patient not taking: Reported on 09/17/2021 11/20/20   Apolonio Schneiders, FNP    Allergies Patient has no known allergies.  Family History  Problem Relation Age of Onset   Thyroid cancer Father    Rheum arthritis Mother    Abnormal EKG Mother    Colon cancer Paternal Grandmother     Social History Social History   Tobacco Use   Smoking status: Never   Smokeless tobacco: Former    Types: Nurse, children's Use: Never used  Substance Use Topics   Alcohol use: Yes    Alcohol/week: 0.0  standard drinks    Comment: occ   Drug use: No    Review of Systems Constitutional: No fever/chills Eyes: No visual changes. ENT: No sore throat. Cardiovascular: Denies chest pain. Respiratory: Denies shortness of breath. Gastrointestinal: No abdominal pain.  No nausea, no vomiting.  No diarrhea.  No constipation.  GERD. Genitourinary: Negative for dysuria.  BPH and hypogonadism Musculoskeletal: Negative for back pain. Skin: Negative for rash. Neurological: Negative for headaches, focal weakness or numbness. Psychiatric: ADHD and sleep apnea Endocrine: Hypertension      ____________________________________________   PHYSICAL EXAM:  VITAL SIGNS: Temperature 97.6, pulse 88, respiration 14, BP is 133/77, and patient 97% O2 sat on room air.  Patient weighs 340 pounds and BMI is 46.11. Constitutional: Alert and oriented. Well appearing and in no acute distress. Eyes: Conjunctivae are normal. PERRL. EOMI. Head: Atraumatic. Nose: No congestion/rhinnorhea. Mouth/Throat: Mucous membranes are moist.  Oropharynx non-erythematous. Neck: No stridor.  No cervical spine tenderness to palpation. Hematological/Lymphatic/Immunilogical: No cervical lymphadenopathy. Cardiovascular: Normal rate, regular rhythm. Grossly normal heart sounds.  Good peripheral circulation. Respiratory: Normal respiratory effort.  No retractions. Lungs CTAB. Gastrointestinal: Soft and nontender. No distention. No abdominal bruits. No CVA tenderness. Genitourinary: Deferred Musculoskeletal: No lower extremity tenderness nor edema.  No joint effusions. Neurologic:  Normal speech and language. No gross focal neurologic deficits are appreciated. No gait instability. Skin:  Skin is warm, dry and intact. No rash noted. Psychiatric: Mood and affect are normal. Speech and behavior are normal.  ____________________________________________   LABS  ____0 Result Notes           Component Ref Range & Units 7 d  ago (09/10/21) 11 mo ago (10/04/20) 1 yr ago (07/18/20) 1 yr ago (07/08/20) 1 yr ago (07/08/20) 1 yr ago (06/29/20) 2 yr ago (09/06/19)  Glucose 70 - 99 mg/dL 84  60 Low  R   110 High  CM    98 R   Uric Acid 3.8 - 8.4 mg/dL 7.3  7.5 CM      6.7 CM   Comment:            Therapeutic target for gout patients: <6.0  BUN 6 - 24 mg/dL '18  13   20 ' R    16   Creatinine, Ser 0.76 - 1.27 mg/dL 1.01  1.03   1.13 R    1.04   eGFR >59 mL/min/1.73 90  88        BUN/Creatinine Ratio 9 - '20 18  13      15   ' Sodium 134 - 144 mmol/L 139  141   138 R    140   Potassium 3.5 - 5.2 mmol/L 4.7  5.0   3.9 R    4.5   Chloride 96 - 106 mmol/L 102  102  106 R    105   Calcium 8.7 - 10.2 mg/dL 9.5  9.3   9.0 R    9.2   Phosphorus 2.8 - 4.1 mg/dL 2.4 Low   3.0      2.3 Low    Total Protein 6.0 - 8.5 g/dL 6.8  6.8      6.8   Albumin 3.8 - 4.9 g/dL 4.2  4.1 R      4.4 R   Globulin, Total 1.5 - 4.5 g/dL 2.6  2.7      2.4   Albumin/Globulin Ratio 1.2 - 2.2 1.6  1.5      1.8   Bilirubin Total 0.0 - 1.2 mg/dL 0.7  0.5      0.6   Alkaline Phosphatase 44 - 121 IU/L 71  77      69 R   LDH 121 - 224 IU/L 225 High   311 High       204   AST 0 - 40 IU/L 36  41 High       34   ALT 0 - 44 IU/L 49 High   49 High       41   GGT 0 - 65 IU/L '23  25      20   ' Iron 38 - 169 ug/dL 88  58      96   Cholesterol, Total 100 - 199 mg/dL 121  109      123   Triglycerides 0 - 149 mg/dL 85  80      70   HDL >39 mg/dL 33 Low   30 Low       36 Low    VLDL Cholesterol Cal 5 - 40 mg/dL '17  16      14   ' LDL Chol Calc (NIH) 0 - 99 mg/dL 71  63      73   Chol/HDL Ratio 0.0 - 5.0 ratio 3.7  3.6 CM      3.4 CM   Comment:                                   T. Chol/HDL Ratio                                              Men  Women                                1/2 Avg.Risk  3.4    3.3                                    Avg.Risk  5.0    4.4                                 2X Avg.Risk  9.6    7.1                                 3X Avg.Risk 23.4    11.0   Estimated CHD Risk 0.0 -  1.0 times avg. 0.6  0.6 CM      0.5 CM   Comment: The CHD Risk is based on the T. Chol/HDL ratio. Other  factors affect CHD Risk such as hypertension, smoking,  diabetes, severe obesity, and family history of  premature CHD.   TSH 0.450 - 4.500 uIU/mL 2.860  2.540      2.560   T4, Total 4.5 - 12.0 ug/dL 10.5  10.2      10.0   T3 Uptake Ratio 24 - 39 % 29  32      29   Free Thyroxine Index 1.2 - 4.9 3.0  3.3      2.9   Prostate Specific Ag, Serum 0.0 - 4.0 ng/mL 0.2  0.3 CM      0.5 CM   Comment: Roche ECLIA methodology.  According to the American Urological Association, Serum PSA should  decrease and remain at undetectable levels after radical  prostatectomy. The AUA defines biochemical recurrence as an initial  PSA value 0.2 ng/mL or greater followed by a subsequent confirmatory  PSA value 0.2 ng/mL or greater.  Values obtained with different assay methods or kits cannot be used  interchangeably. Results cannot be interpreted as absolute evidence  of the presence or absence of malignant disease.   WBC 3.4 - 10.8 x10E3/uL 8.0  10.9 High     16.5 High  R   7.5   RBC 4.14 - 5.80 x10E6/uL 5.90 High   5.67  0-2 R   5.78 R  0-2 R  5.81 High    Hemoglobin 13.0 - 17.7 g/dL 16.2  15.7    15.9 R   16.3   Hematocrit 37.5 - 51.0 % 48.2  48.7    48.5 R   48.4   MCV 79 - 97 fL 82  86    83.9 R   83   MCH 26.6 - 33.0 pg 27.5  27.7    27.5 R   28.1   MCHC 31.5 - 35.7 g/dL 33.6  32.2    32.8 R   33.7   RDW 11.6 - 15.4 % 13.8  14.2    14.8 R   13.9   Platelets 150 - 450 x10E3/uL 350  335    409 High  R   349   Neutrophils Not Estab. % 67  70      70   Lymphs Not Estab. % '22  19      22   ' Monocytes Not Estab. % '8  7      6   ' Eos Not Estab. % '2  2      1   ' Basos Not Estab. % 1  1      0   Neutrophils Absolute 1.4 - 7.0 x10E3/uL 5.4  7.7 High       5.3   Lymphocytes Absolute 0.7 - 3.1 x10E3/uL 1.7  2.1      1.6   Monocytes Absolute 0.1 - 0.9 x10E3/uL 0.6  0.7      0.4    EOS (ABSOLUTE) 0.0 - 0.4 x10E3/uL 0.2  0.2      0.1   Basophils Absolute 0.0 - 0.2 x10E3/uL 0.0  0.1      0.0   Immature Granulocytes Not Estab. % 0  1      1   Immature Grans (Abs) 0.0 - 0.1 x10E3/uL 0.0  0.1      0.0   Resulting Agency  [facility]       Narrative Performed by: Maryan Puls Performed at:  [facility]  [address] [place], [place]  [id number]  Lab Director: Rush Farmer MD, Phone:  [phone]    Specimen Collected: 09/10/21 08:05 Last Resulted: 09/16/21 08:09      Lab Flowsheet    Order Details    View Encounter    Lab and Collection Details    Routing    Result History    View Encounter Conversation      CM=Additional comments  R=Reference range differs from displayed range      Result Care Coordination   Patient Communication   Add Comments   Add Notifications  Back to Top       Other Results from 09/10/2021   Contains abnormal data Testosterone,Free and Total Order: [id number] Status: Final result    Visible to patient: Yes (not seen)    Next appt: None    Dx: Encounter for physical examination    0 Result Notes           Component Ref Range & Units 7 d ago (09/10/21) 9 mo ago (12/11/20) 11 mo ago (10/04/20) 1 yr ago (05/11/20) 2 yr ago (06/15/19) 2 yr ago (03/14/19) 2 yr ago (02/09/19)  Testosterone 264 - 916 ng/dL 128 Low   325 CM  487 CM  387 CM  513 CM  806 CM  733 CM   Comment: Adult male reference interval is based on a population of  healthy nonobese males (BMI <30) between 86 and 12 years old.  [place], [place] [phone]. PMID: [id number].               ________________________________________  EKG  Sinus  Rhythm at 76 bpm -Right bundle branch block.  Asymptomatic and no change from previous exam  ABNORMAL   ____________________________________________    ____________________________________________   INITIAL IMPRESSION / ASSESSMENT AND PLAN   As part of my medical decision making, I reviewed the following data within the [facility]      Discussed lab and EKG results with patient.  Patient maintenance medicines were refilled today.  Advised to consider orthopedic or neurology appointment for cervical radiculopathy.     ____________________________________________   FINAL CLINICAL IMPRESSION  Well exam ED Discharge Orders          Ordered    econazole nitrate 1 % cream  2 times daily        09/17/21 1421    tamsulosin (FLOMAX) 0.4 MG CAPS capsule  Daily        09/17/21 1421    amphetamine-dextroamphetamine (ADDERALL XR) 20 MG 24 hr capsule  Daily        09/17/21 1421    methylPREDNISolone (MEDROL DOSEPAK) 4 MG TBPK tablet        09/17/21 1421             Note:  This document was prepared using Dragon voice recognition software and may include unintentional dictation errors.

## 2021-09-17 NOTE — Progress Notes (Signed)
Pt received testosterone injection. ?

## 2021-09-17 NOTE — Progress Notes (Signed)
Pt requesting his neck to be looked at.Jeffrey Carlson ? ?Pt requesting econazole nitrate cream,meloxicam, and tamsulosin and adderrall xr to be refilled. ?

## 2021-10-17 ENCOUNTER — Other Ambulatory Visit: Payer: Self-pay

## 2021-10-17 DIAGNOSIS — F988 Other specified behavioral and emotional disorders with onset usually occurring in childhood and adolescence: Secondary | ICD-10-CM

## 2021-10-17 MED ORDER — AMPHETAMINE-DEXTROAMPHET ER 20 MG PO CP24: 20.0000 mg | ORAL_CAPSULE | Freq: Every day | ORAL | 0 refills | Status: DC

## 2021-10-24 ENCOUNTER — Other Ambulatory Visit: Payer: Self-pay | Admitting: Physician Assistant

## 2021-10-24 DIAGNOSIS — M1710 Unilateral primary osteoarthritis, unspecified knee: Secondary | ICD-10-CM

## 2021-10-29 ENCOUNTER — Other Ambulatory Visit: Payer: Self-pay

## 2021-10-29 ENCOUNTER — Ambulatory Visit
Admission: RE | Admit: 2021-10-29 | Discharge: 2021-10-29 | Disposition: A | Discharge status: Home / Self Care | Payer: 59 | Source: Ambulatory Visit | Attending: Physician Assistant | Admitting: Physician Assistant

## 2021-10-29 ENCOUNTER — Ambulatory Visit: Payer: Self-pay | Admitting: Physician Assistant

## 2021-10-29 ENCOUNTER — Ambulatory Visit
Admission: RE | Admit: 2021-10-29 | Discharge: 2021-10-29 | Disposition: A | Discharge status: Home / Self Care | Payer: 59 | Attending: Physician Assistant | Admitting: Physician Assistant

## 2021-10-29 ENCOUNTER — Encounter: Payer: Self-pay | Admitting: Physician Assistant

## 2021-10-29 DIAGNOSIS — M5010 Cervical disc disorder with radiculopathy, unspecified cervical region: Secondary | ICD-10-CM

## 2021-10-29 DIAGNOSIS — M5412 Radiculopathy, cervical region: Secondary | ICD-10-CM | POA: Diagnosis not present

## 2021-10-29 MED ORDER — MELOXICAM 15 MG PO TABS: 15.0000 mg | ORAL_TABLET | Freq: Every day | ORAL | 6 refills | Status: DC

## 2021-10-29 NOTE — Progress Notes (Signed)
Pt presents today to follow up on neck pain. Pt stating the pain is back.  ?

## 2021-10-29 NOTE — Progress Notes (Signed)
? ?  Subjective: Neck pain  ? ? Patient ID: Jeffrey Carlson, male    DOB: 1971/04/16, 51 y.o.   MRN: [id number] ? ?HPI ?Patient complain of increasing numbness and pain to the neck with a radicular component to the upper extremities.  Onset of complaint approximately 1 year ago.  Mild transient relief with muscle relaxers and anti-inflammatory medications.  Patient states radiculopathy is alternating in the upper extremities.  Past medical history remarkable for osteoarthritis of the lower extremities.  Patient also has a history of acid reflux, hypertension, and hypogonadism. ? ? ?Review of Systems ?Negative except for chief complaint ?   ?Objective:  ? Physical Exam ? ?See nurses note for vital signs. ?Patient presents with neck in a slight flexed position.  No obvious deformity.  Free and equal range of motion.  Moderate guarding palpation C C4-C6.  Patient has full and equal range of motion of the upper extremities.  Strength against resistance of the upper extremities 5/5. ? ? ?   ?Assessment & Plan: Radicular neck pain  ?Differential diagnosis consist of pinched nerve, HNP, cervical strain.  Further evaluation with imaging and as necessary.  Patient will follow-up post imaging. ? ?

## 2021-10-31 NOTE — Addendum Note (Signed)
Addended by: Aliene Altes on: 10/31/2021 02:17 PM ? ? Modules accepted: Orders ? ?

## 2021-10-31 NOTE — Progress Notes (Signed)
Ortho referral for Dr. Thornton Park of [facility] faxed today to [phone]. ? ?AMD ?

## 2021-11-04 DIAGNOSIS — M5412 Radiculopathy, cervical region: Secondary | ICD-10-CM | POA: Diagnosis not present

## 2021-12-12 DIAGNOSIS — M5412 Radiculopathy, cervical region: Secondary | ICD-10-CM | POA: Diagnosis not present

## 2021-12-17 ENCOUNTER — Other Ambulatory Visit: Payer: Self-pay

## 2021-12-17 DIAGNOSIS — R102 Pelvic and perineal pain: Secondary | ICD-10-CM

## 2021-12-17 NOTE — Progress Notes (Signed)
1 day history of pain/pressure on left side radiating down to pelvis & mild burning with urination.  Denies back pain, frequency, urgency, incomplete emptying of bladder. Denies having done any recent activities (nothing out of the ordinary) that may have caused a strain.  Clean catch urine specimen analyzed in clinic -  Negative for Leukocytes & Nitrites.  No blood.    Advised to return for re-evaluation if s/sx continue or worsen  Reviewed above with Mardee Postin, PA-C  AMD

## 2021-12-17 NOTE — Telephone Encounter (Signed)
Mark called [facility] requesting Rx refill for Testosterone Cypionate.  Upon entering Rx refill info into Epic, a box opened up for pended Rx refill request for Testosterone Cypionate sent electronically to [facility] by patient's pharmacy.  Re-routed the Rx refill for Testosterone Cypionate to Mardee Postin, PA-C.  AMD

## 2021-12-19 ENCOUNTER — Other Ambulatory Visit: Payer: Self-pay

## 2021-12-19 DIAGNOSIS — F988 Other specified behavioral and emotional disorders with onset usually occurring in childhood and adolescence: Secondary | ICD-10-CM

## 2021-12-19 MED ORDER — AMPHETAMINE-DEXTROAMPHET ER 20 MG PO CP24: 20.0000 mg | ORAL_CAPSULE | Freq: Every day | ORAL | 0 refills | Status: DC

## 2021-12-20 DIAGNOSIS — M4722 Other spondylosis with radiculopathy, cervical region: Secondary | ICD-10-CM | POA: Diagnosis not present

## 2022-01-08 DIAGNOSIS — M5412 Radiculopathy, cervical region: Secondary | ICD-10-CM | POA: Diagnosis not present

## 2022-01-20 ENCOUNTER — Encounter: Payer: Self-pay | Admitting: Physician Assistant

## 2022-01-20 ENCOUNTER — Ambulatory Visit: Payer: Self-pay | Admitting: Physician Assistant

## 2022-01-20 ENCOUNTER — Other Ambulatory Visit: Payer: Self-pay

## 2022-01-20 DIAGNOSIS — L247 Irritant contact dermatitis due to plants, except food: Secondary | ICD-10-CM

## 2022-01-20 DIAGNOSIS — E291 Testicular hypofunction: Secondary | ICD-10-CM

## 2022-01-20 MED ORDER — METHYLPREDNISOLONE SODIUM SUCC 40 MG IJ SOLR
40.0000 mg | Freq: Once | INTRAMUSCULAR | Status: AC
  Administered 2022-01-20: 40 mg via INTRAMUSCULAR

## 2022-01-20 MED ORDER — METHYLPREDNISOLONE 4 MG PO TBPK: ORAL_TABLET | ORAL | 0 refills | Status: DC

## 2022-01-20 MED ORDER — HYDROXYZINE HCL 50 MG PO TABS: 50.0000 mg | ORAL_TABLET | Freq: Three times a day (TID) | ORAL | 0 refills | Status: DC | PRN

## 2022-01-20 MED ORDER — TESTOSTERONE CYPIONATE 200 MG/ML IM SOLN
100.0000 mg | INTRAMUSCULAR | Status: AC
  Administered 2022-01-20 – 2022-06-26 (×6): 100 mg via INTRAMUSCULAR

## 2022-01-20 NOTE — Progress Notes (Signed)
Solu-Medrol injection given as ordered as well as Testosterone injection as ordered.

## 2022-01-20 NOTE — Progress Notes (Signed)
Pt has poision ivy on left arm, some on face, right leg. Started yesterday after noon till this morning.

## 2022-01-20 NOTE — Progress Notes (Signed)
   Subjective: Contact dermatitis    Patient ID: Jeffrey Carlson, male    DOB: 13-Jun-1971, 51 y.o.   MRN: [id number]  HPI Patient presents with contact dermatitis with suspected exposure to poison oak/poison ivy 2 days ago.  States mild to moderate itching.  Lesions confined to the left upper extremity with a satellite lesion to the forehead.  No signs symptoms secondary infection.   Review of [place], hypertension, and and hypogonadism.    Objective:   Physical Exam  Vital signs not taken. Examination review vesicle lesions volar aspect of the right forearm.      Assessment & Plan: Contact dermatitis  Patient given Solu-Medrol 40 mg IM followed by Medrol Dosepak.  Patient advised to follow-up with orthopedics since he is scheduled for cervical steroid injection in 2 days.  Advised not to take the Medrol Dosepak the upcoming procedure.

## 2022-01-20 NOTE — Addendum Note (Signed)
Addended by: Aliene Altes on: 01/20/2022 04:05 PM   Modules accepted: Orders

## 2022-01-22 DIAGNOSIS — M5412 Radiculopathy, cervical region: Secondary | ICD-10-CM | POA: Diagnosis not present

## 2022-01-24 ENCOUNTER — Other Ambulatory Visit: Payer: Self-pay

## 2022-01-24 DIAGNOSIS — F988 Other specified behavioral and emotional disorders with onset usually occurring in childhood and adolescence: Secondary | ICD-10-CM

## 2022-01-24 MED ORDER — AMPHETAMINE-DEXTROAMPHET ER 20 MG PO CP24: 20.0000 mg | ORAL_CAPSULE | Freq: Every day | ORAL | 0 refills | Status: DC

## 2022-02-07 DIAGNOSIS — M5412 Radiculopathy, cervical region: Secondary | ICD-10-CM | POA: Diagnosis not present

## 2022-02-20 DIAGNOSIS — M5030 Other cervical disc degeneration, unspecified cervical region: Secondary | ICD-10-CM | POA: Diagnosis not present

## 2022-02-20 DIAGNOSIS — M5412 Radiculopathy, cervical region: Secondary | ICD-10-CM | POA: Diagnosis not present

## 2022-03-07 ENCOUNTER — Other Ambulatory Visit: Payer: Self-pay

## 2022-03-07 DIAGNOSIS — F988 Other specified behavioral and emotional disorders with onset usually occurring in childhood and adolescence: Secondary | ICD-10-CM

## 2022-03-09 MED ORDER — AMPHETAMINE-DEXTROAMPHET ER 20 MG PO CP24: 20.0000 mg | ORAL_CAPSULE | Freq: Every day | ORAL | 0 refills | Status: DC

## 2022-03-11 ENCOUNTER — Ambulatory Visit
Admission: RE | Admit: 2022-03-11 | Discharge: 2022-03-11 | Disposition: A | Discharge status: Home / Self Care | Payer: 59 | Source: Ambulatory Visit | Attending: Physician Assistant | Admitting: Physician Assistant

## 2022-03-11 ENCOUNTER — Encounter: Payer: Self-pay | Admitting: Physician Assistant

## 2022-03-11 ENCOUNTER — Ambulatory Visit
Admission: RE | Admit: 2022-03-11 | Discharge: 2022-03-11 | Disposition: A | Discharge status: Home / Self Care | Payer: 59 | Attending: Physician Assistant | Admitting: Physician Assistant

## 2022-03-11 ENCOUNTER — Ambulatory Visit: Payer: Self-pay | Admitting: Physician Assistant

## 2022-03-11 VITALS — BP 123/70 | HR 82 | Temp 97.5°F | Resp 12 | Wt 343.0 lb

## 2022-03-11 DIAGNOSIS — M5410 Radiculopathy, site unspecified: Secondary | ICD-10-CM

## 2022-03-11 DIAGNOSIS — M545 Low back pain, unspecified: Secondary | ICD-10-CM | POA: Diagnosis not present

## 2022-03-11 MED ORDER — ORPHENADRINE CITRATE ER 100 MG PO TB12: 100.0000 mg | ORAL_TABLET | Freq: Two times a day (BID) | ORAL | 0 refills | Status: AC

## 2022-03-11 NOTE — Progress Notes (Signed)
Picked up his mom from the floor on Sunday (03/02/22).  Pain didn't start immediately - but as the day progressed the pain started. Sitting & laying down in bed are worse than walking. Spasm on the left side - similar to what he experienced previously on the right side. Pain radiating down from low back into left hip & leg - all the way down to the foot. Toes numb & tingling - feel like hot water  Stretching, walking, Flexeril 5 mg. Already takes Meloxicam & Gabapentin Taking ibuprofen at night Has some done heat.  AMD

## 2022-03-11 NOTE — Progress Notes (Signed)
   Subjective: Radicular back pain    Patient ID: Jeffrey Carlson, male    DOB: 1971/06/16, 51 y.o.   MRN: [id number]  HPI Patient presents with radicular back pain secondary to heavy lifting incident which occurred 1 week ago.  Patient states mother fell out of bed and he went to assist her.  Patient states he lift mother off the floor and was waiting for his father to put a chair underneath her.  Patient stated father took too long and he felt a catch in his back.  Patient states he spent the night sitting in a chair at the emergency room wife's mother was evaluated admitted.  Patient states pain is worsening since the incident.  Patient states pain increased with sitting and lying down.  Decreased pain with ambulation.  Denies radicular component to the right lower extremity.  He describes as a burning/numbing sensation radiating to his toes.  Patient is currently taking meloxicam, gabapentin, and using heat applications.  Patient states only mild relief.  Denies bladder or bowel dysfunction.  Review her MRI with contrast taken on 05/11/2015 reveals mild to moderate DJD with Left L4 extruded disc and L5/S1  small central disc protrusion.    Review of Systems ADHD, GERD, hypertension, hypogonadism, and obesity.    Objective:   Physical Exam Mild distress.  Sits and stands reliance on upper extremities. BP is 123/70, pulse 82, respiration 12, temperature 97.5, and patient 1% O2 sat on room air.  Patient weighs 343 pounds and BMI is 46.52. Examination lumbar spine limited by body habitus.  No obvious deformity.  Decreased range of motion in all fields.  Negative straight leg test.       Assessment & Plan: Radicular back pain  Advised to continue previous medications.  Patient was given a prescription for Norflex.  Patient sent for outpatient imaging of the lumbar spine.  We will consider consult to physical therapy.

## 2022-03-12 NOTE — Addendum Note (Signed)
Addended by: Aliene Altes on: 03/12/2022 09:03 AM   Modules accepted: Orders

## 2022-03-14 DIAGNOSIS — M5416 Radiculopathy, lumbar region: Secondary | ICD-10-CM | POA: Diagnosis not present

## 2022-03-19 DIAGNOSIS — M5416 Radiculopathy, lumbar region: Secondary | ICD-10-CM | POA: Diagnosis not present

## 2022-03-21 DIAGNOSIS — M5416 Radiculopathy, lumbar region: Secondary | ICD-10-CM | POA: Diagnosis not present

## 2022-03-24 DIAGNOSIS — M5416 Radiculopathy, lumbar region: Secondary | ICD-10-CM | POA: Diagnosis not present

## 2022-03-28 DIAGNOSIS — M5416 Radiculopathy, lumbar region: Secondary | ICD-10-CM | POA: Diagnosis not present

## 2022-03-31 DIAGNOSIS — M5416 Radiculopathy, lumbar region: Secondary | ICD-10-CM | POA: Diagnosis not present

## 2022-04-03 ENCOUNTER — Ambulatory Visit: Payer: Self-pay | Admitting: Physician Assistant

## 2022-04-03 ENCOUNTER — Encounter: Payer: Self-pay | Admitting: Physician Assistant

## 2022-04-03 VITALS — BP 140/86 | HR 95

## 2022-04-03 DIAGNOSIS — M5416 Radiculopathy, lumbar region: Secondary | ICD-10-CM | POA: Diagnosis not present

## 2022-04-03 DIAGNOSIS — M5410 Radiculopathy, site unspecified: Secondary | ICD-10-CM

## 2022-04-03 NOTE — Progress Notes (Signed)
Pt presents today for follow up back pain.  Pt stating the spasms are getting better but still having the burning sensation going down both legs.

## 2022-04-03 NOTE — Progress Notes (Signed)
[facility]   ____________________________________________   None    (approximate)  I have reviewed the triage vital signs and the nursing notes.   HISTORY  Chief Complaint follow up    HPI Jeffrey Carlson is a 51 y.o. male patient reports for reevaluation of acute back pain which started a month ago.  Patient was diagnosed with severe DJD started with bilateral radiculopathy.  Patient had also muscle spasms.  Patient was given Norflex muscle relaxers and sent to physical therapy.  Patient states muscle relaxers resulted in muscle spasm continue to have radicular component worsening with physical therapy.  Patient denies bladder or bowel dysfunction.     Past Medical History:  Diagnosis Date   Adult ADHD    Arthritis    Barrett esophagus    Colon polyps    Degenerative joint disease    GERD (gastroesophageal reflux disease)    Hiatal hernia    Hypertension    dr Burt Ek   [place]   Leukocytosis    Obesity    Polycythemia, secondary 12/20/2014   Sleep apnea    cpap   Testosterone deficiency    Undescended right testicle     Patient Active Problem List   Diagnosis Date Noted   Hypophosphatemia 09/26/2019   Iron deficiency anemia 02/15/2019   Hypogonadism in male 02/15/2019   Essential hypertension 02/15/2019   Gastroesophageal reflux disease 02/15/2019   History of colonic polyps 02/15/2019   Attention deficit hyperactivity disorder (ADHD) 02/15/2019   Obesity, Class III, BMI 40-49.9 (morbid obesity) ([place]) 07/19/2018   Osteoarthritis of knee 09/08/2017   Atypical chest pain 01/18/2015   Bundle branch block, right 01/18/2015   Breath shortness 01/18/2015   Polycythemia, secondary 12/20/2014   Rotator cuff tear 10/10/2013    Past Surgical History:  Procedure Laterality Date   APPENDECTOMY     BACK SURGERY     CHOLECYSTECTOMY     COLONOSCOPY  06/2010   + small bowel capsule study   EYE SURGERY     childhood    KNEE SURGERY     ROTATOR CUFF REPAIR     rt shoulder    Prior to Admission medications   Medication Sig Start Date End Date Taking? Authorizing Provider  amphetamine-dextroamphetamine (ADDERALL XR) 20 MG 24 hr capsule Take 1 capsule (20 mg total) by mouth daily. 03/09/22   Sable Feil, PA-C  clotrimazole-betamethasone (LOTRISONE) cream Apply 1 application topically 2 (two) times daily. 08/10/20   Edrick Kins, DPM  cyclobenzaprine (FLEXERIL) 5 MG tablet Take 1 tablet (5 mg total) by mouth 3 (three) times daily as needed for muscle spasms. 11/20/20   Apolonio Schneiders, FNP  econazole nitrate 1 % cream Apply topically 2 (two) times daily. 09/17/21   Sable Feil, PA-C  esomeprazole (NEXIUM) 40 MG capsule Take 40 mg by mouth daily at 12 noon.    [provider]  fexofenadine-pseudoephedrine (ALLEGRA-D) 60-120 MG 12 hr tablet Take 1 tablet by mouth 2 (two) times daily. Patient not taking: Reported on 03/11/2022 07/02/21   Sable Feil, PA-C  gabapentin (NEURONTIN) 300 MG capsule Take by mouth. 12/10/21   [provider]  lisinopril (ZESTRIL) 20 MG tablet Take 1 tablet (20 mg total) by mouth daily. 04/04/21   Sable Feil, PA-C  meloxicam (MOBIC) 15 MG tablet Take 1 tablet (15 mg total) by mouth daily. 10/29/21   Sable Feil, PA-C  Multiple Vitamins-Minerals (MULTIVITAMIN ADULTS PO) Take 1 tablet  by mouth daily at 6 PM.    [provider]  orphenadrine (NORFLEX) 100 MG tablet Take 1 tablet (100 mg total) by mouth 2 (two) times daily. 03/11/22   Sable Feil, PA-C  tamsulosin (FLOMAX) 0.4 MG CAPS capsule Take 1 capsule (0.4 mg total) by mouth daily. 09/17/21   Sable Feil, PA-C  testosterone cypionate (DEPOTESTOSTERONE CYPIONATE) 200 MG/ML injection INJECT 0.5 MLS (100 MG TOTAL) INTO THE MUSCLE EVERY 14 (FOURTEEN) DAYS. 12/17/21   Sable Feil, PA-C    Allergies Patient has no known allergies.  Family History  Problem Relation Age of Onset   Thyroid cancer  Father    Rheum arthritis Mother    Abnormal EKG Mother    Colon cancer Paternal Grandmother     Social History Social History   Tobacco Use   Smoking status: Never   Smokeless tobacco: Former    Types: Nurse, children's Use: Never used  Substance Use Topics   Alcohol use: Yes    Alcohol/week: 0.0 standard drinks of alcohol    Comment: occ   Drug use: No    Review of Systems Constitutional: No fever/chills Eyes: No visual changes. ENT: No sore throat. Cardiovascular: Denies chest pain. Respiratory: Denies shortness of breath. Gastrointestinal: No abdominal pain.  No nausea, no vomiting.  No diarrhea.  No constipation. Genitourinary: Negative for dysuria. Musculoskeletal: Positive for radicular back pain. Skin: Negative for rash. Neurological: Negative for headaches, focal weakness or numbness.   Psychiatric:  Endocrine: Hypertension, hypogonadism, and GERD. Hematological/Lymphatic: Iron deficiency anemia   ____________________________________________   PHYSICAL EXAM:  VITAL SIGNS: BP is 146/80, pulse 95, and patient 90% O2 sat on room air.   Constitutional: Alert and oriented. Well appearing and in no acute distress.  Morbid obesity Eyes: Conjunctivae are normal. PERRL. EOMI. Head: Atraumatic. Nose: No congestion/rhinnorhea. Mouth/Throat: Mucous membranes are moist.  Oropharynx non-erythematous. Neck: No stridor.  No cervical spine tenderness to palpation. Hematological/Lymphatic/Immunilogical: No cervical lymphadenopathy. Cardiovascular: Normal rate, regular rhythm. Grossly normal heart sounds.  Good peripheral circulation. Respiratory: Normal respiratory effort.  No retractions. Lungs CTAB. Gastrointestinal: Soft and nontender.  Distention secondary to body habitus. No abdominal bruits. No CVA tenderness. Genitourinary: Deferred Musculoskeletal: Exam of the lumbar spine is limited secondary to body habitus.  No lower extremity tenderness nor edema.  No  joint effusions.  Patient has negative bilateral straight leg test. Neurologic:  Normal speech and language. No gross focal neurologic deficits are appreciated. No gait instability. Skin:  Skin is warm, dry and intact. No rash noted. Psychiatric: Mood and affect are normal. Speech and behavior are normal.  ____________________________________________   ____________________________________________  RADIOLOGY Cecilio Asper, personally viewed and evaluated these images (plain radiographs) as part of my medical decision making, as well as reviewing the written report by the radiologist.   interpretation: Degenerative changes lumbar spine at L4-S1.  Official radiology report(s): No results found. Narrative & Impression  CLINICAL DATA:  Acute low back pain.   EXAM: LUMBAR SPINE - COMPLETE 4+ VIEW   COMPARISON:  None Available.   FINDINGS: There is no evidence of lumbar spine fracture. Mild scoliosis of spine. Moderate to severe degenerative joint changes are identified at the L4-S1 level with narrowed joint space and osteophyte formation. Mild anterior spurring is identified throughout lumbar spine. Moderate degenerative joint changes of lower thoracic spine with narrowed joint space and osteophyte formation are noted.   IMPRESSION: Degenerative joint changes of lumbar spine most  prominent at the L4-S1 level.     Electronically Signed   By: Abelardo Diesel M.D.     ____________________________________________   Procedures   ____________________________________________   INITIAL IMPRESSION / ASSESSMENT AND PLAN  As part of my medical decision making, I reviewed the following data within the electronic MEDICAL RECORD NUMBER       Moderate to acute low back pain secondary to degenerative joint disease with a radicular component to the bilateral lower extremity.  Patient given a TENS unit and advised to continue muscle relaxers as needed.  Patient will follow-up on  04/07/2022.  A consult to neurosurgeon will be generated.     ____________________________________________   FINAL CLINICAL IMPRESSION(S) / ED DIAGNOSES  Acute low back pain   ED Discharge Orders     None        Note:  This document was prepared using Dragon voice recognition software and may include unintentional dictation errors.

## 2022-04-03 NOTE — Addendum Note (Signed)
Addended by: Aliene Altes on: 04/03/2022 02:46 PM   Modules accepted: Orders

## 2022-04-10 DIAGNOSIS — M5416 Radiculopathy, lumbar region: Secondary | ICD-10-CM | POA: Diagnosis not present

## 2022-04-10 DIAGNOSIS — M5137 Other intervertebral disc degeneration, lumbosacral region: Secondary | ICD-10-CM | POA: Diagnosis not present

## 2022-04-10 DIAGNOSIS — M5412 Radiculopathy, cervical region: Secondary | ICD-10-CM | POA: Diagnosis not present

## 2022-04-16 ENCOUNTER — Other Ambulatory Visit: Payer: Self-pay | Admitting: Neurosurgery

## 2022-04-16 DIAGNOSIS — M5137 Other intervertebral disc degeneration, lumbosacral region: Secondary | ICD-10-CM

## 2022-04-29 ENCOUNTER — Other Ambulatory Visit: Payer: Self-pay

## 2022-04-29 ENCOUNTER — Ambulatory Visit: Payer: Self-pay

## 2022-04-29 DIAGNOSIS — E291 Testicular hypofunction: Secondary | ICD-10-CM

## 2022-04-29 DIAGNOSIS — M5010 Cervical disc disorder with radiculopathy, unspecified cervical region: Secondary | ICD-10-CM

## 2022-04-29 DIAGNOSIS — F988 Other specified behavioral and emotional disorders with onset usually occurring in childhood and adolescence: Secondary | ICD-10-CM

## 2022-04-29 MED ORDER — AMPHETAMINE-DEXTROAMPHET ER 20 MG PO CP24: 20.0000 mg | ORAL_CAPSULE | Freq: Every day | ORAL | 0 refills | Status: DC

## 2022-04-29 NOTE — Progress Notes (Signed)
Pt received bi-weekly injection. 

## 2022-04-29 NOTE — Addendum Note (Signed)
Addended by: Raenette Rover, Barba Solt M on: 04/29/2022 09:00 AM   Modules accepted: Orders

## 2022-05-02 DIAGNOSIS — M48061 Spinal stenosis, lumbar region without neurogenic claudication: Secondary | ICD-10-CM | POA: Diagnosis not present

## 2022-05-02 DIAGNOSIS — Z9889 Other specified postprocedural states: Secondary | ICD-10-CM | POA: Diagnosis not present

## 2022-05-02 DIAGNOSIS — M5126 Other intervertebral disc displacement, lumbar region: Secondary | ICD-10-CM | POA: Diagnosis not present

## 2022-05-06 DIAGNOSIS — M5412 Radiculopathy, cervical region: Secondary | ICD-10-CM | POA: Diagnosis not present

## 2022-05-06 DIAGNOSIS — M5416 Radiculopathy, lumbar region: Secondary | ICD-10-CM | POA: Diagnosis not present

## 2022-05-19 DIAGNOSIS — M5412 Radiculopathy, cervical region: Secondary | ICD-10-CM | POA: Diagnosis not present

## 2022-05-19 DIAGNOSIS — M5416 Radiculopathy, lumbar region: Secondary | ICD-10-CM | POA: Diagnosis not present

## 2022-06-03 ENCOUNTER — Other Ambulatory Visit: Payer: Self-pay

## 2022-06-03 DIAGNOSIS — F988 Other specified behavioral and emotional disorders with onset usually occurring in childhood and adolescence: Secondary | ICD-10-CM

## 2022-06-04 ENCOUNTER — Ambulatory Visit: Payer: Self-pay

## 2022-06-04 DIAGNOSIS — Z23 Encounter for immunization: Secondary | ICD-10-CM

## 2022-06-04 MED ORDER — AMPHETAMINE-DEXTROAMPHET ER 20 MG PO CP24: 20.0000 mg | ORAL_CAPSULE | Freq: Every day | ORAL | 0 refills | Status: DC

## 2022-06-04 NOTE — Progress Notes (Signed)
Pt received bi weekly testosterone injection and influenza vaccine.

## 2022-06-08 ENCOUNTER — Other Ambulatory Visit: Payer: Self-pay | Admitting: Physician Assistant

## 2022-06-09 DIAGNOSIS — M5412 Radiculopathy, cervical region: Secondary | ICD-10-CM | POA: Diagnosis not present

## 2022-06-16 ENCOUNTER — Other Ambulatory Visit: Payer: Self-pay | Admitting: Physician Assistant

## 2022-06-16 DIAGNOSIS — I10 Essential (primary) hypertension: Secondary | ICD-10-CM

## 2022-06-26 ENCOUNTER — Other Ambulatory Visit: Payer: Self-pay

## 2022-06-26 ENCOUNTER — Ambulatory Visit: Payer: Self-pay | Admitting: Physician Assistant

## 2022-06-26 ENCOUNTER — Encounter: Payer: Self-pay | Admitting: Physician Assistant

## 2022-06-26 DIAGNOSIS — M5416 Radiculopathy, lumbar region: Secondary | ICD-10-CM

## 2022-06-26 DIAGNOSIS — M5412 Radiculopathy, cervical region: Secondary | ICD-10-CM | POA: Diagnosis not present

## 2022-06-26 DIAGNOSIS — Z6841 Body Mass Index (BMI) 40.0 and over, adult: Secondary | ICD-10-CM | POA: Diagnosis not present

## 2022-06-26 DIAGNOSIS — M5010 Cervical disc disorder with radiculopathy, unspecified cervical region: Secondary | ICD-10-CM

## 2022-06-26 DIAGNOSIS — B351 Tinea unguium: Secondary | ICD-10-CM

## 2022-06-26 DIAGNOSIS — M1990 Unspecified osteoarthritis, unspecified site: Secondary | ICD-10-CM

## 2022-06-26 DIAGNOSIS — B353 Tinea pedis: Secondary | ICD-10-CM

## 2022-06-26 DIAGNOSIS — M179 Osteoarthritis of knee, unspecified: Secondary | ICD-10-CM

## 2022-06-26 MED ORDER — TERBINAFINE HCL 250 MG PO TABS: 250.0000 mg | ORAL_TABLET | Freq: Every day | ORAL | 0 refills | Status: DC

## 2022-06-26 MED ORDER — MELOXICAM 15 MG PO TABS: 15.0000 mg | ORAL_TABLET | Freq: Every day | ORAL | 6 refills | Status: DC

## 2022-06-26 NOTE — Progress Notes (Signed)
   Subjective: Foot infection    Patient ID: Jeffrey Carlson, male    DOB: Nov 07, 1970, 51 y.o.   MRN: [id number]  HPI Patient complain of toenail and foot infection for approximately 3 months.  Patient states similar complaint 5 years ago and was treated with an antifungal pill.   Review of Systems Right bundle branch block, GERD, hypertension, obesity, and tinea pedis.    Objective:   Physical Exam Morbid obesity. Examination of the the bilateral foot reveals hypertrophic nails.  Erythematous macular lesion with signs of excoriation.  No signs of secondary infection.  Neurovascular intact.      Assessment & Plan: onychomcosis and tinea pedis  Patient given a prescription for Lamisil 250 mg tablets to take daily.  Patient will follow-up in 3 months for reevaluation and have a liver function test.

## 2022-06-30 ENCOUNTER — Ambulatory Visit: Payer: Self-pay | Admitting: Physician Assistant

## 2022-06-30 ENCOUNTER — Encounter: Payer: Self-pay | Admitting: Physician Assistant

## 2022-06-30 DIAGNOSIS — J0100 Acute maxillary sinusitis, unspecified: Secondary | ICD-10-CM

## 2022-06-30 MED ORDER — FEXOFENADINE-PSEUDOEPHED ER 60-120 MG PO TB12: 1.0000 | ORAL_TABLET | Freq: Two times a day (BID) | ORAL | 0 refills | Status: DC

## 2022-06-30 MED ORDER — AMOXICILLIN 875 MG PO TABS: 875.0000 mg | ORAL_TABLET | Freq: Two times a day (BID) | ORAL | 0 refills | Status: AC

## 2022-06-30 NOTE — Progress Notes (Signed)
   Subjective: Sinus congestion    Patient ID: Jeffrey Carlson, male    DOB: 28-Aug-1970, 51 y.o.   MRN: [id number]  HPI Patient complains of 4 weeks of sinus congestion, ear pressure, and intermittent rhinorrhea.  Patient states at times there is mild hearing loss with complaint.  Denies recent travel or known contact with COVID-19.  No fevers or loss of taste or smell.   Review of Systems GERD, hypertension, and hypogonadism    Objective:   Physical Exam  This is a telephonic encounter      Assessment & Plan: Sinusitis   Patient given prescription for amoxicillin and Allegra-D.  Advised to follow-up in 5 days if no improvement or worsening complaint.

## 2022-07-02 DIAGNOSIS — M5412 Radiculopathy, cervical region: Secondary | ICD-10-CM | POA: Diagnosis not present

## 2022-07-03 ENCOUNTER — Other Ambulatory Visit: Payer: 59

## 2022-07-04 ENCOUNTER — Other Ambulatory Visit: Payer: 59

## 2022-07-04 DIAGNOSIS — E291 Testicular hypofunction: Secondary | ICD-10-CM

## 2022-07-04 NOTE — Progress Notes (Signed)
Pt presents today to complete testosterone panel.

## 2022-07-16 LAB — TESTOSTERONE,FREE AND TOTAL
Testosterone, Free: 13.3 pg/mL (ref 7.2–24.0)
Testosterone: 596 ng/dL (ref 264–916)

## 2022-07-17 ENCOUNTER — Other Ambulatory Visit: Payer: Self-pay

## 2022-07-17 DIAGNOSIS — F988 Other specified behavioral and emotional disorders with onset usually occurring in childhood and adolescence: Secondary | ICD-10-CM

## 2022-07-17 DIAGNOSIS — E291 Testicular hypofunction: Secondary | ICD-10-CM

## 2022-07-17 DIAGNOSIS — I10 Essential (primary) hypertension: Secondary | ICD-10-CM

## 2022-07-17 MED ORDER — TESTOSTERONE CYPIONATE 200 MG/ML IM SOLN: 100.0000 mg | INTRAMUSCULAR | 1 refills | Status: AC

## 2022-07-17 MED ORDER — LISINOPRIL 20 MG PO TABS: 20.0000 mg | ORAL_TABLET | Freq: Every day | ORAL | 3 refills | Status: DC

## 2022-07-17 MED ORDER — AMPHETAMINE-DEXTROAMPHET ER 20 MG PO CP24: 20.0000 mg | ORAL_CAPSULE | Freq: Every day | ORAL | 0 refills | Status: DC

## 2022-08-11 ENCOUNTER — Ambulatory Visit: Payer: Self-pay

## 2022-08-11 DIAGNOSIS — E291 Testicular hypofunction: Secondary | ICD-10-CM

## 2022-08-11 DIAGNOSIS — Z0000 Encounter for general adult medical examination without abnormal findings: Secondary | ICD-10-CM

## 2022-08-11 LAB — POCT URINALYSIS DIPSTICK
Bilirubin, UA: NEGATIVE
Blood, UA: NEGATIVE
Glucose, UA: NEGATIVE
Ketones, UA: NEGATIVE
Leukocytes, UA: NEGATIVE
Nitrite, UA: NEGATIVE
Protein, UA: NEGATIVE
Spec Grav, UA: 1.025 (ref 1.010–1.025)
Urobilinogen, UA: 0.2 E.U./dL
pH, UA: 6 (ref 5.0–8.0)

## 2022-08-11 MED ORDER — TESTOSTERONE CYPIONATE 200 MG/ML IM SOLN
100.0000 mg | INTRAMUSCULAR | Status: AC
  Administered 2022-08-11 – 2023-01-12 (×5): 100 mg via INTRAMUSCULAR

## 2022-08-11 NOTE — Progress Notes (Signed)
Pt presents today to complete lab portion of sceduled physical.  

## 2022-08-12 ENCOUNTER — Encounter: Payer: Self-pay | Admitting: Physician Assistant

## 2022-08-12 LAB — CMP12+LP+TP+TSH+6AC+PSA+CBC…
ALT: 41 IU/L (ref 0–44)
AST: 33 IU/L (ref 0–40)
Albumin/Globulin Ratio: 1.6 (ref 1.2–2.2)
Albumin: 4.1 g/dL (ref 3.8–4.9)
Alkaline Phosphatase: 66 IU/L (ref 44–121)
BUN/Creatinine Ratio: 18 (ref 9–20)
BUN: 19 mg/dL (ref 6–24)
Basophils Absolute: 0.1 10*3/uL (ref 0.0–0.2)
Basos: 1 %
Bilirubin Total: 0.5 mg/dL (ref 0.0–1.2)
Calcium: 9.6 mg/dL (ref 8.7–10.2)
Chloride: 102 mmol/L (ref 96–106)
Chol/HDL Ratio: 3.6 ratio (ref 0.0–5.0)
Cholesterol, Total: 120 mg/dL (ref 100–199)
Creatinine, Ser: 1.08 mg/dL (ref 0.76–1.27)
EOS (ABSOLUTE): 0.2 10*3/uL (ref 0.0–0.4)
Eos: 3 %
Estimated CHD Risk: 0.6 times avg. (ref 0.0–1.0)
Free Thyroxine Index: 2.6 (ref 1.2–4.9)
GGT: 27 IU/L (ref 0–65)
Globulin, Total: 2.6 g/dL (ref 1.5–4.5)
Glucose: 93 mg/dL (ref 70–99)
HDL: 33 mg/dL — ABNORMAL LOW (ref >39)
Hematocrit: 48.9 % (ref 37.5–51.0)
Hemoglobin: 16.4 g/dL (ref 13.0–17.7)
Immature Grans (Abs): 0.1 10*3/uL (ref 0.0–0.1)
Immature Granulocytes: 1 %
Iron: 89 ug/dL (ref 38–169)
LDH: 195 IU/L (ref 121–224)
LDL Chol Calc (NIH): 68 mg/dL (ref 0–99)
Lymphocytes Absolute: 2 10*3/uL (ref 0.7–3.1)
Lymphs: 23 %
MCH: 27.4 pg (ref 26.6–33.0)
MCHC: 33.5 g/dL (ref 31.5–35.7)
MCV: 82 fL (ref 79–97)
Monocytes Absolute: 0.6 10*3/uL (ref 0.1–0.9)
Monocytes: 7 %
Neutrophils Absolute: 5.7 10*3/uL (ref 1.4–7.0)
Neutrophils: 65 %
Phosphorus: 3.3 mg/dL (ref 2.8–4.1)
Platelets: 345 10*3/uL (ref 150–450)
Potassium: 4.6 mmol/L (ref 3.5–5.2)
Prostate Specific Ag, Serum: 0.2 ng/mL (ref 0.0–4.0)
RBC: 5.98 x10E6/uL — ABNORMAL HIGH (ref 4.14–5.80)
RDW: 14.4 % (ref 11.6–15.4)
Sodium: 139 mmol/L (ref 134–144)
T3 Uptake Ratio: 29 % (ref 24–39)
T4, Total: 8.9 ug/dL (ref 4.5–12.0)
TSH: 2.44 u[IU]/mL (ref 0.450–4.500)
Total Protein: 6.7 g/dL (ref 6.0–8.5)
Triglycerides: 101 mg/dL (ref 0–149)
Uric Acid: 7 mg/dL (ref 3.8–8.4)
VLDL Cholesterol Cal: 19 mg/dL (ref 5–40)
WBC: 8.7 10*3/uL (ref 3.4–10.8)
eGFR: 83 mL/min/{1.73_m2} (ref >59)

## 2022-08-14 ENCOUNTER — Ambulatory Visit: Payer: Self-pay | Admitting: Physician Assistant

## 2022-08-14 ENCOUNTER — Encounter: Payer: Self-pay | Admitting: Physician Assistant

## 2022-08-14 ENCOUNTER — Other Ambulatory Visit: Payer: Self-pay

## 2022-08-14 VITALS — BP 124/82 | HR 93 | Temp 98.5°F | Resp 12 | Ht 73.0 in | Wt 353.0 lb

## 2022-08-14 DIAGNOSIS — F988 Other specified behavioral and emotional disorders with onset usually occurring in childhood and adolescence: Secondary | ICD-10-CM

## 2022-08-14 DIAGNOSIS — Z1211 Encounter for screening for malignant neoplasm of colon: Secondary | ICD-10-CM

## 2022-08-14 DIAGNOSIS — G8929 Other chronic pain: Secondary | ICD-10-CM

## 2022-08-14 DIAGNOSIS — Z0000 Encounter for general adult medical examination without abnormal findings: Secondary | ICD-10-CM

## 2022-08-14 MED ORDER — AMPHETAMINE-DEXTROAMPHET ER 20 MG PO CP24: 20.0000 mg | ORAL_CAPSULE | Freq: Every day | ORAL | 0 refills | Status: DC

## 2022-08-14 NOTE — Progress Notes (Signed)
[facility]  ____________________________________________   None    (approximate)  I have reviewed the triage vital signs and the nursing notes.   HISTORY  Chief Complaint Annual Exam   HPI Jeffrey Carlson is a 52 y.o. male patient presents for annual physical exam.  Patient was concern for chronic shoulder pain at the [facility] joint.  Patient stated cannot sleep on left side secondary to pain.  Denies loss sensation or loss of function.  Patient is right-hand dominant.  Patient had history of right rotator cuff repair secondary to a tear in 2014.         Past Medical History:  Diagnosis Date   Adult ADHD    Arthritis    Barrett esophagus    Colon polyps    Degenerative joint disease    GERD (gastroesophageal reflux disease)    Hiatal hernia    Hypertension    dr Burt Ek   [place]   Leukocytosis    Obesity    Polycythemia, secondary 12/20/2014   Sleep apnea    cpap   Testosterone deficiency    Undescended right testicle     Patient Active Problem List   Diagnosis Date Noted   Hypophosphatemia 09/26/2019   Iron deficiency anemia 02/15/2019   Hypogonadism in male 02/15/2019   Essential hypertension 02/15/2019   Gastroesophageal reflux disease 02/15/2019   History of colonic polyps 02/15/2019   Attention deficit hyperactivity disorder (ADHD) 02/15/2019   Obesity, Class III, BMI 40-49.9 (morbid obesity) ([place]) 07/19/2018   Osteoarthritis of knee 09/08/2017   Degeneration of lumbosacral intervertebral disc 03/25/2016   Displacement of lumbar intervertebral disc without myelopathy 11/22/2015   Lumbar radiculopathy 10/30/2015   Atypical chest pain 01/18/2015   Bundle branch block, right 01/18/2015   Breath shortness 01/18/2015   Polycythemia, secondary 12/20/2014   Rotator cuff tear 10/10/2013   Backache 12/22/2012    Past Surgical History:  Procedure Laterality Date   APPENDECTOMY     BACK SURGERY     CHOLECYSTECTOMY      COLONOSCOPY  06/2010   + small bowel capsule study   EYE SURGERY     childhood   KNEE SURGERY     ROTATOR CUFF REPAIR     rt shoulder    Prior to Admission medications   Medication Sig Start Date End Date Taking? Authorizing Provider  clotrimazole-betamethasone (LOTRISONE) cream Apply 1 application topically 2 (two) times daily. 08/10/20  Yes Edrick Kins, DPM  econazole nitrate 1 % cream Apply topically 2 (two) times daily. 09/17/21  Yes Sable Feil, PA-C  esomeprazole (NEXIUM) 40 MG capsule Take 40 mg by mouth daily at 12 noon.   Yes [provider]  gabapentin (NEURONTIN) 300 MG capsule Take by mouth. 12/10/21  Yes [provider]  lisinopril (ZESTRIL) 20 MG tablet Take 1 tablet (20 mg total) by mouth daily. 07/17/22  Yes Sable Feil, PA-C  meloxicam (MOBIC) 15 MG tablet Take 1 tablet (15 mg total) by mouth daily. 06/26/22  Yes Sable Feil, PA-C  Multiple Vitamins-Minerals (MULTIVITAMIN ADULTS PO) Take 1 tablet by mouth daily at 6 PM.   Yes [provider]  tamsulosin (FLOMAX) 0.4 MG CAPS capsule Take 1 capsule (0.4 mg total) by mouth daily. 09/17/21  Yes Sable Feil, PA-C  terbinafine (LAMISIL) 250 MG tablet Take 1 tablet (250 mg total) by mouth daily. 06/26/22  Yes Sable Feil, PA-C  testosterone cypionate (DEPOTESTOSTERONE CYPIONATE) 200 MG/ML injection  Inject 0.5 mLs (100 mg total) into the muscle every 14 (fourteen) days. 07/17/22  Yes Sable Feil, PA-C  amphetamine-dextroamphetamine (ADDERALL XR) 20 MG 24 hr capsule Take 1 capsule (20 mg total) by mouth daily. 08/14/22   Sable Feil, PA-C  cyclobenzaprine (FLEXERIL) 5 MG tablet Take 1 tablet (5 mg total) by mouth 3 (three) times daily as needed for muscle spasms. Patient not taking: Reported on 08/14/2022 11/20/20   Apolonio Schneiders, FNP  orphenadrine (NORFLEX) 100 MG tablet Take 1 tablet (100 mg total) by mouth 2 (two) times daily. Patient not taking: Reported on 08/14/2022 03/11/22   Sable Feil, PA-C  tiZANidine (ZANAFLEX) 4 MG tablet Take by mouth. Patient not taking: Reported on 08/14/2022 05/06/22   [provider]    Allergies Patient has no known allergies.  Family History  Problem Relation Age of Onset   Thyroid cancer Father    Rheum arthritis Mother    Abnormal EKG Mother    Colon cancer Paternal Grandmother     Social History Social History   Tobacco Use   Smoking status: Never   Smokeless tobacco: Former    Types: Nurse, children's Use: Never used  Substance Use Topics   Alcohol use: Yes    Alcohol/week: 0.0 standard drinks of alcohol    Comment: occ   Drug use: No    Review of Systems Constitutional: No fever/chills.  Morbid obesity Eyes: No visual changes. ENT: No sore throat. Cardiovascular: Denies chest pain. Respiratory: Denies shortness of breath. Gastrointestinal: No abdominal pain.  No nausea, no vomiting.  No diarrhea.  No constipation. Genitourinary: Negative for dysuria. Musculoskeletal: Negative for back pain. Skin: Negative for rash. Neurological: Negative for headaches, focal weakness or numbness. Psychiatric: ADHD Endocrine: Hypertension  ____________________________________________   PHYSICAL EXAM:  Bundle branch block.  Unchanged from SIGNS: BP is 124/82, pulse 93, respiration 12, temperature 90.5, patient 1% O2 sat on room air.  Patient voiced Constitutional: Alert and oriented. Well appearing and in no acute distress. Eyes: Conjunctivae are normal. PERRL. EOMI. Head: Atraumatic. Nose: No congestion/rhinnorhea. Mouth/Throat: Mucous membranes are moist.  Oropharynx non-erythematous. Neck: No stridor.  No cervical spine tenderness to palpation. Hematological/Lymphatic/Immunilogical: No cervical lymphadenopathy. Cardiovascular: Normal rate, regular rhythm. Grossly normal heart sounds.  Good peripheral circulation. Respiratory: Normal respiratory effort.  No retractions. Lungs  CTAB. Gastrointestinal: Soft and nontender.  Distention secondary to body habitus.   No abdominal bruits. No CVA tenderness. Genitourinary: Deferred Musculoskeletal: No obvious deformity to the left shoulder.  Patient has moderate guarding palpation GH joint.  Patient has full Nikkel range of motion the left shoulder.  Neurologic:  Normal speech and language. No gross focal neurologic deficits are appreciated. No gait instability. Skin:  Skin is warm, dry and intact. No rash noted. Psychiatric: Mood and affect are normal. Speech and behavior are normal.  ____________________________________________   LABS            Component Ref Range & Units 3 d ago (08/11/22) 11 mo ago (09/09/21) 1 yr ago (10/04/20) 2 yr ago (07/18/20) 2 yr ago (07/08/20) 2 yr ago (06/29/20) 2 yr ago (02/28/20)  Color, UA  dark yellow Orange Yellow Yellow R  Yellow R yellow  Clarity, UA  clear Clear Clear    clear  Glucose, UA Negative Negative Negative Negative    Negative  Bilirubin, UA  neg Negative Negative Negative R  Negative R negative  Ketones, UA  neg Negative Negative Negative  R  Negative R negative  Spec Grav, UA 1.010 - 1.025 1.025 1.025 1.010 1.010 R  1.020 R 1.025  Blood, UA  neg Negative Negative Negative R  Negative R negative  pH, UA 5.0 - 8.0 6.0 6.0 8.0 7.0 R  6.0 R 6.0  Protein, UA Negative Negative Negative Negative Negative R  Negative R Negative  Urobilinogen, UA 0.2 or 1.0 E.U./dL 0.2 0.2 0.2    0.2  Nitrite, UA  neg Negative Negative Negative R  Negative R negative  Leukocytes, UA Negative Negative Negative Negative Negative  Negative Small (1+) Abnormal   Appearance      CLOUDY Abnormal  R  dark  Odor          Resulting Agency     [facility]                      Component Ref Range & Units 3 d ago (08/11/22) 11 mo ago (09/10/21) 1 yr ago (10/04/20) 2 yr ago (07/08/20) 2 yr ago (07/08/20) 2 yr ago (09/06/19) 3 yr ago (06/15/19)  Glucose 70 - 99 mg/dL 93 84 60 Low  R  110 High  CM  98 R   Uric Acid 3.8 - 8.4 mg/dL 7.0 7.3 CM 7.5 CM   6.7 CM   Comment:            Therapeutic target for gout patients: <6.0  BUN 6 - 24 mg/dL '19 18 13 20 '$ R  16   Creatinine, Ser 0.76 - 1.27 mg/dL 1.08 1.01 1.03 1.13 R  1.04   eGFR >59 mL/min/1.73 83 90 88      BUN/Creatinine Ratio 9 - '20 18 18 13   15   '$ Sodium 134 - 144 mmol/L 139 139 141 138 R  140   Potassium 3.5 - 5.2 mmol/L 4.6 4.7 5.0 3.9 R  4.5   Chloride 96 - 106 mmol/L 102 102 102 106 R  105   Calcium 8.7 - 10.2 mg/dL 9.6 9.5 9.3 9.0 R  9.2   Phosphorus 2.8 - 4.1 mg/dL 3.3 2.4 Low  3.0   2.3 Low    Total Protein 6.0 - 8.5 g/dL 6.7 6.8 6.8   6.8   Albumin 3.8 - 4.9 g/dL 4.1 4.2 4.1 R   4.4 R   Globulin, Total 1.5 - 4.5 g/dL 2.6 2.6 2.7   2.4   Albumin/Globulin Ratio 1.2 - 2.2 1.6 1.6 1.5   1.8   Bilirubin Total 0.0 - 1.2 mg/dL 0.5 0.7 0.5   0.6   Alkaline Phosphatase 44 - 121 IU/L 66 71 77   69 R   LDH 121 - 224 IU/L 195 225 High  311 High    204   AST 0 - 40 IU/L 33 36 41 High    34   ALT 0 - 44 IU/L 41 49 High  49 High    41   GGT 0 - 65 IU/L '27 23 25   20   '$ Iron 38 - 169 ug/dL 89 88 58   96   Cholesterol, Total 100 - 199 mg/dL 120 121 109   123   Triglycerides 0 - 149 mg/dL 101 85 80   70   HDL >39 mg/dL 33 Low  33 Low  30 Low    36 Low    VLDL Cholesterol Cal 5 - 40 mg/dL '19 17 16   14   '$ LDL Chol Calc (  NIH) 0 - 99 mg/dL 68 71 63   73   Chol/HDL Ratio 0.0 - 5.0 ratio 3.6 3.7 CM 3.6 CM   3.4 CM   Comment:                                   T. Chol/HDL Ratio                                             Men  Women                               1/2 Avg.Risk  3.4    3.3                                   Avg.Risk  5.0    4.4                                2X Avg.Risk  9.6    7.1                                3X Avg.Risk 23.4   11.0  Estimated CHD Risk 0.0 - 1.0 times avg. 0.6 0.6 CM 0.6 CM   0.5 CM   Comment: The CHD Risk is based on the T. Chol/HDL ratio. Other factors affect CHD Risk such as hypertension,  smoking, diabetes, severe obesity, and family history of premature CHD.  TSH 0.450 - 4.500 uIU/mL 2.440 2.860 2.540   2.560   T4, Total 4.5 - 12.0 ug/dL 8.9 10.5 10.2   10.0   T3 Uptake Ratio 24 - 39 % 29 29 32   29   Free Thyroxine Index 1.2 - 4.9 2.6 3.0 3.3   2.9   Prostate Specific Ag, Serum 0.0 - 4.0 ng/mL 0.2 0.2 CM 0.3 CM   0.5 CM   Comment: Roche ECLIA methodology. According to the American Urological Association, Serum PSA should decrease and remain at undetectable levels after radical prostatectomy. The AUA defines biochemical recurrence as an initial PSA value 0.2 ng/mL or greater followed by a subsequent confirmatory PSA value 0.2 ng/mL or greater. Values obtained with different assay methods or kits cannot be used interchangeably. Results cannot be interpreted as absolute evidence of the presence or absence of malignant disease.  WBC 3.4 - 10.8 x10E3/uL 8.7 8.0 10.9 High   16.5 High  R 7.5 9.2  RBC 4.14 - 5.80 x10E6/uL 5.98 High  5.90 High  5.67  5.78 R 5.81 High  6.05 High   Hemoglobin 13.0 - 17.7 g/dL 16.4 16.2 15.7  15.9 R 16.3 16.7  Hematocrit 37.5 - 51.0 % 48.9 48.2 48.7  48.5 R 48.4 51.4 High   MCV 79 - 97 fL 82 82 86  83.9 R 83 85  MCH 26.6 - 33.0 pg 27.4 27.5 27.7  27.5 R 28.1 27.6  MCHC 31.5 - 35.7 g/dL 33.5 33.6 32.2  32.8 R 33.7 32.5  RDW 11.6 - 15.4 % 14.4 13.8 14.2  14.8 R 13.9 13.7  Platelets 150 -  450 x10E3/uL 345 350 335  409 High  R 349 309  Neutrophils Not Estab. % 65 67 70   70 72  Lymphs Not Estab. % '23 22 19   22 19  '$ Monocytes Not Estab. % '7 8 7   6 5  '$ Eos Not Estab. % '3 2 2   1 2  '$ Basos Not Estab. % '1 1 1   '$ 0 1  Neutrophils Absolute 1.4 - 7.0 x10E3/uL 5.7 5.4 7.7 High    5.3 6.7  Lymphocytes Absolute 0.7 - 3.1 x10E3/uL 2.0 1.7 2.1   1.6 1.8  Monocytes Absolute 0.1 - 0.9 x10E3/uL 0.6 0.6 0.7   0.4 0.5  EOS (ABSOLUTE) 0.0 - 0.4 x10E3/uL 0.2 0.2 0.2   0.1 0.2  Basophils Absolute 0.0 - 0.2 x10E3/uL 0.1 0.0 0.1   0.0 0.1  Immature Granulocytes Not  Estab. % 1 0 '1   1 1                 '$ ____________________________________________  EKG Right bundle block branch.  Unchanged from last EKG.  Asymptomatic, patient is 77 bpm.  ____________________________________________   ____________________________________________   INITIAL IMPRESSION / ASSESSMENT AND PLAN  As part of my medical decision making, I reviewed the following data within the [place]      Discussed physical exam and lab results with patient.  Further evaluation left shoulder with x-ray.        ____________________________________________   FINAL CLINICAL IMPRESSION Well exam   ED Discharge Orders          Ordered    DG Shoulder Left        08/14/22 1458             Note:  This document was prepared using Dragon voice recognition software and may include unintentional dictation errors.

## 2022-08-20 ENCOUNTER — Ambulatory Visit
Admission: RE | Admit: 2022-08-20 | Discharge: 2022-08-20 | Disposition: A | Discharge status: Home / Self Care | Payer: 59 | Attending: Physician Assistant | Admitting: Physician Assistant

## 2022-08-20 ENCOUNTER — Ambulatory Visit
Admission: RE | Admit: 2022-08-20 | Discharge: 2022-08-20 | Disposition: A | Discharge status: Home / Self Care | Payer: 59 | Source: Ambulatory Visit | Attending: Physician Assistant | Admitting: Physician Assistant

## 2022-08-20 DIAGNOSIS — G8929 Other chronic pain: Secondary | ICD-10-CM | POA: Insufficient documentation

## 2022-08-20 DIAGNOSIS — M25512 Pain in left shoulder: Secondary | ICD-10-CM | POA: Diagnosis not present

## 2022-08-20 DIAGNOSIS — M19012 Primary osteoarthritis, left shoulder: Secondary | ICD-10-CM | POA: Diagnosis not present

## 2022-08-28 IMAGING — CT CT RENAL STONE PROTOCOL
2 of 4 series · 15 of 46 positions shown, 17 images · non-contrast
Comparison: CT abdomen pelvis dated 09/13/2013.

CLINICAL DATA: 49-year-old male with flank pain. Concern for kidney
stone.

EXAM:
CT ABDOMEN AND PELVIS WITHOUT CONTRAST
TECHNIQUE: Multidetector CT imaging of the abdomen and pelvis was performed
following the standard protocol without IV contrast.

[Series 2: stone full standard · axial · 0.82mm/px · z∈[-555,-50]mm · 12 of 111 slices shown, 14 images]
[im 5/111  soft-tissue]
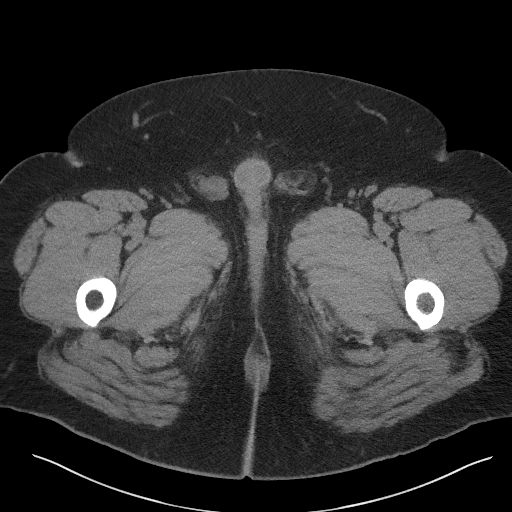
[im 5/111  bone]
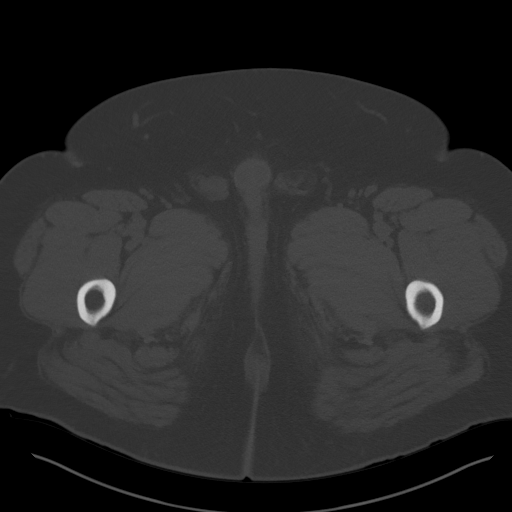
[im 14/111  soft-tissue]
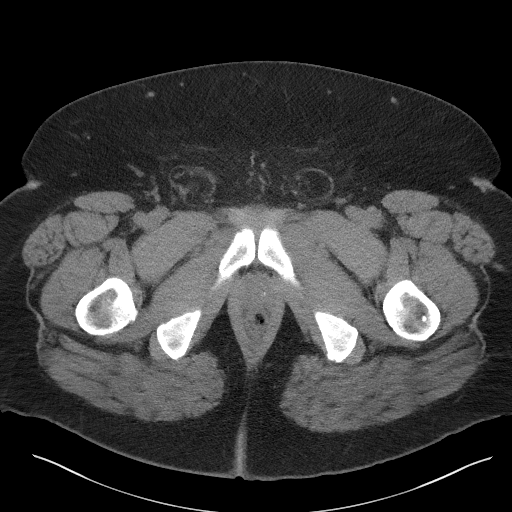
[im 23/111  soft-tissue]
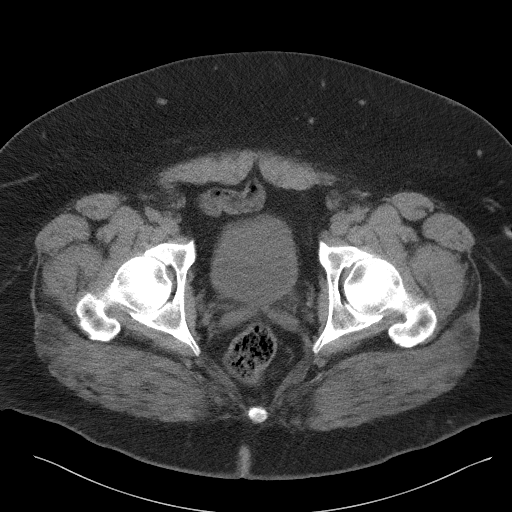
[im 33/111  soft-tissue]
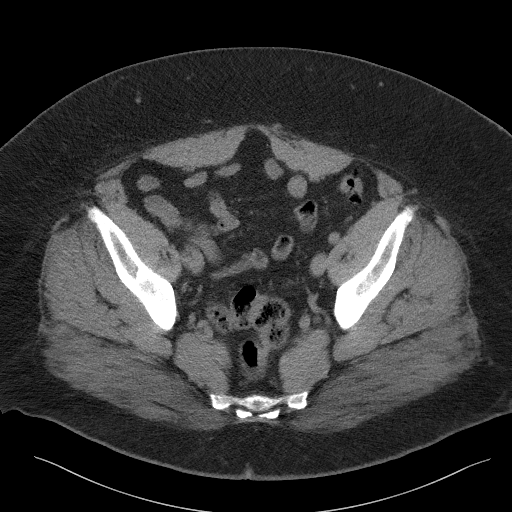
[im 42/111  soft-tissue]
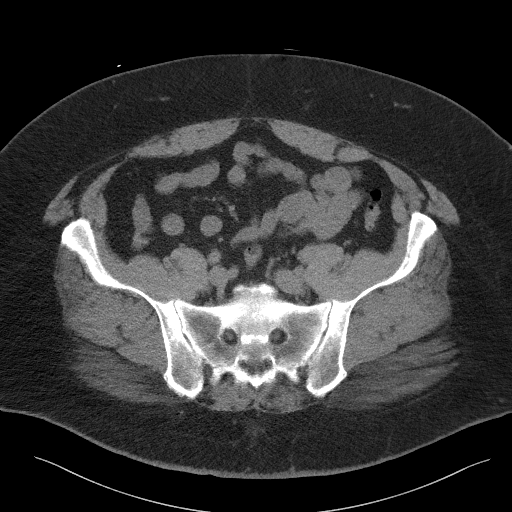
[im 51/111  soft-tissue]
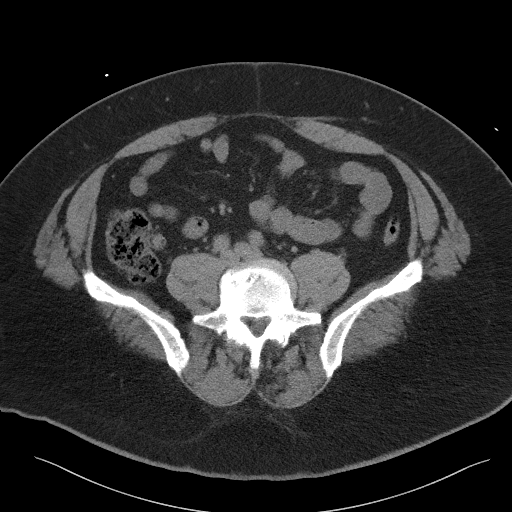
[im 60/111  soft-tissue]
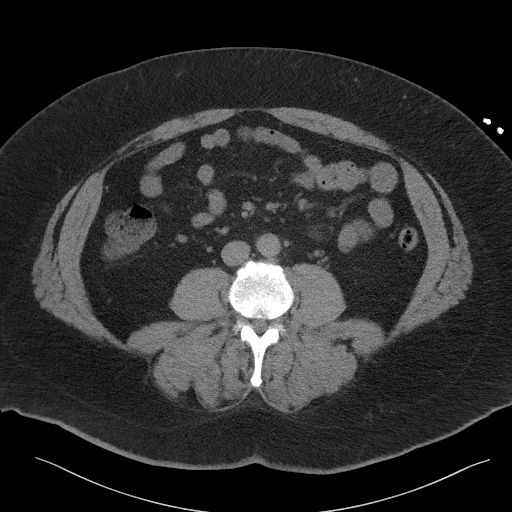
[im 69/111  soft-tissue]
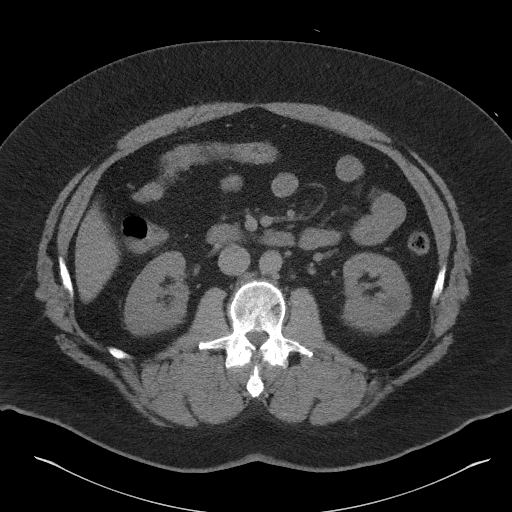
[im 78/111  soft-tissue]
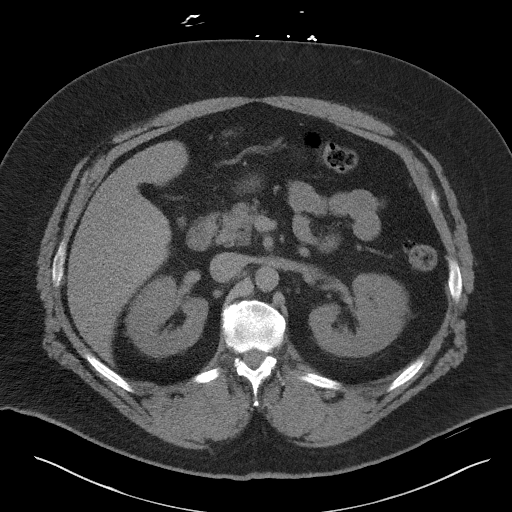
[im 78/111  bone]
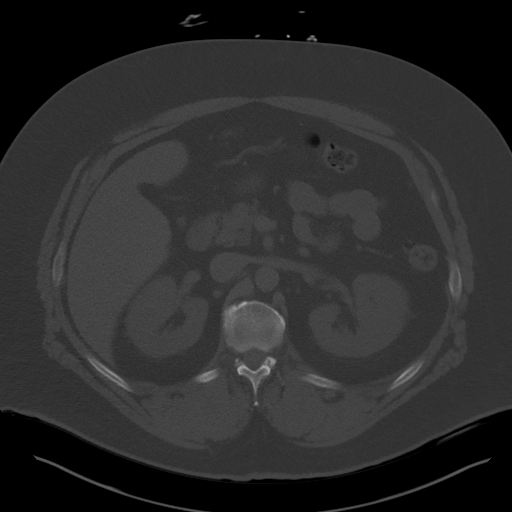
[im 88/111  soft-tissue]
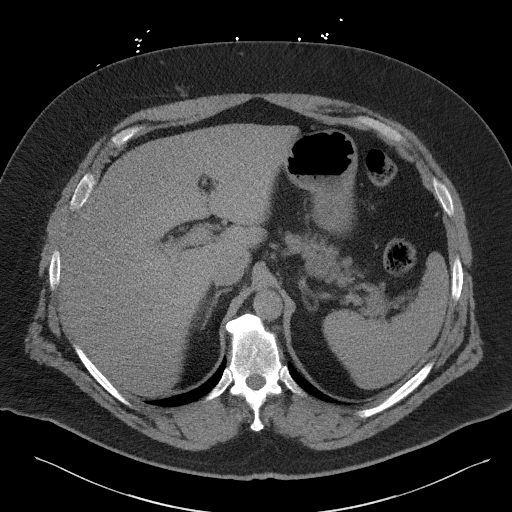
[im 97/111  soft-tissue]
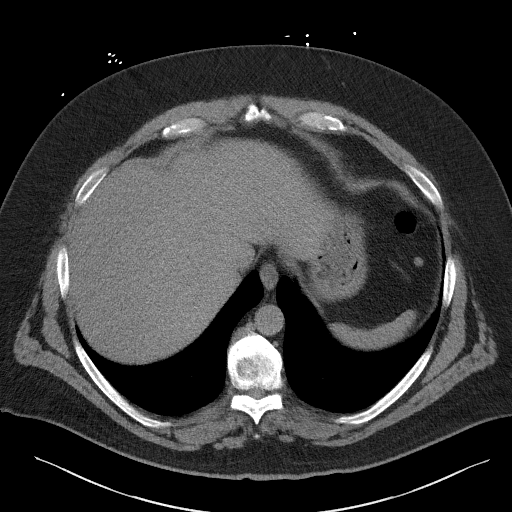
[im 106/111  soft-tissue]
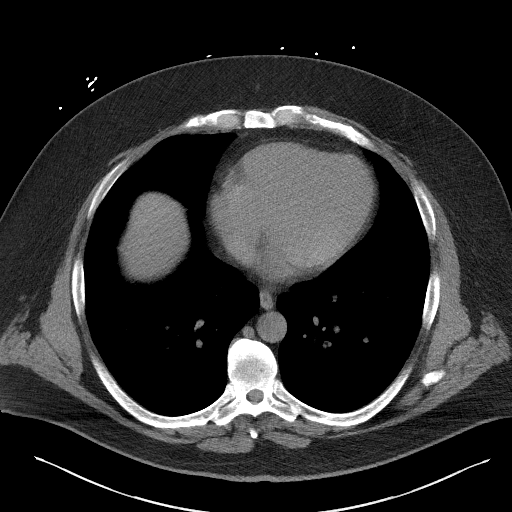

[Series 5: coronal · coronal · 0.85mm/px · 3 of 173 slices shown]
[im 58/173  soft-tissue]
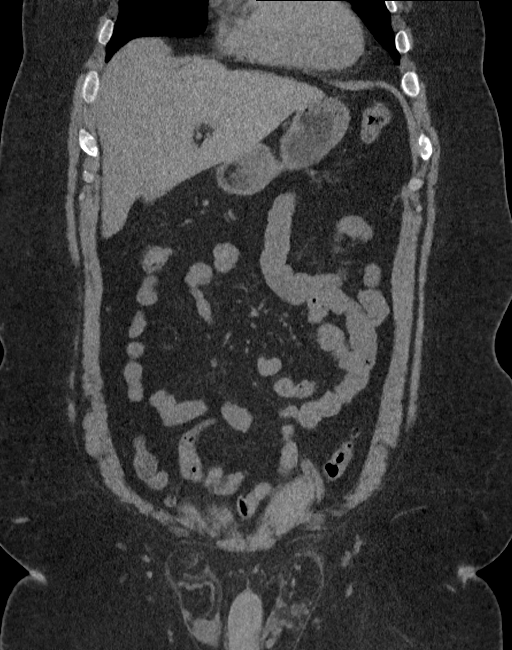
[im 77/173  soft-tissue]
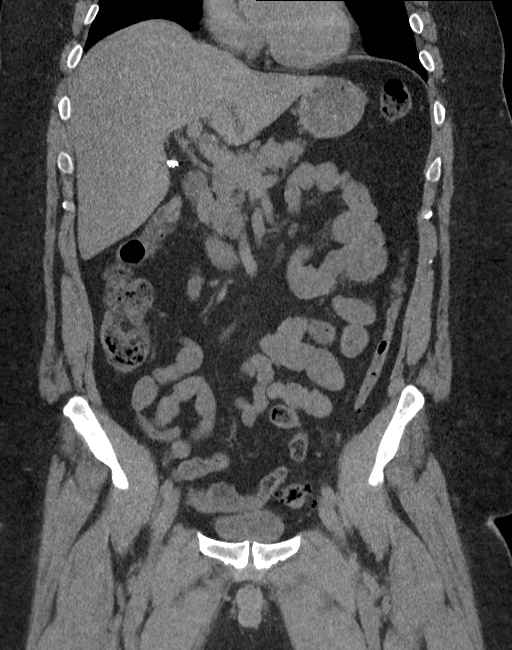
[im 96/173  soft-tissue]
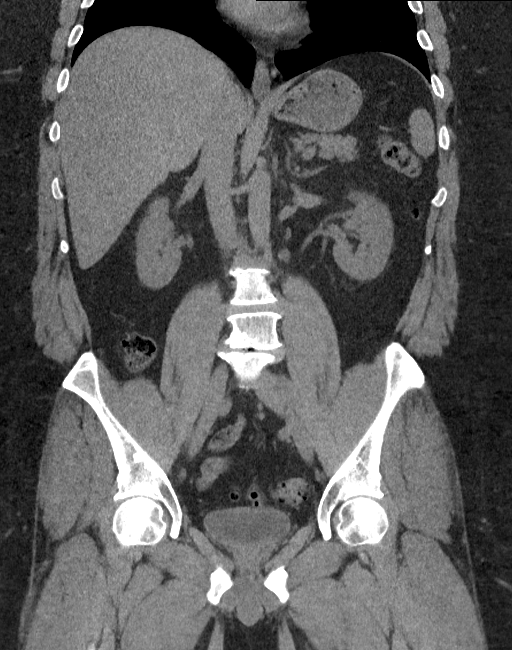

[15 of 46 positions shown; findings below may reference images not displayed]

FINDINGS: Evaluation of this exam is limited in the absence of intravenous
contrast.

Lower chest: The visualized lung bases are clear.

No intra-abdominal free air or free fluid.

Hepatobiliary: Fatty infiltration of the liver. No intrahepatic
biliary ductal dilatation. Cholecystectomy. No retained calcified
stone noted in the central CBD.

Pancreas: Unremarkable. No pancreatic ductal dilatation or
surrounding inflammatory changes.

Spleen: Normal in size without focal abnormality.

Adrenals/Urinary Tract: The adrenal glands unremarkable. There is a
punctate nonobstructing left renal interpolar calculus. No
hydronephrosis. There is slight enlargement of the mid to lower pole
of the left kidney with overall decreased renal parenchyma at
attenuation and minimal perinephric haziness. Findings most
consistent with pyelonephritis. Correlation with urinalysis
recommended. A faint 13 mm hypodense focus in the inferior pole of
the left kidney likely representing a cyst and appears to have been
present on the prior CT. Developing abscess is less likely.

The right kidney is unremarkable. The visualized ureters and urinary
bladder appear unremarkable.

Stomach/Bowel: There is sigmoid diverticulosis without active
inflammatory changes. There is no bowel obstruction or active
inflammation. The appendix is surgically absent.

Vascular/Lymphatic: The abdominal aorta and IVC unremarkable. No
portal venous gas. There is no adenopathy.

Reproductive: The prostate and seminal vesicles are grossly
unremarkable. No pelvic mass.

Other: None

Musculoskeletal: Degenerative changes of the lower lumbar spine. No
acute osseous pathology.
IMPRESSION: 1. Findings most consistent with left-sided pyelonephritis.
Correlation with urinalysis recommended. No definite drainable fluid
collection or abscess.
2. Punctate nonobstructing left renal interpolar calculus. No
hydronephrosis.
3. Fatty liver.
4. Sigmoid diverticulosis. No bowel obstruction.

## 2022-09-11 ENCOUNTER — Ambulatory Visit: Payer: Self-pay | Admitting: Physician Assistant

## 2022-09-11 ENCOUNTER — Encounter: Payer: Self-pay | Admitting: Physician Assistant

## 2022-09-11 VITALS — BP 115/78 | HR 82 | Temp 97.8°F | Resp 12

## 2022-09-11 DIAGNOSIS — G8929 Other chronic pain: Secondary | ICD-10-CM

## 2022-09-11 NOTE — Addendum Note (Signed)
Addended by: Aliene Altes on: 09/11/2022 03:11 PM   Modules accepted: Orders

## 2022-09-11 NOTE — Progress Notes (Signed)
   Subjective: Right shoulder pain    Patient ID: Jeffrey Carlson, male    DOB: 04/21/1971, 52 y.o.   MRN: [id number]  HPI Patient complain of increasing right shoulder pain and decreased range of motion with abduction overhead reaching.  No provocative incident for complaint.  No palliative measures.  Patient had imaging done on 08/20/2022 is here to discuss findings.   Review of Systems Hypertension, hypogonadism, GERD, and obesity.    Objective:   Physical Exam BP is 115/78, pulse 82, respiration 12, temperature 97.8, and patient 97% O2 sat on room air. No obvious deformity of the left shoulder.  Patient has decreased range of motion abduction and overhead reaching.  Patient has moderate guarding palpation of the AC joint and also of the Clover joint.  X-rays reveal moderate AC osteoarthritis.       Assessment & Plan: Osteoarthritis right AC joint   Definitive evaluation and treatment of orthopedic is warranted.

## 2022-09-11 NOTE — Addendum Note (Signed)
Addended by: Aliene Altes on: 09/11/2022 03:27 PM   Modules accepted: Orders

## 2022-09-29 DIAGNOSIS — M542 Cervicalgia: Secondary | ICD-10-CM | POA: Diagnosis not present

## 2022-09-29 DIAGNOSIS — M9901 Segmental and somatic dysfunction of cervical region: Secondary | ICD-10-CM | POA: Diagnosis not present

## 2022-09-29 DIAGNOSIS — M9902 Segmental and somatic dysfunction of thoracic region: Secondary | ICD-10-CM | POA: Diagnosis not present

## 2022-09-29 DIAGNOSIS — M5412 Radiculopathy, cervical region: Secondary | ICD-10-CM | POA: Diagnosis not present

## 2022-09-30 DIAGNOSIS — M5412 Radiculopathy, cervical region: Secondary | ICD-10-CM | POA: Diagnosis not present

## 2022-09-30 DIAGNOSIS — M9901 Segmental and somatic dysfunction of cervical region: Secondary | ICD-10-CM | POA: Diagnosis not present

## 2022-09-30 DIAGNOSIS — M9902 Segmental and somatic dysfunction of thoracic region: Secondary | ICD-10-CM | POA: Diagnosis not present

## 2022-09-30 DIAGNOSIS — M542 Cervicalgia: Secondary | ICD-10-CM | POA: Diagnosis not present

## 2022-10-06 DIAGNOSIS — M5412 Radiculopathy, cervical region: Secondary | ICD-10-CM | POA: Diagnosis not present

## 2022-10-06 DIAGNOSIS — M542 Cervicalgia: Secondary | ICD-10-CM | POA: Diagnosis not present

## 2022-10-06 DIAGNOSIS — M9901 Segmental and somatic dysfunction of cervical region: Secondary | ICD-10-CM | POA: Diagnosis not present

## 2022-10-06 DIAGNOSIS — M9902 Segmental and somatic dysfunction of thoracic region: Secondary | ICD-10-CM | POA: Diagnosis not present

## 2022-10-08 DIAGNOSIS — M9902 Segmental and somatic dysfunction of thoracic region: Secondary | ICD-10-CM | POA: Diagnosis not present

## 2022-10-08 DIAGNOSIS — M5412 Radiculopathy, cervical region: Secondary | ICD-10-CM | POA: Diagnosis not present

## 2022-10-08 DIAGNOSIS — M542 Cervicalgia: Secondary | ICD-10-CM | POA: Diagnosis not present

## 2022-10-08 DIAGNOSIS — M9901 Segmental and somatic dysfunction of cervical region: Secondary | ICD-10-CM | POA: Diagnosis not present

## 2022-10-09 ENCOUNTER — Other Ambulatory Visit: Payer: Self-pay

## 2022-10-09 ENCOUNTER — Ambulatory Visit: Payer: Self-pay

## 2022-10-09 DIAGNOSIS — M5412 Radiculopathy, cervical region: Secondary | ICD-10-CM | POA: Diagnosis not present

## 2022-10-09 DIAGNOSIS — M9901 Segmental and somatic dysfunction of cervical region: Secondary | ICD-10-CM | POA: Diagnosis not present

## 2022-10-09 DIAGNOSIS — R3915 Urgency of urination: Secondary | ICD-10-CM

## 2022-10-09 DIAGNOSIS — M542 Cervicalgia: Secondary | ICD-10-CM | POA: Diagnosis not present

## 2022-10-09 DIAGNOSIS — M9902 Segmental and somatic dysfunction of thoracic region: Secondary | ICD-10-CM | POA: Diagnosis not present

## 2022-10-09 DIAGNOSIS — F988 Other specified behavioral and emotional disorders with onset usually occurring in childhood and adolescence: Secondary | ICD-10-CM

## 2022-10-09 DIAGNOSIS — E291 Testicular hypofunction: Secondary | ICD-10-CM

## 2022-10-09 NOTE — Progress Notes (Signed)
PT received .05 ml of testosterone.

## 2022-10-14 MED ORDER — TAMSULOSIN HCL 0.4 MG PO CAPS: 0.4000 mg | ORAL_CAPSULE | Freq: Every day | ORAL | 3 refills | Status: DC

## 2022-10-14 MED ORDER — AMPHETAMINE-DEXTROAMPHET ER 20 MG PO CP24: 20.0000 mg | ORAL_CAPSULE | Freq: Every day | ORAL | 0 refills | Status: DC

## 2022-10-20 DIAGNOSIS — M19012 Primary osteoarthritis, left shoulder: Secondary | ICD-10-CM | POA: Insufficient documentation

## 2022-10-20 DIAGNOSIS — M7582 Other shoulder lesions, left shoulder: Secondary | ICD-10-CM | POA: Diagnosis not present

## 2022-10-30 DIAGNOSIS — M5412 Radiculopathy, cervical region: Secondary | ICD-10-CM | POA: Diagnosis not present

## 2022-10-30 DIAGNOSIS — M5416 Radiculopathy, lumbar region: Secondary | ICD-10-CM | POA: Diagnosis not present

## 2022-10-30 DIAGNOSIS — Z6841 Body Mass Index (BMI) 40.0 and over, adult: Secondary | ICD-10-CM | POA: Diagnosis not present

## 2022-11-11 ENCOUNTER — Ambulatory Visit: Payer: Self-pay | Admitting: Physician Assistant

## 2022-11-12 ENCOUNTER — Ambulatory Visit: Payer: Self-pay

## 2022-11-12 DIAGNOSIS — E291 Testicular hypofunction: Secondary | ICD-10-CM

## 2022-11-12 NOTE — Progress Notes (Signed)
Pt received bi-weekly injection. 

## 2022-12-09 ENCOUNTER — Other Ambulatory Visit: Payer: Self-pay

## 2022-12-09 DIAGNOSIS — F988 Other specified behavioral and emotional disorders with onset usually occurring in childhood and adolescence: Secondary | ICD-10-CM

## 2022-12-09 MED ORDER — AMPHETAMINE-DEXTROAMPHET ER 20 MG PO CP24: 20.0000 mg | ORAL_CAPSULE | Freq: Every day | ORAL | 0 refills | Status: DC

## 2023-01-06 DIAGNOSIS — Z86010 Personal history of colonic polyps: Secondary | ICD-10-CM | POA: Diagnosis not present

## 2023-01-06 DIAGNOSIS — Z09 Encounter for follow-up examination after completed treatment for conditions other than malignant neoplasm: Secondary | ICD-10-CM | POA: Diagnosis not present

## 2023-01-12 ENCOUNTER — Other Ambulatory Visit: Payer: Self-pay | Admitting: Physician Assistant

## 2023-01-12 ENCOUNTER — Ambulatory Visit: Payer: Self-pay

## 2023-01-12 DIAGNOSIS — E291 Testicular hypofunction: Secondary | ICD-10-CM

## 2023-01-12 DIAGNOSIS — F988 Other specified behavioral and emotional disorders with onset usually occurring in childhood and adolescence: Secondary | ICD-10-CM

## 2023-01-12 MED ORDER — AMPHETAMINE-DEXTROAMPHET ER 20 MG PO CP24: 20.0000 mg | ORAL_CAPSULE | Freq: Every day | ORAL | 0 refills | Status: DC

## 2023-01-12 NOTE — Progress Notes (Signed)
Pt completed bi weekly testosterone injection. 

## 2023-02-03 ENCOUNTER — Encounter: Payer: Self-pay | Admitting: Internal Medicine

## 2023-02-03 ENCOUNTER — Encounter: Payer: Self-pay | Admitting: Podiatry

## 2023-02-03 ENCOUNTER — Ambulatory Visit: Payer: Self-pay | Admitting: Podiatry

## 2023-02-03 ENCOUNTER — Ambulatory Visit (INDEPENDENT_AMBULATORY_CARE_PROVIDER_SITE_OTHER): Payer: 59

## 2023-02-03 VITALS — BP 145/86 | HR 87

## 2023-02-03 DIAGNOSIS — M778 Other enthesopathies, not elsewhere classified: Secondary | ICD-10-CM

## 2023-02-03 MED ORDER — CLOTRIMAZOLE-BETAMETHASONE 1-0.05 % EX CREA: 1.0000 | TOPICAL_CREAM | Freq: Two times a day (BID) | CUTANEOUS | 1 refills | Status: AC

## 2023-02-03 MED ORDER — TERBINAFINE HCL 250 MG PO TABS: 250.0000 mg | ORAL_TABLET | Freq: Every day | ORAL | 1 refills | Status: DC

## 2023-02-03 NOTE — Progress Notes (Signed)
   Chief Complaint  Patient presents with   Toe Injury    "I thought I had cracked that Jones fracture again.  Since it happened, it's gotten better but I still want him to check it.  I also need a refill of the Athlete's Foot medicine."    HPI: 52 y.o. male presenting today for evaluation of the left foot injury.  Patient states that he had left foot pain about 3 weeks ago but it has since resolved.  He was concerned that possibly he fractured a metatarsal.  He would like to have it evaluated and x-rayed today  Patient also has a chronic history of tinea pedis/athlete's foot.  He has taken the oral Lamisil in the past with good results.  He is requesting a refill of the oral Lamisil  Past Medical History:  Diagnosis Date   Adult ADHD    Arthritis    Barrett esophagus    Colon polyps    Degenerative joint disease    GERD (gastroesophageal reflux disease)    Hiatal hernia    Hypertension    dr Dorothey Baseman   [place]   Leukocytosis    Obesity    Polycythemia, secondary 12/20/2014   Sleep apnea    cpap   Testosterone deficiency    Undescended right testicle     Past Surgical History:  Procedure Laterality Date   APPENDECTOMY     BACK SURGERY     CHOLECYSTECTOMY     COLONOSCOPY  06/2010   + small bowel capsule study   EYE SURGERY     childhood   KNEE SURGERY     ROTATOR CUFF REPAIR     rt shoulder    No Known Allergies   Physical Exam: General: The patient is alert and oriented x3 in no acute distress.  Dermatology: Skin is warm, dry and supple bilateral lower extremities.  Diffuse hyperkeratosis of skin noted consistent with chronic tinea pedis.  Vascular: Palpable pedal pulses bilaterally. Capillary refill within normal limits.  No appreciable edema.  No erythema.  Neurological: Grossly intact via light touch  Musculoskeletal Exam: No pedal deformities noted.  There is no tenderness or pain with palpation throughout the foot  Radiographic Exam LT foot 02/03/2023:   Normal osseous mineralization. Diffuse arthritic changes noted throughout the foot.  No acute fractures identified.  Impression: Negative  Assessment/Plan of Care: 1.  Capsulitis left foot; resolved 2.  Chronic tinea pedis left  -Patient states that he is feeling much better.  He no longer has any pain or tenderness associated to the foot -Advise against going barefoot.  Recommend good supportive tennis shoes and sneakers -Refill prescription for Lamisil 250 mg #60 daily -Prescription for Lotrisone cream apply twice daily -Return to clinic as needed       Felecia Shelling, DPM [facility]  Dr. Felecia Shelling, DPM    [address] [place], [place] [address]                Office [phone]  Fax [phone]

## 2023-02-10 ENCOUNTER — Other Ambulatory Visit: Payer: Self-pay

## 2023-02-10 ENCOUNTER — Ambulatory Visit: Payer: 59

## 2023-02-10 VITALS — BP 116/70 | HR 93 | Resp 12

## 2023-02-10 DIAGNOSIS — E291 Testicular hypofunction: Secondary | ICD-10-CM

## 2023-02-10 DIAGNOSIS — F988 Other specified behavioral and emotional disorders with onset usually occurring in childhood and adolescence: Secondary | ICD-10-CM

## 2023-02-10 MED ORDER — TESTOSTERONE CYPIONATE 200 MG/ML IM SOLN
100.0000 mg | INTRAMUSCULAR | Status: AC
  Administered 2023-02-10 – 2023-07-13 (×6): 100 mg via INTRAMUSCULAR

## 2023-02-11 MED ORDER — AMPHETAMINE-DEXTROAMPHET ER 20 MG PO CP24: 20.0000 mg | ORAL_CAPSULE | Freq: Every day | ORAL | 0 refills | Status: DC

## 2023-02-23 ENCOUNTER — Ambulatory Visit: Payer: Self-pay | Admitting: Physician Assistant

## 2023-02-23 ENCOUNTER — Encounter: Payer: Self-pay | Admitting: Physician Assistant

## 2023-02-23 ENCOUNTER — Ambulatory Visit: Payer: 59 | Admitting: Physician Assistant

## 2023-02-23 DIAGNOSIS — W57XXXA Bitten or stung by nonvenomous insect and other nonvenomous arthropods, initial encounter: Secondary | ICD-10-CM

## 2023-02-23 MED ORDER — HYDROXYZINE PAMOATE 25 MG PO CAPS: 25.0000 mg | ORAL_CAPSULE | Freq: Three times a day (TID) | ORAL | 0 refills | Status: AC | PRN

## 2023-02-23 MED ORDER — CEPHALEXIN 500 MG PO CAPS: 500.0000 mg | ORAL_CAPSULE | Freq: Four times a day (QID) | ORAL | 0 refills | Status: DC

## 2023-02-23 NOTE — Progress Notes (Signed)
   Subjective: Infected insect bite    Patient ID: THO RADICE, male    DOB: 1970/09/22, 52 y.o.   MRN: [id number]  HPI Patient presents with with papular lesions on erythematous and edematous base.  Patient states he was bit on the back of his neck 2 days ago by doing yard work.  Stated initially was intense pain but is now intense itching.  Showed pictures revealing increase in erythema.  Denies nausea vomiting.   Review of Systems GERD, hypertension, and hypogonadism.    Objective:   Physical Exam Vital signs not taken. Patient has a papular lesion on erythematous/ edematous base.  Mild drainage.  Mild guarding with palpation.  Patient has free and equal range of motion.       Assessment & Plan: Infected insect bite  Patient given a prescription for Atarax and Keflex.  Advised patient follow-up in 1 week if no improvement sooner if condition worsens.

## 2023-03-19 ENCOUNTER — Encounter: Payer: Self-pay | Admitting: Physician Assistant

## 2023-03-19 ENCOUNTER — Ambulatory Visit: Payer: Self-pay | Admitting: Physician Assistant

## 2023-03-19 VITALS — BP 147/86 | HR 82 | Resp 14 | Ht 73.0 in | Wt 350.0 lb

## 2023-03-19 DIAGNOSIS — R0789 Other chest pain: Secondary | ICD-10-CM

## 2023-03-19 NOTE — Progress Notes (Signed)
Pt presents today with chest pain. Pt is post covid 2 weeks. He has been having intermittent chest pains since covid, but only today its been constant while radiating to right side of chest.

## 2023-03-19 NOTE — Progress Notes (Signed)
   Subjective: Chest pain    Patient ID: Jeffrey Carlson, male    DOB: May 16, 1971, 52 y.o.   MRN: [id number]  HPI Patient reports chest discomfort epigastric area radiating to the right side of his upper chest.  Denies dyspnea.  Denies vertigo or vision disturbance.  Denies weakness.  Patient was diagnosed with COVID 2-1/2 weeks ago.  Patient states he developed forceful nonproductive cough for 1 week.  Denies fever chills associated with complaint.  All signs and symptoms has resolved except for the chest discomfort.  Review of Systems GERD, hypertension, hypogonadism, and right bundle branch block.   Objective:   Physical Exam BP 147/86  Pulse 82  Resp 14  SpO2 99 %  Weight 350 lb (158.8 kg)  Height 6\' 1"  (1.854 m)   BMI 46.18 kg/m2  BSA 2.86 m2  No acute distress.  Morbid obesity. HEENT is unremarkable. Neck is supple without lymphadenopathy or bruits. Lungs are clear to auscultation. Heart is regular rate and rhythm. EKG continues to shows right bundle branch block.       Assessment & Plan: Chest pain  Further evaluation with chest x-ray.  Will follow-up once results are in.  If condition worsen advise go to the emergency room this weekend.

## 2023-03-20 ENCOUNTER — Ambulatory Visit
Admission: EM | Admit: 2023-03-20 | Discharge: 2023-03-20 | Disposition: A | Discharge status: Home / Self Care | Payer: 59 | Attending: Family Medicine | Admitting: Family Medicine

## 2023-03-20 ENCOUNTER — Encounter: Payer: Self-pay | Admitting: Emergency Medicine

## 2023-03-20 DIAGNOSIS — R21 Rash and other nonspecific skin eruption: Secondary | ICD-10-CM

## 2023-03-20 MED ORDER — DOXYCYCLINE HYCLATE 100 MG PO CAPS: 100.0000 mg | ORAL_CAPSULE | Freq: Two times a day (BID) | ORAL | 0 refills | Status: DC

## 2023-03-20 MED ORDER — TRIAMCINOLONE ACETONIDE 0.1 % EX OINT: 1.0000 | TOPICAL_OINTMENT | Freq: Two times a day (BID) | CUTANEOUS | 0 refills | Status: DC

## 2023-03-20 MED ORDER — DEXAMETHASONE SODIUM PHOSPHATE 10 MG/ML IJ SOLN
10.0000 mg | Freq: Once | INTRAMUSCULAR | Status: AC
  Administered 2023-03-20: 10 mg via INTRAMUSCULAR

## 2023-03-20 MED ORDER — PREDNISONE 10 MG (21) PO TBPK: ORAL_TABLET | Freq: Every day | ORAL | 0 refills | Status: DC

## 2023-03-20 NOTE — ED Triage Notes (Signed)
Patient developed red rash on his left foot that started yesterday.  Patient reports itching.  Patient has 2 blisters on top of his left foot and swelling.  Patient put Calamine lotion on his foot.

## 2023-03-20 NOTE — ED Provider Notes (Signed)
[facility]    CSN: [id number] Arrival date & time: 03/20/23  0835      History   Chief Complaint Chief Complaint  Patient presents with   Rash    Left foot    HPI Jeffrey Carlson is a 52 y.o. male.   HPI  Jeffrey Carlson presents for left foot rash that started yesterday.  He has noticed 2 blisters, the area is red and swollen.  He has been putting over-the-counter Ivyrest with calamine lotion on it.  Denies any contact with poisonous plants.  States he usually only wears boots, does not wear any flip-flops or walk barefoot unless he is inside his home.  Believes he may have been bitten by a bug.  Had a spider bite on his neck a few weeks ago.   Jeffrey Carlson has otherwise been well and has no other concerns.    Past Medical History:  Diagnosis Date   Adult ADHD    Arthritis    Barrett esophagus    Colon polyps    Degenerative joint disease    GERD (gastroesophageal reflux disease)    Hiatal hernia    Hypertension    dr Dorothey Baseman   [place]   Leukocytosis    Obesity    Polycythemia, secondary 12/20/2014   Sleep apnea    cpap   Testosterone deficiency    Undescended right testicle     Patient Active Problem List   Diagnosis Date Noted   DJD of left AC (acromioclavicular) joint 10/20/2022   Rotator cuff tendinitis, left 10/20/2022   Hypophosphatemia 09/26/2019   Iron deficiency anemia 02/15/2019   Hypogonadism in male 02/15/2019   Essential hypertension 02/15/2019   Gastroesophageal reflux disease 02/15/2019   History of colonic polyps 02/15/2019   Attention deficit hyperactivity disorder (ADHD) 02/15/2019   Obesity, Class III, BMI 40-49.9 (morbid obesity) (HCC) 07/19/2018   Osteoarthritis of knee 09/08/2017   Degeneration of lumbosacral intervertebral disc 03/25/2016   Displacement of lumbar intervertebral disc without myelopathy 11/22/2015   Lumbar radiculopathy 10/30/2015   Atypical chest pain 01/18/2015   Bundle branch block, right 01/18/2015   Breath  shortness 01/18/2015   Polycythemia, secondary 12/20/2014   Rotator cuff tear 10/10/2013   Backache 12/22/2012    Past Surgical History:  Procedure Laterality Date   APPENDECTOMY     BACK SURGERY     CHOLECYSTECTOMY     COLONOSCOPY  06/2010   + small bowel capsule study   EYE SURGERY     childhood   KNEE SURGERY     ROTATOR CUFF REPAIR     rt shoulder       Home Medications    Prior to Admission medications   Medication Sig Start Date End Date Taking? Authorizing Provider  amphetamine-dextroamphetamine (ADDERALL XR) 20 MG 24 hr capsule Take 1 capsule (20 mg total) by mouth daily. 02/11/23  Yes Joni Reining, PA-C  doxycycline (VIBRAMYCIN) 100 MG capsule Take 1 capsule (100 mg total) by mouth 2 (two) times daily. 03/20/23  Yes Jhett Fretwell, DO  predniSONE (STERAPRED UNI-PAK 21 TAB) 10 MG (21) TBPK tablet Take by mouth daily. Take 6 tabs by mouth daily for 1, then 5 tabs for 1 day, then 4 tabs for 1 day, then 3 tabs for 1 day, then 2 tabs for 1 day, then 1 tab for 1 day. 03/20/23  Yes Milik Gilreath, DO  tamsulosin (FLOMAX) 0.4 MG CAPS capsule Take 1 capsule (0.4 mg total) by mouth daily. 10/14/22  Yes  Joni Reining, PA-C  triamcinolone ointment (KENALOG) 0.1 % Apply 1 Application topically 2 (two) times daily. 03/20/23  Yes Jarett Dralle, [place] Meth, DO  clotrimazole-betamethasone (LOTRISONE) cream Apply 1 Application topically 2 (two) times daily. 02/03/23   Felecia Shelling, DPM  cyclobenzaprine (FLEXERIL) 5 MG tablet Take 1 tablet (5 mg total) by mouth 3 (three) times daily as needed for muscle spasms. Patient not taking: Reported on 08/14/2022 11/20/20   Viviano Simas, FNP  econazole nitrate 1 % cream Apply topically 2 (two) times daily. 09/17/21   Joni Reining, PA-C  esomeprazole (NEXIUM) 40 MG capsule Take 40 mg by mouth daily at 12 noon.    [provider]  gabapentin (NEURONTIN) 300 MG capsule Take by mouth. 12/10/21   [provider]  hydrOXYzine (VISTARIL) 25 MG  capsule Take 1 capsule (25 mg total) by mouth every 8 (eight) hours as needed. 02/23/23   Joni Reining, PA-C  lisinopril (ZESTRIL) 20 MG tablet Take 1 tablet (20 mg total) by mouth daily. 07/17/22   Joni Reining, PA-C  Multiple Vitamins-Minerals (MULTIVITAMIN ADULTS PO) Take 1 tablet by mouth daily at 6 PM.    [provider]  nabumetone (RELAFEN) 500 MG tablet Take 1 tablet by mouth 2 (two) times daily. 10/20/22 10/20/23  [provider]  orphenadrine (NORFLEX) 100 MG tablet Take 1 tablet (100 mg total) by mouth 2 (two) times daily. Patient not taking: Reported on 08/14/2022 03/11/22   Joni Reining, PA-C  terbinafine (LAMISIL) 250 MG tablet Take 1 tablet (250 mg total) by mouth daily. 02/03/23   Felecia Shelling, DPM  testosterone cypionate (DEPOTESTOSTERONE CYPIONATE) 200 MG/ML injection Inject 0.5 mLs (100 mg total) into the muscle every 14 (fourteen) days. 07/17/22   Joni Reining, PA-C  tiZANidine (ZANAFLEX) 4 MG tablet Take by mouth. Patient not taking: Reported on 02/03/2023 05/06/22   [provider]    Family History Family History  Problem Relation Age of Onset   Thyroid cancer Father    Rheum arthritis Mother    Abnormal EKG Mother    Colon cancer Paternal Grandmother     Social History Social History   Tobacco Use   Smoking status: Never   Smokeless tobacco: Former    Types: Engineer, drilling   Vaping status: Never Used  Substance Use Topics   Alcohol use: Yes    Alcohol/week: 0.0 standard drinks of alcohol    Comment: occ   Drug use: No     Allergies   Patient has no known allergies.   Review of Systems Review of Systems :negative unless otherwise stated in HPI.      Physical Exam Triage Vital Signs ED Triage Vitals  Encounter Vitals Group     BP 03/20/23 0904 (!) 142/88     Systolic BP Percentile --      Diastolic BP Percentile --      Pulse Rate 03/20/23 0904 88     Resp 03/20/23 0904 15     Temp 03/20/23 0904 98.6 F (37 C)      Temp Source 03/20/23 0904 Oral     SpO2 03/20/23 0904 98 %     Weight 03/20/23 0902 (!) 350 lb 1.5 oz (158.8 kg)     Height 03/20/23 0902 6\' 1"  (1.854 m)     Head Circumference --      Peak Flow --      Pain Score 03/20/23 0902 6     Pain  Loc --      Pain Education --      Exclude from Growth Chart --    No data found.  Updated Vital Signs BP (!) 142/88 (BP Location: Right Arm)   Pulse 88   Temp 98.6 F (37 C) (Oral)   Resp 15   Ht 6\' 1"  (1.854 m)   Wt (!) 158.8 kg   SpO2 98%   BMI 46.19 kg/m   Visual Acuity Right Eye Distance:   Left Eye Distance:   Bilateral Distance:    Right Eye Near:   Left Eye Near:    Bilateral Near:     Physical Exam  GEN: alert, well appearing male, in no acute distress  EYES: extra occular movements intact, no scleral injection CV: regular rate, strong DP pulse RESP: no increased work of breathing MSK: normal movement of foot, toes and ankle SKIN: Left foot erythema with warmth, 2 dime-sized blisters on anterolateral foot    UC Treatments / Results  Labs (all labs ordered are listed, but only abnormal results are displayed) Labs Reviewed - No data to display  EKG   Radiology No results found.  Procedures Procedures (including critical care time)  Medications Ordered in UC Medications  dexamethasone (DECADRON) injection 10 mg (10 mg Intramuscular Given 03/20/23 0942)    Initial Impression / Assessment and Plan / UC Course  I have reviewed the triage vital signs and the nursing notes.  Pertinent labs & imaging results that were available during my care of the patient were reviewed by me and considered in my medical decision making (see chart for details).     Patient is a 52 y.o. malewho presents for rash on dorsum if foot.  Overall, patient is well-appearing and well-hydrated.  Vital signs stable.  Javaree is afebrile.  History is not concerning for contact with poisonous plants.  He has blistering erythema and warmth  on exam.  Considered contact dermatitis vs ant bite therefore given Decadron 10 mg IM and steroid taper for home.  Possibly spider bite sequela vs MRSA cellulitis and given the erythema, warmth and blistering from swelling.  Patient concerned and his dad almost died from MRSA.  Will prescribe doxycycline but patient did not start after 24 hours to see if the Decadron will help his symptoms.   Reviewed expectations regarding course of current medical issues.  All questions asked were answered.  Outlined signs and symptoms indicating need for more acute intervention. Patient verbalized understanding. After Visit Summary given.   Final Clinical Impressions(s) / UC Diagnoses   Final diagnoses:  Rash     Discharge Instructions      Stop by the pharmacy to pick up your prescriptions.  Follow up with your primary care provider as needed.  If you have streaking up your leg after taking 4 pills, go to the emergency department as there is concern for more severe infection and may need antibiotics.     ED Prescriptions     Medication Sig Dispense Auth. Provider   doxycycline (VIBRAMYCIN) 100 MG capsule Take 1 capsule (100 mg total) by mouth 2 (two) times daily. 20 capsule Devyn Sheerin, DO   predniSONE (STERAPRED UNI-PAK 21 TAB) 10 MG (21) TBPK tablet Take by mouth daily. Take 6 tabs by mouth daily for 1, then 5 tabs for 1 day, then 4 tabs for 1 day, then 3 tabs for 1 day, then 2 tabs for 1 day, then 1 tab for 1 day. 21 tablet Katha Cabal,  DO   triamcinolone ointment (KENALOG) 0.1 % Apply 1 Application topically 2 (two) times daily. 15 g Katha Cabal, DO      PDMP not reviewed this encounter.              Katha Cabal, DO 03/20/23 [id number]

## 2023-03-20 NOTE — Discharge Instructions (Addendum)
Stop by the pharmacy to pick up your prescriptions.  Follow up with your primary care provider as needed.  If you have streaking up your leg after taking 4 pills, go to the emergency department as there is concern for more severe infection and may need antibiotics.

## 2023-03-23 ENCOUNTER — Other Ambulatory Visit: Payer: Self-pay

## 2023-03-23 DIAGNOSIS — F988 Other specified behavioral and emotional disorders with onset usually occurring in childhood and adolescence: Secondary | ICD-10-CM

## 2023-03-23 MED ORDER — AMPHETAMINE-DEXTROAMPHET ER 20 MG PO CP24
20.0000 mg | ORAL_CAPSULE | Freq: Every day | ORAL | 0 refills | Status: DC
Start: 2023-03-23 — End: 2023-05-05

## 2023-03-25 ENCOUNTER — Ambulatory Visit: Payer: Self-pay | Admitting: Physician Assistant

## 2023-03-25 ENCOUNTER — Encounter: Payer: Self-pay | Admitting: Physician Assistant

## 2023-03-25 DIAGNOSIS — W57XXXD Bitten or stung by nonvenomous insect and other nonvenomous arthropods, subsequent encounter: Secondary | ICD-10-CM

## 2023-03-25 NOTE — Progress Notes (Signed)
   Subjective: Infected insect bite    Patient ID: Jeffrey Carlson, male    DOB: August 27, 1970, 52 y.o.   MRN: [id number]  HPI Patient follow-up from urgent care clinic where he was seen on 03/20/2023 for 2 blisters on the dorsal aspect of the third and fourth digit left foot.  Patient denies contact with plants.  Unsure insect bite.  States when lesion appeared there was intense itching.  Patient was given Decadron 10 mg IM followed by a prescription for Medrol Dosepak and doxycycline.  Patient stated one of the blisters has ruptured and he continues to have mild pain and redness to the dorsal aspect of his foot.   Review of Systems GERD, hypertension, and hypogonadism.    Objective:   Physical Exam Examination of the left foot reveals mild erythema with 1 intact blister lesion dorsal aspect of left foot.  Mild erythema surrounding ruptured blister.       Assessment & Plan: Infected insect bite  Patient advised to continue previous medications and follow-up if no improvement in 1 week.  Return back to clinic if condition worsens.

## 2023-03-25 NOTE — Progress Notes (Signed)
Pt presents today for follow up on contact derm from ED 03/20/23

## 2023-04-09 ENCOUNTER — Encounter: Payer: Self-pay | Admitting: Gastroenterology

## 2023-04-10 ENCOUNTER — Ambulatory Visit: Payer: 59 | Admitting: Certified Registered"

## 2023-04-10 ENCOUNTER — Ambulatory Visit
Admission: RE | Admit: 2023-04-10 | Discharge: 2023-04-10 | Disposition: A | Discharge status: Home / Self Care | Payer: 59 | Attending: Gastroenterology | Admitting: Gastroenterology

## 2023-04-10 ENCOUNTER — Encounter: Payer: Self-pay | Admitting: Gastroenterology

## 2023-04-10 ENCOUNTER — Other Ambulatory Visit: Payer: Self-pay

## 2023-04-10 ENCOUNTER — Encounter
Admission: RE | Disposition: A | Discharge status: Home / Self Care | Payer: Self-pay | Source: Home / Self Care | Attending: Gastroenterology

## 2023-04-10 DIAGNOSIS — K5730 Diverticulosis of large intestine without perforation or abscess without bleeding: Secondary | ICD-10-CM | POA: Diagnosis not present

## 2023-04-10 DIAGNOSIS — Z86010 Personal history of colonic polyps: Secondary | ICD-10-CM | POA: Diagnosis not present

## 2023-04-10 DIAGNOSIS — Z1211 Encounter for screening for malignant neoplasm of colon: Secondary | ICD-10-CM | POA: Insufficient documentation

## 2023-04-10 DIAGNOSIS — K640 First degree hemorrhoids: Secondary | ICD-10-CM | POA: Diagnosis not present

## 2023-04-10 DIAGNOSIS — Z87891 Personal history of nicotine dependence: Secondary | ICD-10-CM | POA: Diagnosis not present

## 2023-04-10 DIAGNOSIS — I10 Essential (primary) hypertension: Secondary | ICD-10-CM | POA: Diagnosis not present

## 2023-04-10 DIAGNOSIS — Z09 Encounter for follow-up examination after completed treatment for conditions other than malignant neoplasm: Secondary | ICD-10-CM | POA: Diagnosis not present

## 2023-04-10 DIAGNOSIS — K649 Unspecified hemorrhoids: Secondary | ICD-10-CM | POA: Diagnosis not present

## 2023-04-10 HISTORY — PX: COLONOSCOPY WITH PROPOFOL: SHX5780

## 2023-04-10 SURGERY — COLONOSCOPY WITH PROPOFOL
Anesthesia: General

## 2023-04-10 MED ORDER — PROPOFOL 10 MG/ML IV BOLUS
INTRAVENOUS | Status: AC
  Filled 2023-04-10: qty 40

## 2023-04-10 MED ORDER — LIDOCAINE HCL (CARDIAC) PF 100 MG/5ML IV SOSY
PREFILLED_SYRINGE | INTRAVENOUS | Status: DC | PRN
  Administered 2023-04-10: 100 mg via INTRAVENOUS

## 2023-04-10 MED ORDER — SODIUM CHLORIDE 0.9 % IV SOLN: INTRAVENOUS | Status: DC

## 2023-04-10 MED ORDER — GLYCOPYRROLATE 0.2 MG/ML IJ SOLN
INTRAMUSCULAR | Status: DC | PRN
Start: 2023-04-10 — End: 2023-04-10
  Administered 2023-04-10: .1 mg via INTRAVENOUS

## 2023-04-10 MED ORDER — PROPOFOL 10 MG/ML IV BOLUS
INTRAVENOUS | Status: DC | PRN
  Administered 2023-04-10: 80 ug/kg/min via INTRAVENOUS
  Administered 2023-04-10: 150 mg via INTRAVENOUS

## 2023-04-10 MED ORDER — DEXMEDETOMIDINE HCL IN NACL 80 MCG/20ML IV SOLN
INTRAVENOUS | Status: DC | PRN
Start: 2023-04-10 — End: 2023-04-10
  Administered 2023-04-10: 8 ug via INTRAVENOUS

## 2023-04-10 MED ORDER — LIDOCAINE HCL (PF) 2 % IJ SOLN
INTRAMUSCULAR | Status: AC
  Filled 2023-04-10: qty 5

## 2023-04-10 NOTE — Interval H&P Note (Signed)
History and Physical Interval Note: Preprocedure H&P from 04/10/23  was reviewed and there was no interval change after seeing and examining the patient.  Written consent was obtained from the patient after discussion of risks, benefits, and alternatives. Patient has consented to proceed with Colonoscopy with possible intervention   04/10/2023 10:48 AM  Jeffrey Carlson  has presented today for surgery, with the diagnosis of V12.72 (ICD-9-CM) - Z86.010 (ICD-10-CM) - History of colon polyps.  The various methods of treatment have been discussed with the patient and family. After consideration of risks, benefits and other options for treatment, the patient has consented to  Procedure(s): COLONOSCOPY WITH PROPOFOL (N/A) as a surgical intervention.  The patient's history has been reviewed, patient examined, no change in status, stable for surgery.  I have reviewed the patient's chart and labs.  Questions were answered to the patient's satisfaction.     Jaynie Collins

## 2023-04-10 NOTE — H&P (Addendum)
Pre-Procedure H&P   Patient ID: Jeffrey Carlson is a 52 y.o. male.  Gastroenterology Provider: Jaynie Collins, DO  Referring Provider: Tawni Pummel, PA PCP: Wells Guiles  Date: 04/10/2023  HPI Jeffrey Carlson is a 52 y.o. male who presents today for Colonoscopy for surveillance- phx colon polyps.  Every other day bowel movement without melena or hematochezia  Last underwent colonoscopy in December 2019 with left-sided diverticulosis.  2011 colonoscopy also negative.  1 adenomatous polyp in May 2003. Colon reported to be redundant on previous colonoscopy  Father with history of colon polyps.  Paternal grandmother with history of colorectal cancer in her 16s  Creatinine 1.08 hemoglobin 16.4 MCV 82 platelets 345,000   Past Medical History:  Diagnosis Date   Adult ADHD    Arthritis    Barrett esophagus    Colon polyps    Degenerative joint disease    GERD (gastroesophageal reflux disease)    Hiatal hernia    Hypertension    dr Dorothey Baseman   [place]   Leukocytosis    Obesity    Polycythemia, secondary 12/20/2014   Sleep apnea    cpap   Testosterone deficiency    Undescended right testicle     Past Surgical History:  Procedure Laterality Date   APPENDECTOMY     BACK SURGERY     CHOLECYSTECTOMY     COLONOSCOPY  06/2010   + small bowel capsule study   EYE SURGERY     childhood   KNEE SURGERY     ROTATOR CUFF REPAIR     rt shoulder    Family History Father- colon polyps; pgm crc 60s No h/o GI disease or malignancy  Review of Systems  Constitutional:  Negative for activity change, appetite change, chills, diaphoresis, fatigue, fever and unexpected weight change.  HENT:  Negative for trouble swallowing and voice change.   Respiratory:  Negative for shortness of breath and wheezing.   Cardiovascular:  Negative for chest pain, palpitations and leg swelling.  Gastrointestinal:  Negative for abdominal distention, abdominal pain, anal  bleeding, blood in stool, constipation, diarrhea, nausea and vomiting.  Musculoskeletal:  Negative for arthralgias and myalgias.  Skin:  Negative for color change and pallor.  Neurological:  Negative for dizziness, syncope and weakness.  Psychiatric/Behavioral:  Negative for confusion. The patient is not nervous/anxious.   All other systems reviewed and are negative.    Medications No current facility-administered medications on file prior to encounter.   Current Outpatient Medications on File Prior to Encounter  Medication Sig Dispense Refill   esomeprazole (NEXIUM) 40 MG capsule Take 40 mg by mouth daily at 12 noon.     gabapentin (NEURONTIN) 300 MG capsule Take by mouth.     lisinopril (ZESTRIL) 20 MG tablet Take 1 tablet (20 mg total) by mouth daily. 90 tablet 3   nabumetone (RELAFEN) 500 MG tablet Take 1 tablet by mouth 2 (two) times daily.     tamsulosin (FLOMAX) 0.4 MG CAPS capsule Take 1 capsule (0.4 mg total) by mouth daily. 90 capsule 3   cyclobenzaprine (FLEXERIL) 5 MG tablet Take 1 tablet (5 mg total) by mouth 3 (three) times daily as needed for muscle spasms. (Patient not taking: Reported on 08/14/2022) 45 tablet 0   econazole nitrate 1 % cream Apply topically 2 (two) times daily. 30 g 1   Multiple Vitamins-Minerals (MULTIVITAMIN ADULTS PO) Take 1 tablet by mouth daily at 6 PM.     orphenadrine (NORFLEX) 100  MG tablet Take 1 tablet (100 mg total) by mouth 2 (two) times daily. (Patient not taking: Reported on 08/14/2022) 10 tablet 0   testosterone cypionate (DEPOTESTOSTERONE CYPIONATE) 200 MG/ML injection Inject 0.5 mLs (100 mg total) into the muscle every 14 (fourteen) days. 10 mL 1   tiZANidine (ZANAFLEX) 4 MG tablet Take by mouth. (Patient not taking: Reported on 02/03/2023)      Pertinent medications related to GI and procedure were reviewed by me with the patient prior to the procedure   Current Facility-Administered Medications:    0.9 %  sodium chloride infusion, ,  Intravenous, Continuous, Jaynie Collins, DO, Last Rate: 20 mL/hr at 04/10/23 1048, Continued from Pre-op at 04/10/23 1048  sodium chloride 20 mL/hr at 04/10/23 1048       No Known Allergies Allergies were reviewed by me prior to the procedure  Objective   Body mass index is 47.58 kg/m. Vitals:   04/10/23 1041  BP: (!) 140/77  Pulse: 95  Resp: 16  Temp: (!) 96.7 F (35.9 C)  TempSrc: Temporal  SpO2: 100%  Weight: (!) 159.1 kg  Height: 6' (1.829 m)     Physical Exam Vitals and nursing note reviewed.  Constitutional:      General: He is not in acute distress.    Appearance: Normal appearance. He is obese. He is not ill-appearing, toxic-appearing or diaphoretic.  HENT:     Head: Normocephalic and atraumatic.     Nose: Nose normal.     Mouth/Throat:     Mouth: Mucous membranes are moist.     Pharynx: Oropharynx is clear.  Eyes:     General: No scleral icterus.    Extraocular Movements: Extraocular movements intact.  Cardiovascular:     Rate and Rhythm: Normal rate and regular rhythm.     Heart sounds: Normal heart sounds. No murmur heard.    No friction rub. No gallop.  Pulmonary:     Effort: Pulmonary effort is normal. No respiratory distress.     Breath sounds: Normal breath sounds. No wheezing, rhonchi or rales.  Abdominal:     General: Bowel sounds are normal. There is no distension.     Palpations: Abdomen is soft.     Tenderness: There is no abdominal tenderness. There is no guarding or rebound.  Musculoskeletal:     Cervical back: Neck supple.     Right lower leg: No edema.     Left lower leg: No edema.  Skin:    General: Skin is warm and dry.     Coloration: Skin is not jaundiced or pale.  Neurological:     General: No focal deficit present.     Mental Status: He is alert and oriented to person, place, and time. Mental status is at baseline.  Psychiatric:        Mood and Affect: Mood normal.        Behavior: Behavior normal.        Thought  Content: Thought content normal.        Judgment: Judgment normal.      Assessment:  Mr. Jeffrey Carlson is a 52 y.o. male  who presents today for Colonoscopy for surveillance- phx colon polyps .  Plan:  Colonoscopy with possible intervention today  Colonoscopy with possible biopsy, control of bleeding, polypectomy, and interventions as necessary has been discussed with the patient/patient representative. Informed consent was obtained from the patient/patient representative after explaining the indication, nature, and risks of the procedure including but  not limited to death, bleeding, perforation, missed neoplasm/lesions, cardiorespiratory compromise, and reaction to medications. Opportunity for questions was given and appropriate answers were provided. Patient/patient representative has verbalized understanding is amenable to undergoing the procedure.   Jaynie Collins, DO  [facility]  Portions of the record may have been created with voice recognition software. Occasional wrong-word or 'sound-a-like' substitutions may have occurred due to the inherent limitations of voice recognition software.  Read the chart carefully and recognize, using context, where substitutions may have occurred.

## 2023-04-10 NOTE — Op Note (Signed)
[facility] Gastroenterology Patient Name: Jeffrey Carlson Procedure Date: 04/10/2023 10:28 AM MRN: [id number] Account #: [id number] Date of Birth: [birth year] Admit Type: Outpatient Age: 52 Room: [facility] ENDO ROOM 1 Gender: Male Note Status: Finalized Instrument Name: Colonoscope [id number] Procedure:             Colonoscopy Indications:           High risk colon cancer surveillance: Personal history                         of colonic polyps Providers:             Trenda Moots, DO Referring MD:          Joni Reining, MD (Referring MD) Medicines:             Monitored Anesthesia Care Complications:         No immediate complications. Estimated blood loss: None. Procedure:             Pre-Anesthesia Assessment:                        - Prior to the procedure, a History and Physical was                         performed, and patient medications and allergies were                         reviewed. The patient is competent. The risks and                         benefits of the procedure and the sedation options and                         risks were discussed with the patient. All questions                         were answered and informed consent was obtained.                         Patient identification and proposed procedure were                         verified by the physician, the nurse, the anesthetist                         and the technician in the endoscopy suite. Mental                         Status Examination: alert and oriented. Airway                         Examination: normal oropharyngeal airway and neck                         mobility. Respiratory Examination: clear to                         auscultation. CV Examination: RRR, no murmurs, no S3  or S4. Prophylactic Antibiotics: The patient does not                         require prophylactic antibiotics. Prior                         Anticoagulants: The patient has  taken no anticoagulant                         or antiplatelet agents. ASA Grade Assessment: III - A                         patient with severe systemic disease. After reviewing                         the risks and benefits, the patient was deemed in                         satisfactory condition to undergo the procedure. The                         anesthesia plan was to use monitored anesthesia care                         (MAC). Immediately prior to administration of                         medications, the patient was re-assessed for adequacy                         to receive sedatives. The heart rate, respiratory                         rate, oxygen saturations, blood pressure, adequacy of                         pulmonary ventilation, and response to care were                         monitored throughout the procedure. The physical                         status of the patient was re-assessed after the                         procedure.                        After obtaining informed consent, the colonoscope was                         passed under direct vision. Throughout the procedure,                         the patient's blood pressure, pulse, and oxygen                         saturations were monitored continuously. The  Colonoscope was introduced through the anus and                         advanced to the the terminal ileum, with                         identification of the appendiceal orifice and IC                         valve. The colonoscopy was somewhat difficult due to                         multiple diverticula in the colon and the patient's                         body habitus. Successful completion of the procedure                         was aided by straightening and shortening the scope to                         obtain bowel loop reduction, using scope torsion,                         applying abdominal pressure, lavage and receiving                          assistance from additional staff. The patient                         tolerated the procedure well. The quality of the bowel                         preparation was evaluated using the BBPS [facility] Bowel                         Preparation Scale) with scores of: Right Colon = 2                         (minor amount of residual staining, small fragments of                         stool and/or opaque liquid, but mucosa seen well),                         Transverse Colon = 3 (entire mucosa seen well with no                         residual staining, small fragments of stool or opaque                         liquid) and Left Colon = 2 (minor amount of residual                         staining, small fragments of stool and/or opaque  liquid, but mucosa seen well). The total BBPS score                         equals 7. The quality of the bowel preparation was                         good. The terminal ileum, ileocecal valve, appendiceal                         orifice, and rectum were photographed. Findings:      The perianal and digital rectal examinations were normal. Pertinent       negatives include normal sphincter tone.      Multiple small-mouthed diverticula were found in the left colon.       Estimated blood loss: none.      Non-bleeding internal hemorrhoids were found during retroflexion. The       hemorrhoids were Grade I (internal hemorrhoids that do not prolapse).       Estimated blood loss: none.      The entire examined colon appeared normal on direct and retroflexion       views.      The terminal ileum appeared normal. Estimated blood loss: none. Impression:            - Diverticulosis in the left colon.                        - Non-bleeding internal hemorrhoids.                        - The entire examined colon is normal on direct and                         retroflexion views.                        - The examined portion of  the ileum was normal.                        - No specimens collected. Recommendation:        - Patient has a contact number available for                         emergencies. The signs and symptoms of potential                         delayed complications were discussed with the patient.                         Return to normal activities tomorrow. Written                         discharge instructions were provided to the patient.                        - Discharge patient to home.                        - Resume previous diet.                        -  Continue present medications.                        - Repeat colonoscopy in 5 years for surveillance.                        - Return to referring physician as previously                         scheduled.                        - The findings and recommendations were discussed with                         the patient. Procedure Code(s):     --- Professional ---                        [phone], Colonoscopy, flexible; diagnostic, including                         collection of specimen(s) by brushing or washing, when                         performed (separate procedure) Diagnosis Code(s):     --- Professional ---                        Z86.010, Personal history of colonic polyps                        K64.0, First degree hemorrhoids                        K57.30, Diverticulosis of large intestine without                         perforation or abscess without bleeding CPT copyright 2022 American Medical Association. All rights reserved. The codes documented in this report are preliminary and upon coder review may  be revised to meet current compliance requirements. Attending Participation:      I personally performed the entire procedure. Elfredia Nevins, DO Jaynie Collins DO, DO 04/10/2023 11:30:12 AM This report has been signed electronically. Number of Addenda: 0 Note Initiated On: 04/10/2023 10:28 AM Scope Withdrawal Time: 0  hours 12 minutes 3 seconds  Total Procedure Duration: 0 hours 24 minutes 14 seconds  Estimated Blood Loss:  Estimated blood loss: none.      [facility]

## 2023-04-10 NOTE — Anesthesia Postprocedure Evaluation (Signed)
Anesthesia Post Note  Patient: Jeffrey Carlson  Procedure(s) Performed: COLONOSCOPY WITH PROPOFOL  Patient location during evaluation: Endoscopy Anesthesia Type: General Level of consciousness: awake and alert Pain management: pain level controlled Vital Signs Assessment: post-procedure vital signs reviewed and stable Respiratory status: spontaneous breathing, nonlabored ventilation, respiratory function stable and patient connected to nasal cannula oxygen Cardiovascular status: blood pressure returned to baseline and stable Postop Assessment: no apparent nausea or vomiting Anesthetic complications: no  No notable events documented.   Last Vitals:  Vitals:   04/10/23 1124 04/10/23 1135  BP: 107/60 125/75  Pulse:  94  Resp: 15 17  Temp:    SpO2: 96% 99%    Last Pain:  Vitals:   04/10/23 1135  TempSrc:   PainSc: 0-No pain                 Stephanie Coup

## 2023-04-10 NOTE — Transfer of Care (Signed)
Immediate Anesthesia Transfer of Care Note  Patient: Jeffrey Carlson  Procedure(s) Performed: COLONOSCOPY WITH PROPOFOL  Patient Location: Endoscopy Unit  Anesthesia Type:General  Level of Consciousness: awake, alert , and drowsy  Airway & Oxygen Therapy: Patient Spontanous Breathing  Post-op Assessment: Report given to RN and Post -op Vital signs reviewed and stable  Post vital signs: Reviewed and stable  Last Vitals:  Vitals Value Taken Time  BP 107/60 04/10/23 1124  Temp 35.9 1124  Pulse 91 04/10/23 1125  Resp 22 04/10/23 1125  SpO2 96 % 04/10/23 1125  Vitals shown include unfiled device data.  Last Pain:  Vitals:   04/10/23 1041  TempSrc: Temporal  PainSc: 0-No pain         Complications: No notable events documented.

## 2023-04-10 NOTE — Anesthesia Preprocedure Evaluation (Signed)
Anesthesia Evaluation  Patient identified by MRN, date of birth, ID band Patient awake    Reviewed: Allergy & Precautions, NPO status , Patient's Chart, lab work & pertinent test results  Airway Mallampati: III  TM Distance: >3 FB Neck ROM: full    Dental  (+) Chipped   Pulmonary sleep apnea and Continuous Positive Airway Pressure Ventilation    Pulmonary exam normal        Cardiovascular hypertension, negative cardio ROS Normal cardiovascular exam     Neuro/Psych negative neurological ROS  negative psych ROS   GI/Hepatic Neg liver ROS,GERD  Medicated and Controlled,,  Endo/Other    Morbid obesity  Renal/GU negative Renal ROS  negative genitourinary   Musculoskeletal   Abdominal   Peds  Hematology negative hematology ROS (+)   Anesthesia Other Findings Past Medical History: No date: Adult ADHD No date: Arthritis No date: Barrett esophagus No date: Colon polyps No date: Degenerative joint disease No date: GERD (gastroesophageal reflux disease) No date: Hiatal hernia No date: Hypertension     Comment:  dr Dorothey Baseman   [place] No date: Leukocytosis No date: Obesity 12/20/2014: Polycythemia, secondary No date: Sleep apnea     Comment:  cpap No date: Testosterone deficiency No date: Undescended right testicle  Past Surgical History: No date: APPENDECTOMY No date: BACK SURGERY No date: CHOLECYSTECTOMY 06/2010: COLONOSCOPY     Comment:  + small bowel capsule study No date: EYE SURGERY     Comment:  childhood No date: KNEE SURGERY No date: ROTATOR CUFF REPAIR     Comment:  rt shoulder  BMI    Body Mass Index: 47.58 kg/m      Reproductive/Obstetrics negative OB ROS                             Anesthesia Physical Anesthesia Plan  ASA: 3  Anesthesia Plan: General   Post-op Pain Management: Minimal or no pain anticipated   Induction: Intravenous  PONV Risk Score and  Plan: 3 and Propofol infusion, TIVA and Ondansetron  Airway Management Planned: Nasal Cannula  Additional Equipment: None  Intra-op Plan:   Post-operative Plan:   Informed Consent: I have reviewed the patients History and Physical, chart, labs and discussed the procedure including the risks, benefits and alternatives for the proposed anesthesia with the patient or authorized representative who has indicated his/her understanding and acceptance.     Dental advisory given  Plan Discussed with: CRNA and Surgeon  Anesthesia Plan Comments: (Discussed risks of anesthesia with patient, including possibility of difficulty with spontaneous ventilation under anesthesia necessitating airway intervention, PONV, and rare risks such as cardiac or respiratory or neurological events, and allergic reactions. Discussed the role of CRNA in patient's perioperative care. Patient understands.)       Anesthesia Quick Evaluation

## 2023-04-13 ENCOUNTER — Encounter: Payer: Self-pay | Admitting: Gastroenterology

## 2023-04-20 ENCOUNTER — Ambulatory Visit: Payer: Self-pay

## 2023-04-20 DIAGNOSIS — E291 Testicular hypofunction: Secondary | ICD-10-CM

## 2023-04-20 NOTE — Progress Notes (Signed)
Pt received bi weekly injection, pt tolerated well. Left glute.

## 2023-05-05 ENCOUNTER — Ambulatory Visit: Payer: Self-pay

## 2023-05-05 ENCOUNTER — Other Ambulatory Visit: Payer: Self-pay

## 2023-05-05 DIAGNOSIS — E291 Testicular hypofunction: Secondary | ICD-10-CM

## 2023-05-05 DIAGNOSIS — F988 Other specified behavioral and emotional disorders with onset usually occurring in childhood and adolescence: Secondary | ICD-10-CM

## 2023-05-05 MED ORDER — AMPHETAMINE-DEXTROAMPHET ER 20 MG PO CP24: 20.0000 mg | ORAL_CAPSULE | Freq: Every day | ORAL | 0 refills | Status: DC

## 2023-05-05 NOTE — Progress Notes (Signed)
Pt presents today to complete testosterone injection. Jeffrey Carlson

## 2023-05-29 ENCOUNTER — Ambulatory Visit: Payer: Self-pay

## 2023-05-29 DIAGNOSIS — E291 Testicular hypofunction: Secondary | ICD-10-CM

## 2023-06-02 ENCOUNTER — Other Ambulatory Visit: Payer: Self-pay

## 2023-06-02 DIAGNOSIS — F988 Other specified behavioral and emotional disorders with onset usually occurring in childhood and adolescence: Secondary | ICD-10-CM

## 2023-06-03 MED ORDER — AMPHETAMINE-DEXTROAMPHET ER 20 MG PO CP24
20.0000 mg | ORAL_CAPSULE | Freq: Every day | ORAL | 0 refills | Status: DC
Start: 2023-06-03 — End: 2023-07-17

## 2023-06-19 ENCOUNTER — Ambulatory Visit: Payer: Self-pay

## 2023-06-19 DIAGNOSIS — E291 Testicular hypofunction: Secondary | ICD-10-CM

## 2023-07-13 ENCOUNTER — Ambulatory Visit: Payer: Self-pay

## 2023-07-13 DIAGNOSIS — E291 Testicular hypofunction: Secondary | ICD-10-CM

## 2023-07-13 NOTE — Progress Notes (Signed)
Pt completed testosterone injection./CL,RMA

## 2023-07-16 ENCOUNTER — Other Ambulatory Visit: Payer: Self-pay | Admitting: Physician Assistant

## 2023-07-16 DIAGNOSIS — I10 Essential (primary) hypertension: Secondary | ICD-10-CM

## 2023-07-17 ENCOUNTER — Other Ambulatory Visit: Payer: Self-pay

## 2023-07-17 DIAGNOSIS — F988 Other specified behavioral and emotional disorders with onset usually occurring in childhood and adolescence: Secondary | ICD-10-CM

## 2023-07-20 MED ORDER — AMPHETAMINE-DEXTROAMPHET ER 20 MG PO CP24: 20.0000 mg | ORAL_CAPSULE | Freq: Every day | ORAL | 0 refills | Status: DC

## 2023-07-23 ENCOUNTER — Encounter: Payer: Self-pay | Admitting: Physician Assistant

## 2023-07-23 ENCOUNTER — Ambulatory Visit: Payer: Self-pay | Admitting: Physician Assistant

## 2023-07-23 VITALS — BP 132/75 | HR 84 | Temp 97.2°F | Resp 16

## 2023-07-23 DIAGNOSIS — R0789 Other chest pain: Secondary | ICD-10-CM

## 2023-07-23 NOTE — Progress Notes (Signed)
 Chest discomfort off & on since September 2024es  Yesterday the pain started again - cannot figure out what's triggering it  Colonoscopy completed this past fall 2024

## 2023-07-23 NOTE — Progress Notes (Signed)
   Subjective: Intermittent chest pain    Patient ID: Jeffrey Carlson, male    DOB: 03/01/1971, 53 y.o.   MRN: [id number]  HPI Patient is 4 months history of intermittent chest pain.  Patient points to the center of his chest as a source of pain.  Denies dyspnea, weakness, or pain radiating to the left upper extremity.  Patient has a history of GERD but states this is a different sensation.  Patient had an EKG done 4 months ago which showed right bundle branch block.  Patient has not A record of his pain.  Patient is asymptomatic at this time.   Review of Systems Hypertension, GERD, hypogonadism, right bundle branch block.    Objective:   Physical Exam BP 132/75  BP Location Left Arm  Patient Position Sitting  Cuff Size Large  Pulse Rate 84  Temp 97.2 F (36.2 C)Temp. 97.2 F (36.2 C). Data is abnormal. Taken on 07/23/23 2:46 PM  Temp Source Temporal  Resp 16  SpO2 99 %  No acute distress.  Morbid obesity. HEENT Neck is supple for lymphadenopathy or bruits. Lungs are clear to auscultation. Heart is regular rate and rhythm.        Assessment & Plan:atypical chest pain  Consult to Cardiologist.

## 2023-07-24 ENCOUNTER — Ambulatory Visit
Admission: RE | Admit: 2023-07-24 | Discharge: 2023-07-24 | Disposition: A | Discharge status: Home / Self Care | Payer: 59 | Attending: Physician Assistant | Admitting: Physician Assistant

## 2023-07-24 ENCOUNTER — Ambulatory Visit
Admission: RE | Admit: 2023-07-24 | Discharge: 2023-07-24 | Disposition: A | Discharge status: Home / Self Care | Payer: 59 | Source: Ambulatory Visit | Attending: Physician Assistant | Admitting: Physician Assistant

## 2023-07-24 DIAGNOSIS — R0789 Other chest pain: Secondary | ICD-10-CM | POA: Insufficient documentation

## 2023-07-24 DIAGNOSIS — Z8616 Personal history of COVID-19: Secondary | ICD-10-CM | POA: Diagnosis not present

## 2023-07-24 DIAGNOSIS — R079 Chest pain, unspecified: Secondary | ICD-10-CM | POA: Diagnosis not present

## 2023-08-24 ENCOUNTER — Other Ambulatory Visit: Payer: Self-pay

## 2023-08-24 DIAGNOSIS — F988 Other specified behavioral and emotional disorders with onset usually occurring in childhood and adolescence: Secondary | ICD-10-CM

## 2023-08-24 MED ORDER — AMPHETAMINE-DEXTROAMPHET ER 20 MG PO CP24: 20.0000 mg | ORAL_CAPSULE | Freq: Every day | ORAL | 0 refills | Status: DC

## 2023-08-31 ENCOUNTER — Ambulatory Visit: Payer: Self-pay

## 2023-08-31 DIAGNOSIS — E291 Testicular hypofunction: Secondary | ICD-10-CM

## 2023-08-31 DIAGNOSIS — Z0000 Encounter for general adult medical examination without abnormal findings: Secondary | ICD-10-CM

## 2023-08-31 LAB — POCT URINALYSIS DIPSTICK
Bilirubin, UA: NEGATIVE
Blood, UA: NEGATIVE
Glucose, UA: NEGATIVE
Ketones, UA: NEGATIVE
Leukocytes, UA: NEGATIVE
Nitrite, UA: NEGATIVE
Protein, UA: NEGATIVE
Spec Grav, UA: 1.015 (ref 1.010–1.025)
Urobilinogen, UA: 0.2 U/dL
pH, UA: 6 (ref 5.0–8.0)

## 2023-09-03 ENCOUNTER — Other Ambulatory Visit: Payer: Self-pay

## 2023-09-03 ENCOUNTER — Encounter: Payer: Self-pay | Admitting: Physician Assistant

## 2023-09-03 ENCOUNTER — Ambulatory Visit: Payer: Self-pay | Admitting: Physician Assistant

## 2023-09-03 VITALS — BP 125/79 | HR 92 | Temp 97.5°F | Resp 16

## 2023-09-03 DIAGNOSIS — E291 Testicular hypofunction: Secondary | ICD-10-CM

## 2023-09-03 DIAGNOSIS — J0100 Acute maxillary sinusitis, unspecified: Secondary | ICD-10-CM

## 2023-09-03 MED ORDER — TESTOSTERONE CYPIONATE 200 MG/ML IM SOLN: 100.0000 mg | INTRAMUSCULAR | 0 refills | Status: DC

## 2023-09-03 MED ORDER — METHYLPREDNISOLONE 4 MG PO TBPK: ORAL_TABLET | ORAL | 0 refills | Status: DC

## 2023-09-03 MED ORDER — AMOXICILLIN 875 MG PO TABS: 875.0000 mg | ORAL_TABLET | Freq: Two times a day (BID) | ORAL | 0 refills | Status: AC

## 2023-09-03 NOTE — Addendum Note (Signed)
Addended by: Hansel Feinstein on: 09/03/2023 04:31 PM   Modules accepted: Orders

## 2023-09-03 NOTE — Progress Notes (Signed)
Stated several weeks of sinus complaints and has Allegra-D and it's effective, but not taking every day.  He's concerned over a possible sinus infection with denies of fever and cough.  Says ears tickle, but no further ear issues.

## 2023-09-03 NOTE — Addendum Note (Signed)
Addended by: Hansel Feinstein on: 09/03/2023 04:25 PM   Modules accepted: Orders

## 2023-09-03 NOTE — Progress Notes (Signed)
   Subjective: Sinus congestion    Patient ID: Jeffrey Carlson, male    DOB: 14-Jul-1971, 53 y.o.   MRN: [id number]  HPI Patient complain of 2+ weeks of sinus congestion.  Patient also states he is experiencing frontal headaches.  Denies fever chills associated with complaint.  Denies recent travel or known contact with COVID-19.   Review of Systems GERD, hypogonadism and and hypertension    Objective:   Physical Exam  BP 125/79  Pulse Rate 92  Temp 97.5 F (36.4 C)Temp. 97.5 F (36.4 C). Data is abnormal. Taken on 09/03/23 3:33 PM  Resp 16  SpO2 98 %  HEENT remarkable bilateral maxillary guarding. Neck is supple for lymphadenopathy or bruits. Lungs are clear to auscultation. Heart regular rate and rhythm.      Assessment & Plan:   Patient given a prescription for amoxicillin and Medrol Dosepak.  Patient advised to use over-the-counter Allegra-D and follow-up if there is no improvement in 1 week.

## 2023-09-05 LAB — CMP12+LP+TP+TSH+6AC+PSA+CBC…
ALT: 56 [IU]/L — ABNORMAL HIGH (ref 0–44)
AST: 42 [IU]/L — ABNORMAL HIGH (ref 0–40)
Albumin: 4.2 g/dL (ref 3.8–4.9)
Alkaline Phosphatase: 81 [IU]/L (ref 44–121)
BUN/Creatinine Ratio: 15 (ref 9–20)
BUN: 17 mg/dL (ref 6–24)
Basophils Absolute: 0.1 10*3/uL (ref 0.0–0.2)
Basos: 1 %
Bilirubin Total: 0.5 mg/dL (ref 0.0–1.2)
Calcium: 9.5 mg/dL (ref 8.7–10.2)
Chloride: 102 mmol/L (ref 96–106)
Chol/HDL Ratio: 4.3 {ratio} (ref 0.0–5.0)
Cholesterol, Total: 138 mg/dL (ref 100–199)
Creatinine, Ser: 1.11 mg/dL (ref 0.76–1.27)
EOS (ABSOLUTE): 0.3 10*3/uL (ref 0.0–0.4)
Eos: 3 %
Estimated CHD Risk: 0.8 times avg. (ref 0.0–1.0)
Free Thyroxine Index: 2.8 (ref 1.2–4.9)
GGT: 29 [IU]/L (ref 0–65)
Globulin, Total: 2.8 g/dL (ref 1.5–4.5)
Glucose: 90 mg/dL (ref 70–99)
HDL: 32 mg/dL — ABNORMAL LOW (ref >39)
Hematocrit: 49.1 % (ref 37.5–51.0)
Hemoglobin: 16.3 g/dL (ref 13.0–17.7)
Immature Grans (Abs): 0 10*3/uL (ref 0.0–0.1)
Immature Granulocytes: 0 %
Iron: 107 ug/dL (ref 38–169)
LDH: 252 [IU]/L — ABNORMAL HIGH (ref 121–224)
LDL Chol Calc (NIH): 82 mg/dL (ref 0–99)
Lymphocytes Absolute: 1.9 10*3/uL (ref 0.7–3.1)
Lymphs: 23 %
MCH: 28.3 pg (ref 26.6–33.0)
MCHC: 33.2 g/dL (ref 31.5–35.7)
MCV: 85 fL (ref 79–97)
Monocytes Absolute: 0.6 10*3/uL (ref 0.1–0.9)
Monocytes: 7 %
Neutrophils Absolute: 5.6 10*3/uL (ref 1.4–7.0)
Neutrophils: 66 %
Phosphorus: 3 mg/dL (ref 2.8–4.1)
Platelets: 244 10*3/uL (ref 150–450)
Potassium: 4.8 mmol/L (ref 3.5–5.2)
Prostate Specific Ag, Serum: 0.3 ng/mL (ref 0.0–4.0)
RBC: 5.76 x10E6/uL (ref 4.14–5.80)
RDW: 14.2 % (ref 11.6–15.4)
Sodium: 141 mmol/L (ref 134–144)
T3 Uptake Ratio: 27 % (ref 24–39)
T4, Total: 10.5 ug/dL (ref 4.5–12.0)
TSH: 2.01 u[IU]/mL (ref 0.450–4.500)
Total Protein: 7 g/dL (ref 6.0–8.5)
Triglycerides: 134 mg/dL (ref 0–149)
Uric Acid: 7.9 mg/dL (ref 3.8–8.4)
VLDL Cholesterol Cal: 24 mg/dL (ref 5–40)
WBC: 8.5 10*3/uL (ref 3.4–10.8)
eGFR: 79 mL/min/{1.73_m2} (ref >59)

## 2023-09-05 LAB — TESTOSTERONE,FREE AND TOTAL
Testosterone, Free: 1.5 pg/mL — ABNORMAL LOW (ref 7.2–24.0)
Testosterone: 115 ng/dL — ABNORMAL LOW (ref 264–916)

## 2023-09-07 ENCOUNTER — Ambulatory Visit: Payer: 59 | Admitting: Physician Assistant

## 2023-09-07 ENCOUNTER — Encounter: Payer: Self-pay | Admitting: Physician Assistant

## 2023-09-07 VITALS — BP 146/87 | Temp 98.2°F | Resp 14 | Ht 73.0 in | Wt 365.0 lb

## 2023-09-07 DIAGNOSIS — K219 Gastro-esophageal reflux disease without esophagitis: Secondary | ICD-10-CM

## 2023-09-07 DIAGNOSIS — Z0000 Encounter for general adult medical examination without abnormal findings: Secondary | ICD-10-CM

## 2023-09-07 DIAGNOSIS — E291 Testicular hypofunction: Secondary | ICD-10-CM

## 2023-09-07 MED ORDER — TESTOSTERONE CYPIONATE 200 MG/ML IM SOLN
100.0000 mg | INTRAMUSCULAR | Status: AC
Start: 2023-09-07 — End: 2024-02-22
  Administered 2023-09-03 – 2024-01-27 (×3): 100 mg via INTRAMUSCULAR

## 2023-09-07 NOTE — Progress Notes (Signed)
 [facility] ____________________________________________   None    (approximate)  I have reviewed the triage vital signs and the nursing notes.   HISTORY  Chief Complaint No chief complaint on file.   HPI Jeffrey Carlson is a 53 y.o. male patient presents for annual physical exam.  Complaining of increasing only mild relief with Nexium.  Patient stated past he has doubled the dosages with mild to moderately status.            Past Medical History:  Diagnosis Date   Adult ADHD    Arthritis    Barrett esophagus    Colon polyps    Degenerative joint disease    GERD (gastroesophageal reflux disease)    Hiatal hernia    Hypertension    dr Dorothey Baseman      Leukocytosis    Obesity    Polycythemia, secondary 12/20/2014   Sleep apnea    cpap   Testosterone deficiency    Undescended right testicle     Patient Active Problem List   Diagnosis Date Noted   DJD of left AC (acromioclavicular) joint 10/20/2022   Rotator cuff tendinitis, left 10/20/2022   Hypophosphatemia 09/26/2019   Iron deficiency anemia 02/15/2019   Hypogonadism in male 02/15/2019   Essential hypertension 02/15/2019   Gastroesophageal reflux disease 02/15/2019   History of colonic polyps 02/15/2019   Attention deficit hyperactivity disorder (ADHD) 02/15/2019   Obesity, Class III, BMI 40-49.9 (morbid obesity) (HCC) 07/19/2018   Osteoarthritis of knee 09/08/2017   Degeneration of lumbosacral intervertebral disc 03/25/2016   Displacement of lumbar intervertebral disc without myelopathy 11/22/2015   Lumbar radiculopathy 10/30/2015   Atypical chest pain 01/18/2015   Bundle branch block, right 01/18/2015   Breath shortness 01/18/2015   Polycythemia, secondary 12/20/2014   Rotator cuff tear 10/10/2013   Backache 12/22/2012    Past Surgical History:  Procedure Laterality Date   APPENDECTOMY     BACK SURGERY     CHOLECYSTECTOMY     COLONOSCOPY  06/2010    + small bowel capsule study   COLONOSCOPY WITH PROPOFOL N/A 04/10/2023   Procedure: COLONOSCOPY WITH PROPOFOL;  Surgeon: Jaynie Collins, DO;  Location: [facility] ENDOSCOPY;  Service: Gastroenterology;  Laterality: N/A;   EYE SURGERY     childhood   KNEE SURGERY     ROTATOR CUFF REPAIR     rt shoulder    Prior to Admission medications   Medication Sig Start Date End Date Taking? Authorizing Provider  amoxicillin (AMOXIL) 875 MG tablet Take 1 tablet (875 mg total) by mouth 2 (two) times daily for 10 days. 09/03/23 09/13/23  Joni Reining, PA-C  amphetamine-dextroamphetamine (ADDERALL XR) 20 MG 24 hr capsule Take 1 capsule (20 mg total) by mouth daily. 08/24/23   Joni Reining, PA-C  clotrimazole-betamethasone (LOTRISONE) cream Apply 1 Application topically 2 (two) times daily. Patient not taking: Reported on 09/03/2023 02/03/23   Felecia Shelling, DPM  cyclobenzaprine (FLEXERIL) 5 MG tablet Take 1 tablet (5 mg total) by mouth 3 (three) times daily as needed for muscle spasms. Patient not taking: Reported on 09/03/2023 11/20/20   Viviano Simas, FNP  econazole nitrate 1 % cream Apply topically 2 (two) times daily. Patient not taking: Reported on 09/03/2023 09/17/21   Joni Reining, PA-C  esomeprazole (NEXIUM) 40 MG capsule Take 40 mg by mouth daily at 12 noon.    [provider]  gabapentin (NEURONTIN) 300 MG capsule Take by mouth. 12/10/21  [provider]  hydrOXYzine (VISTARIL) 25 MG capsule Take 1 capsule (25 mg total) by mouth every 8 (eight) hours as needed. Patient not taking: Reported on 09/03/2023 02/23/23   Joni Reining, PA-C  lisinopril (ZESTRIL) 20 MG tablet TAKE 1 TABLET BY MOUTH EVERY DAY 07/16/23   Joni Reining, PA-C  methylPREDNISolone (MEDROL DOSEPAK) 4 MG TBPK tablet Take Tapered dose as directed 09/03/23   Joni Reining, PA-C  Multiple Vitamins-Minerals (MULTIVITAMIN ADULTS PO) Take 1 tablet by mouth daily at 6 PM.    [provider]  nabumetone  (RELAFEN) 500 MG tablet Take 1 tablet by mouth 2 (two) times daily. 10/20/22 10/20/23  [provider]  orphenadrine (NORFLEX) 100 MG tablet Take 1 tablet (100 mg total) by mouth 2 (two) times daily. Patient not taking: Reported on 09/03/2023 03/11/22   Joni Reining, PA-C  tamsulosin (FLOMAX) 0.4 MG CAPS capsule Take 1 capsule (0.4 mg total) by mouth daily. 10/14/22   Joni Reining, PA-C  terbinafine (LAMISIL) 250 MG tablet Take 1 tablet (250 mg total) by mouth daily. Patient not taking: Reported on 09/03/2023 02/03/23   Felecia Shelling, DPM  testosterone cypionate (DEPOTESTOSTERONE CYPIONATE) 200 MG/ML injection Inject 0.5 mLs (100 mg total) into the muscle every 14 (fourteen) days. 07/17/22   Joni Reining, PA-C  tiZANidine (ZANAFLEX) 4 MG tablet Take by mouth. 05/06/22   [provider]  triamcinolone ointment (KENALOG) 0.1 % Apply 1 Application topically 2 (two) times daily. Patient not taking: Reported on 09/03/2023 03/20/23   Katha Cabal, DO    Allergies Patient has no known allergies.  Family History  Problem Relation Age of Onset   Thyroid cancer Father    Rheum arthritis Mother    Abnormal EKG Mother    Colon cancer Paternal Grandmother     Social History Social History   Tobacco Use   Smoking status: Never   Smokeless tobacco: Former    Types: Engineer, drilling   Vaping status: Never Used  Substance Use Topics   Alcohol use: Yes    Alcohol/week: 0.0 standard drinks of alcohol    Comment: occ   Drug use: No    Review of Systems  Constitutional: No fever/chills Eyes: No visual changes. ENT: No sore throat. Cardiovascular: Denies chest pain. Respiratory: Denies shortness of breath. Gastrointestinal: No abdominal pain.  No nausea, no vomiting.  No diarrhea.  No constipation. Genitourinary: Negative for dysuria. Musculoskeletal: Negative for back pain. Skin: Negative for rash. Neurological: Negative for headaches, focal weakness or  numbness. Psychiatric: ADHD   ____________________________________________   PHYSICAL EXAM:  VITAL SIGNS: BP 146/87BP. 146/87. Data is abnormal. Taken on 09/07/23 1:16 PM  Temp 98.2 F (36.8 C)  Temp Source Temporal  Weight 365 lb (165.6 kg)Weight. 365 lb (165.6 kg). Data is abnormal. Taken on 09/07/23 1:16 PM  Height 6\' 1"  (1.854 m)  Resp 14  SpO2 98 %   Constitutional: Alert and oriented. Well appearing and in no acute distress.  Morbid obesity Eyes: Conjunctivae are normal. PERRL. EOMI. Head: Atraumatic. Nose: No congestion/rhinnorhea. Mouth/Throat: Mucous membranes are moist.  Oropharynx non-erythematous. Neck: No stridor.  No cervical spine tenderness to palpation. Hematological/Lymphatic/Immunilogical: No cervical lymphadenopathy. Cardiovascular: Normal rate, regular rhythm. Grossly normal heart sounds.  Good peripheral circulation. Respiratory: Normal respiratory effort.  No retractions. Lungs CTAB. Gastrointestinal: Soft and nontender. No distention. No abdominal bruits. No CVA tenderness. Genitourinary: Deferred Musculoskeletal: No lower extremity tenderness nor edema.  No joint effusions.  Neurologic:  Normal speech and language. No gross focal neurologic deficits are appreciated. No gait instability. Skin:  Skin is warm, dry and intact. No rash noted.  Lipoma left flank.   Psychiatric: Mood and affect are normal. Speech and behavior are normal.  ____________________________________________   LABS           Component Ref Range & Units (hover) 7 d ago (08/31/23) 1 yr ago (08/11/22) 1 yr ago (09/09/21) 2 yr ago (10/04/20) 3 yr ago (07/18/20) 3 yr ago (07/08/20) 3 yr ago (06/29/20)  Color, UA yellow dark yellow Orange Yellow Yellow R  Yellow R  Clarity, UA clear clear Clear Clear     Glucose, UA Negative Negative Negative Negative     Bilirubin, UA neg neg Negative Negative Negative R  Negative R  Ketones, UA neg neg Negative Negative Negative R  Negative R  Spec  Grav, UA 1.015 1.025 1.025 1.010 1.010 R  1.020 R  Blood, UA neg neg Negative Negative Negative R  Negative R  pH, UA 6.0 6.0 6.0 8.0 7.0 R  6.0 R  Protein, UA Negative Negative Negative Negative Negative R  Negative R  Urobilinogen, UA 0.2 0.2 0.2 0.2     Nitrite, UA neg neg Negative Negative Negative R  Negative R  Leukocytes, UA Negative Negative Negative Negative Negative  Negative  Appearance      CLOUDY Abnormal  R   Odor none        Resulting Agency     [facility]             View All Conversations on this Encounter               Component Ref Range & Units (hover) 7 d ago (08/31/23) 1 yr ago (08/11/22) 1 yr ago (09/10/21) 2 yr ago (10/04/20) 3 yr ago (07/08/20) 3 yr ago (07/08/20) 4 yr ago (09/06/19)  Glucose 90 93 84 60 Low  R 110 High  CM  98 R  Uric Acid 7.9 7.0 CM 7.3 CM 7.5 CM   6.7 CM  Comment:            Therapeutic target for gout patients: <6.0  BUN 17 19 18 13 20  R  16  Creatinine, Ser 1.11 1.08 1.01 1.03 1.13 R  1.04  eGFR 79 83 90 88     BUN/Creatinine Ratio 15 18 18 13   15   Sodium 141 139 139 141 138 R  140  Potassium 4.8 4.6 4.7 5.0 3.9 R  4.5  Chloride 102 102 102 102 106 R  105  Calcium 9.5 9.6 9.5 9.3 9.0 R  9.2  Phosphorus 3.0 3.3 2.4 Low  3.0   2.3 Low   Total Protein 7.0 6.7 6.8 6.8   6.8  Albumin 4.2 4.1 4.2 4.1 R   4.4 R  Globulin, Total 2.8 2.6 2.6 2.7   2.4  Bilirubin Total 0.5 0.5 0.7 0.5   0.6  Alkaline Phosphatase 81 66 71 77   69 R  LDH 252 High  195 225 High  311 High    204  AST 42 High  33 36 41 High    34  ALT 56 High  41 49 High  49 High    41  GGT 29 27 23 25   20   Iron 107 89 88 58   96  Cholesterol, Total 138 120 121 109   123  Triglycerides 134 101  85 80   70  HDL 32 Low  33 Low  33 Low  30 Low    36 Low   VLDL Cholesterol Cal 24 19 17 16   14   LDL Chol Calc (NIH) 82 68 71 63   73  Chol/HDL Ratio 4.3 3.6 CM 3.7 CM 3.6 CM   3.4 CM  Comment:                                   T. Chol/HDL Ratio                                              Men  Women                               1/2 Avg.Risk  3.4    3.3                                   Avg.Risk  5.0    4.4                                2X Avg.Risk  9.6    7.1                                3X Avg.Risk 23.4   11.0  Estimated CHD Risk 0.8 0.6 CM 0.6 CM 0.6 CM   0.5 CM  Comment: The CHD Risk is based on the T. Chol/HDL ratio. Other factors affect CHD Risk such as hypertension, smoking, diabetes, severe obesity, and family history of premature CHD.  TSH 2.010 2.440 2.860 2.540   2.560  T4, Total 10.5 8.9 10.5 10.2   10.0  T3 Uptake Ratio 27 29 29  32   29  Free Thyroxine Index 2.8 2.6 3.0 3.3   2.9  Prostate Specific Ag, Serum 0.3 0.2 CM 0.2 CM 0.3 CM   0.5 CM  Comment: Roche ECLIA methodology. According to the American Urological Association, Serum PSA should decrease and remain at undetectable levels after radical prostatectomy. The AUA defines biochemical recurrence as an initial PSA value 0.2 ng/mL or greater followed by a subsequent confirmatory PSA value 0.2 ng/mL or greater. Values obtained with different assay methods or kits cannot be used interchangeably. Results cannot be interpreted as absolute evidence of the presence or absence of malignant disease.  WBC 8.5 8.7 8.0 10.9 High   16.5 High  R 7.5  RBC 5.76 5.98 High  5.90 High  5.67  5.78 R 5.81 High   Hemoglobin 16.3 16.4 16.2 15.7  15.9 R 16.3  Hematocrit 49.1 48.9 48.2 48.7  48.5 R 48.4  MCV 85 82 82 86  83.9 R 83  MCH 28.3 27.4 27.5 27.7  27.5 R 28.1  MCHC 33.2 33.5 33.6 32.2  32.8 R 33.7  RDW 14.2 14.4 13.8 14.2  14.8 R 13.9  Platelets 244 345 350 335  409 High  R 349  Neutrophils 66 65 67 70   70  Lymphs 23 23 22 19    22  Monocytes 7 7 8 7   6   Eos 3 3 2 2   1   Basos 1 1 1 1    0  Neutrophils Absolute 5.6 5.7 5.4 7.7 High    5.3  Lymphocytes Absolute 1.9 2.0 1.7 2.1   1.6  Monocytes Absolute 0.6 0.6 0.6 0.7   0.4  EOS (ABSOLUTE) 0.3 0.2 0.2 0.2   0.1  Basophils  Absolute 0.1 0.1 0.0 0.1   0.0  Immature Granulocytes 0 1 0 1   1  Immature Grans (Abs) 0.0 0.1 0.0 0.1   0.0             ____________________________________________  EKG  Sinus rhythm at 78 bpm.  Right bundle branch block ____________________________________________    ____________________________________________   INITIAL IMPRESSION / ASSESSMENT AND PLAN As part of my medical decision making, I reviewed the following data within the electronic MEDICAL RECORD NUMBER      No acute findings on physical exam and EKG.  Labs continue to show elevated LFTs.  Patient consulted for endoscopic exam secondary to his GERD.        ____________________________________________   FINAL CLINICAL IMPRESSION  Well exam ED Discharge Orders     None        Note:  This document was prepared using Dragon voice recognition software and may include unintentional dictation errors.

## 2023-09-07 NOTE — Addendum Note (Signed)
 Addended by: Gardner Candle on: 09/07/2023 02:18 PM   Modules accepted: Orders

## 2023-09-07 NOTE — Addendum Note (Signed)
 Addended by: Hansel Feinstein on: 09/07/2023 02:13 PM   Modules accepted: Orders

## 2023-09-07 NOTE — Progress Notes (Signed)
 Pt presents today to complete physical, Pt will explain concerns when completing physical.

## 2023-09-14 DIAGNOSIS — K219 Gastro-esophageal reflux disease without esophagitis: Secondary | ICD-10-CM | POA: Diagnosis not present

## 2023-09-29 ENCOUNTER — Other Ambulatory Visit: Payer: Self-pay

## 2023-09-29 DIAGNOSIS — F988 Other specified behavioral and emotional disorders with onset usually occurring in childhood and adolescence: Secondary | ICD-10-CM

## 2023-09-29 MED ORDER — AMPHETAMINE-DEXTROAMPHET ER 20 MG PO CP24: 20.0000 mg | ORAL_CAPSULE | Freq: Every day | ORAL | 0 refills | Status: DC

## 2023-10-01 ENCOUNTER — Ambulatory Visit

## 2023-10-01 DIAGNOSIS — E291 Testicular hypofunction: Secondary | ICD-10-CM

## 2023-10-01 NOTE — Progress Notes (Signed)
 Pt received biweekly injection. Left GLUTE.

## 2023-10-13 ENCOUNTER — Other Ambulatory Visit: Payer: Self-pay | Admitting: Physician Assistant

## 2023-10-13 DIAGNOSIS — R3915 Urgency of urination: Secondary | ICD-10-CM

## 2023-11-04 ENCOUNTER — Other Ambulatory Visit: Payer: Self-pay

## 2023-11-04 DIAGNOSIS — F988 Other specified behavioral and emotional disorders with onset usually occurring in childhood and adolescence: Secondary | ICD-10-CM

## 2023-11-04 MED ORDER — AMPHETAMINE-DEXTROAMPHET ER 20 MG PO CP24
20.0000 mg | ORAL_CAPSULE | Freq: Every day | ORAL | 0 refills | Status: DC
Start: 2023-11-04 — End: 2023-12-08

## 2023-11-17 ENCOUNTER — Encounter: Payer: Self-pay | Admitting: Physician Assistant

## 2023-11-17 ENCOUNTER — Ambulatory Visit: Payer: Self-pay | Admitting: Physician Assistant

## 2023-11-17 DIAGNOSIS — J0100 Acute maxillary sinusitis, unspecified: Secondary | ICD-10-CM

## 2023-11-17 MED ORDER — METHYLPREDNISOLONE SODIUM SUCC 40 MG IJ SOLR
40.0000 mg | Freq: Once | INTRAMUSCULAR | Status: AC
Start: 2023-11-17 — End: 2023-11-17
  Administered 2023-11-17: 40 mg via INTRAMUSCULAR

## 2023-11-17 MED ORDER — SULFAMETHOXAZOLE-TRIMETHOPRIM 800-160 MG PO TABS: 1.0000 | ORAL_TABLET | Freq: Two times a day (BID) | ORAL | 0 refills | Status: DC

## 2023-11-17 MED ORDER — FEXOFENADINE-PSEUDOEPHED ER 60-120 MG PO TB12: 1.0000 | ORAL_TABLET | Freq: Two times a day (BID) | ORAL | 0 refills | Status: AC

## 2023-11-17 MED ORDER — METHYLPREDNISOLONE 4 MG PO TBPK: ORAL_TABLET | ORAL | 0 refills | Status: DC

## 2023-11-17 NOTE — Progress Notes (Signed)
   Subjective: Sinus congestion    Patient ID: Jeffrey Carlson, male    DOB: 01-31-1971, 52 y.o.   MRN: [id number]  HPI Patient complaining of sinus congestion for the past week.  Stated condition worse in the past 2 days.  Denies fever/chills with complaint.  No recent travel or known contact with COVID-19.   Review of Systems  GERD,Hypertension, hypogonadism, and obesity.    Objective:   Physical Exam HEENT is remarkable for edematous nasal turbinates and right edematous TM.  Moderate guarding palpation of frontal and maxillary sinuses.  Copious postnasal drainage. Neck is supple with full lymphadenopathy or bruits. Lungs are clear to auscultation. Heart regular rate and rhythm.       Assessment & Plan: Subacute maxillary sinusitis  Patient given a prescription for Medrol  Dosepak, Allegra-D, and Bactrim  DS.  Advised to follow-up in 1 week if not proving or worsening complaint.

## 2023-11-17 NOTE — Progress Notes (Signed)
 Pt presents today for bad sinus headache and congestion x 4 days. No meds helps he states.

## 2023-12-01 ENCOUNTER — Other Ambulatory Visit: Payer: Self-pay

## 2023-12-01 DIAGNOSIS — M545 Low back pain, unspecified: Secondary | ICD-10-CM

## 2023-12-01 MED ORDER — METHYLPREDNISOLONE 4 MG PO TBPK
ORAL_TABLET | ORAL | 0 refills | Status: DC
Start: 2023-12-01 — End: 2024-05-09

## 2023-12-01 NOTE — Telephone Encounter (Signed)
 Mark called requesting prednisone  for back pain - low back pain started Sunday after he was on his knees putting down flooring all weekend. States pain is where it always is - low back near belt line Reports pain is not radiating down his leg like it's done in the past. Rates pain a 5 on scale of 0-10 Currently taking Gabapentin bid, muscle relaxer (5 mg) & anti-inflammatory. Uses [facility] in [place]

## 2023-12-08 ENCOUNTER — Other Ambulatory Visit: Payer: Self-pay

## 2023-12-08 DIAGNOSIS — F988 Other specified behavioral and emotional disorders with onset usually occurring in childhood and adolescence: Secondary | ICD-10-CM

## 2023-12-08 MED ORDER — AMPHETAMINE-DEXTROAMPHET ER 20 MG PO CP24: 20.0000 mg | ORAL_CAPSULE | Freq: Every day | ORAL | 0 refills | Status: DC

## 2023-12-28 DIAGNOSIS — M5412 Radiculopathy, cervical region: Secondary | ICD-10-CM | POA: Diagnosis not present

## 2023-12-28 DIAGNOSIS — M9902 Segmental and somatic dysfunction of thoracic region: Secondary | ICD-10-CM | POA: Diagnosis not present

## 2023-12-28 DIAGNOSIS — M542 Cervicalgia: Secondary | ICD-10-CM | POA: Diagnosis not present

## 2023-12-28 DIAGNOSIS — M9901 Segmental and somatic dysfunction of cervical region: Secondary | ICD-10-CM | POA: Diagnosis not present

## 2023-12-30 DIAGNOSIS — M9904 Segmental and somatic dysfunction of sacral region: Secondary | ICD-10-CM | POA: Diagnosis not present

## 2023-12-30 DIAGNOSIS — M5417 Radiculopathy, lumbosacral region: Secondary | ICD-10-CM | POA: Diagnosis not present

## 2023-12-30 DIAGNOSIS — M9905 Segmental and somatic dysfunction of pelvic region: Secondary | ICD-10-CM | POA: Diagnosis not present

## 2023-12-30 DIAGNOSIS — M5418 Radiculopathy, sacral and sacrococcygeal region: Secondary | ICD-10-CM | POA: Diagnosis not present

## 2024-01-04 DIAGNOSIS — M5418 Radiculopathy, sacral and sacrococcygeal region: Secondary | ICD-10-CM | POA: Diagnosis not present

## 2024-01-04 DIAGNOSIS — M5417 Radiculopathy, lumbosacral region: Secondary | ICD-10-CM | POA: Diagnosis not present

## 2024-01-04 DIAGNOSIS — M9904 Segmental and somatic dysfunction of sacral region: Secondary | ICD-10-CM | POA: Diagnosis not present

## 2024-01-04 DIAGNOSIS — M9905 Segmental and somatic dysfunction of pelvic region: Secondary | ICD-10-CM | POA: Diagnosis not present

## 2024-01-11 DIAGNOSIS — M5418 Radiculopathy, sacral and sacrococcygeal region: Secondary | ICD-10-CM | POA: Diagnosis not present

## 2024-01-11 DIAGNOSIS — M9904 Segmental and somatic dysfunction of sacral region: Secondary | ICD-10-CM | POA: Diagnosis not present

## 2024-01-11 DIAGNOSIS — M9905 Segmental and somatic dysfunction of pelvic region: Secondary | ICD-10-CM | POA: Diagnosis not present

## 2024-01-11 DIAGNOSIS — M5417 Radiculopathy, lumbosacral region: Secondary | ICD-10-CM | POA: Diagnosis not present

## 2024-01-18 ENCOUNTER — Other Ambulatory Visit: Payer: Self-pay

## 2024-01-18 DIAGNOSIS — F988 Other specified behavioral and emotional disorders with onset usually occurring in childhood and adolescence: Secondary | ICD-10-CM

## 2024-01-18 DIAGNOSIS — M51372 Other intervertebral disc degeneration, lumbosacral region with discogenic back pain and lower extremity pain: Secondary | ICD-10-CM

## 2024-01-18 DIAGNOSIS — M170 Bilateral primary osteoarthritis of knee: Secondary | ICD-10-CM

## 2024-01-18 DIAGNOSIS — M5416 Radiculopathy, lumbar region: Secondary | ICD-10-CM

## 2024-01-19 MED ORDER — GABAPENTIN 300 MG PO CAPS: 300.0000 mg | ORAL_CAPSULE | Freq: Three times a day (TID) | ORAL | 1 refills | Status: DC

## 2024-01-19 MED ORDER — AMPHETAMINE-DEXTROAMPHET ER 20 MG PO CP24: 20.0000 mg | ORAL_CAPSULE | Freq: Every day | ORAL | 0 refills | Status: DC

## 2024-01-27 ENCOUNTER — Ambulatory Visit: Payer: Self-pay

## 2024-01-27 DIAGNOSIS — E291 Testicular hypofunction: Secondary | ICD-10-CM

## 2024-01-28 DIAGNOSIS — M5416 Radiculopathy, lumbar region: Secondary | ICD-10-CM | POA: Diagnosis not present

## 2024-01-28 DIAGNOSIS — Z6841 Body Mass Index (BMI) 40.0 and over, adult: Secondary | ICD-10-CM | POA: Diagnosis not present

## 2024-02-29 ENCOUNTER — Other Ambulatory Visit: Payer: Self-pay

## 2024-02-29 DIAGNOSIS — F988 Other specified behavioral and emotional disorders with onset usually occurring in childhood and adolescence: Secondary | ICD-10-CM

## 2024-02-29 MED ORDER — AMPHETAMINE-DEXTROAMPHET ER 20 MG PO CP24: 20.0000 mg | ORAL_CAPSULE | Freq: Every day | ORAL | 0 refills | Status: DC

## 2024-03-03 ENCOUNTER — Ambulatory Visit: Payer: Self-pay

## 2024-03-03 DIAGNOSIS — E291 Testicular hypofunction: Secondary | ICD-10-CM

## 2024-03-03 MED ORDER — TESTOSTERONE CYPIONATE 100 MG/ML IM SOLN: 50.0000 mg | INTRAMUSCULAR | Status: DC

## 2024-03-03 MED ORDER — TESTOSTERONE CYPIONATE 200 MG/ML IM SOLN
100.0000 mg | INTRAMUSCULAR | Status: AC
  Administered 2024-03-03 – 2024-05-20 (×4): 100 mg via INTRAMUSCULAR

## 2024-03-03 NOTE — Progress Notes (Signed)
Pt received bi weekly testosterone injection.  

## 2024-04-04 ENCOUNTER — Other Ambulatory Visit: Payer: Self-pay

## 2024-04-04 DIAGNOSIS — F988 Other specified behavioral and emotional disorders with onset usually occurring in childhood and adolescence: Secondary | ICD-10-CM

## 2024-04-04 MED ORDER — AMPHETAMINE-DEXTROAMPHET ER 20 MG PO CP24: 20.0000 mg | ORAL_CAPSULE | Freq: Every day | ORAL | 0 refills | Status: DC

## 2024-04-05 ENCOUNTER — Ambulatory Visit

## 2024-04-05 DIAGNOSIS — E291 Testicular hypofunction: Secondary | ICD-10-CM

## 2024-04-05 NOTE — Progress Notes (Signed)
Pt received bi weekly testosterone injection.  

## 2024-04-07 DIAGNOSIS — K219 Gastro-esophageal reflux disease without esophagitis: Secondary | ICD-10-CM | POA: Diagnosis not present

## 2024-05-09 ENCOUNTER — Ambulatory Visit: Payer: Self-pay | Admitting: Physician Assistant

## 2024-05-09 ENCOUNTER — Other Ambulatory Visit: Payer: Self-pay

## 2024-05-09 ENCOUNTER — Encounter: Payer: Self-pay | Admitting: Physician Assistant

## 2024-05-09 VITALS — BP 130/82 | HR 82 | Temp 97.7°F | Resp 12

## 2024-05-09 DIAGNOSIS — F988 Other specified behavioral and emotional disorders with onset usually occurring in childhood and adolescence: Secondary | ICD-10-CM

## 2024-05-09 DIAGNOSIS — Z760 Encounter for issue of repeat prescription: Secondary | ICD-10-CM

## 2024-05-09 MED ORDER — NABUMETONE 500 MG PO TABS: 500.0000 mg | ORAL_TABLET | Freq: Two times a day (BID) | ORAL | 1 refills | Status: AC

## 2024-05-09 MED ORDER — AMPHETAMINE-DEXTROAMPHET ER 20 MG PO CP24: 20.0000 mg | ORAL_CAPSULE | Freq: Every day | ORAL | 0 refills | Status: DC

## 2024-05-09 NOTE — Progress Notes (Signed)
   Subjective: Medication refill    Patient ID: Jeffrey Carlson, male    DOB: Feb 25, 1971, 53 y.o.   MRN: [id number]  HPI Patient requests refill for Relafen.  History of osteoarthritis lumbar spine and left shoulder.   Review of Systems ADHD, GERD, hypogonadism, hypertension, and osteoarthritis.    Objective:   Physical Exam BP 130/82  BP Location Left Arm  Patient Position Sitting  Cuff Size Large  Pulse Rate 82  Temp 97.7 F (36.5 C)  Temp Source Temporal  Resp 12  SpO2 96 %         Assessment & Plan: Medication refill   Prescription refill for Relafen sent to pharmacy.

## 2024-05-09 NOTE — Progress Notes (Signed)
 Requesting Rx refill for Nabumetone (Relafen) 500 mg originally prescribed by Dr. Verlinda [facility]).

## 2024-05-12 DIAGNOSIS — M5416 Radiculopathy, lumbar region: Secondary | ICD-10-CM | POA: Diagnosis not present

## 2024-05-14 ENCOUNTER — Other Ambulatory Visit: Payer: Self-pay

## 2024-05-14 ENCOUNTER — Emergency Department
Admission: EM | Admit: 2024-05-14 | Discharge: 2024-05-14 | Disposition: A | Discharge status: Home / Self Care | Attending: Emergency Medicine | Admitting: Emergency Medicine

## 2024-05-14 DIAGNOSIS — I10 Essential (primary) hypertension: Secondary | ICD-10-CM | POA: Insufficient documentation

## 2024-05-14 DIAGNOSIS — S61210A Laceration without foreign body of right index finger without damage to nail, initial encounter: Secondary | ICD-10-CM | POA: Insufficient documentation

## 2024-05-14 DIAGNOSIS — S61214A Laceration without foreign body of right ring finger without damage to nail, initial encounter: Secondary | ICD-10-CM | POA: Diagnosis not present

## 2024-05-14 DIAGNOSIS — Z23 Encounter for immunization: Secondary | ICD-10-CM | POA: Diagnosis not present

## 2024-05-14 DIAGNOSIS — W311XXA Contact with metalworking machines, initial encounter: Secondary | ICD-10-CM | POA: Insufficient documentation

## 2024-05-14 MED ORDER — TETANUS-DIPHTH-ACELL PERTUSSIS 5-2-15.5 LF-MCG/0.5 IM SUSP
0.5000 mL | Freq: Once | INTRAMUSCULAR | Status: AC
  Administered 2024-05-14: 0.5 mL via INTRAMUSCULAR
  Filled 2024-05-14: qty 0.5

## 2024-05-14 MED ORDER — LIDOCAINE HCL (PF) 1 % IJ SOLN
5.0000 mL | Freq: Once | INTRAMUSCULAR | Status: AC
  Administered 2024-05-14: 5 mL
  Filled 2024-05-14: qty 5

## 2024-05-14 NOTE — Discharge Instructions (Addendum)
 You have been diagnosed with laceration of the right index finger.  Please avoid submerging your finger in water to prevent infection.  Please go to your PCP for suture removal in 6 days.  Please come back to ED or go to your PCP if you have new symptoms symptoms worsen..  It was a pleasure to help you today.  Tiaja Hagan PA-C

## 2024-05-14 NOTE — ED Triage Notes (Signed)
 Pt to ED for R ring finger injury/laceration from metal drill bit today. Area is oozing blood, placed pressure bandage. Unsure when last Tdap.

## 2024-05-14 NOTE — ED Notes (Signed)
 Pt completely assessed by EDP and set to discharge. No RN assessment performed.  Pt provided discharge instructions and prescription information. Pt was given the opportunity to ask questions and questions were answered.

## 2024-05-14 NOTE — ED Provider Notes (Signed)
 [facility] Provider Note    Event Date/Time   First MD Initiated Contact with Patient 05/14/24 1829     (approximate)   History   Finger Injury    HPI  Jeffrey Carlson is a 53 y.o. male    with a past medical history of ADHD, lumbar radiculopathy, lower back pain, atypical chest pain, who presents to the ED complaining of finger injury. According to the patient, he cut his right fourth finger with a metal drill.  Unknown last Tdap.  He is here by himself.     Patient Active Problem List   Diagnosis Date Noted   DJD of left AC (acromioclavicular) joint 10/20/2022   Rotator cuff tendinitis, left 10/20/2022   Hypophosphatemia 09/26/2019   Iron deficiency anemia 02/15/2019   Hypogonadism in male 02/15/2019   Essential hypertension 02/15/2019   Gastroesophageal reflux disease 02/15/2019   History of colonic polyps 02/15/2019   Attention deficit hyperactivity disorder (ADHD) 02/15/2019   Obesity, Class III, BMI 40-49.9 (morbid obesity) (HCC) 07/19/2018   Osteoarthritis of knee 09/08/2017   Degeneration of lumbosacral intervertebral disc 03/25/2016   Displacement of lumbar intervertebral disc without myelopathy 11/22/2015   Lumbar radiculopathy 10/30/2015   Atypical chest pain 01/18/2015   Bundle branch block, right 01/18/2015   Breath shortness 01/18/2015   Polycythemia, secondary 12/20/2014   Rotator cuff tear 10/10/2013   Backache 12/22/2012     Physical Exam   Triage Vital Signs: ED Triage Vitals  Encounter Vitals Group     BP 05/14/24 1726 (!) 141/91     Girls Systolic BP Percentile --      Girls Diastolic BP Percentile --      Boys Systolic BP Percentile --      Boys Diastolic BP Percentile --      Pulse Rate 05/14/24 1726 (!) 105     Resp 05/14/24 1726 16     Temp 05/14/24 1726 98.4 F (36.9 C)     Temp Source 05/14/24 1726 Oral     SpO2 05/14/24 1726 98 %     Weight 05/14/24 1727 (!) 375 lb (170.1 kg)     Height 05/14/24 1727  6' 1 (1.854 m)     Head Circumference --      Peak Flow --      Pain Score 05/14/24 1726 4     Pain Loc --      Pain Education --      Exclude from Growth Chart --     Most recent vital signs: Vitals:   05/14/24 1726  BP: (!) 141/91  Pulse: (!) 105  Resp: 16  Temp: 98.4 F (36.9 C)  SpO2: 98%     Physical Exam Vitals and nursing note reviewed.  During triage patient was hypertensive and tachycardic  General:          Awake, no distress.  CV:                  Good peripheral perfusion.  Resp:               Normal effort. no tachypnea Abd:                 No distention.  Soft nontender Other:              Right fourth finger: Presence of laceration in the volar side of the distal phalange.  Avulsion of the skin, mild active bleeding.  Nail is intact,  full ROM.  Sensation is intact. ED Results / Procedures / Treatments   Labs (all labs ordered are listed, but only abnormal results are displayed) Labs Reviewed - No data to display  PROCEDURES:  Critical Care performed:   .Laceration Repair  Date/Time: 05/14/2024 8:01 PM  Performed by: Janit Kast, PA-C Authorized by: Janit Kast, PA-C   Consent:    Consent obtained:  Verbal   Consent given by:  Patient   Risks, benefits, and alternatives were discussed: yes     Risks discussed:  Pain and infection Universal protocol:    Procedure explained and questions answered to patient or proxy's satisfaction: yes     Patient identity confirmed:  Verbally with patient Anesthesia:    Anesthesia method:  Local infiltration and nerve block   Local anesthetic:  Lidocaine  1% WITH epi   Block needle gauge:  27 G   Block anesthetic:  Lidocaine  1% WITH epi   Block injection procedure:  Anatomic landmarks identified, introduced needle and anatomic landmarks palpated   Block outcome:  Incomplete block Laceration details:    Location:  Finger   Finger location:  R ring finger   Length (cm):  1   Depth (mm):   2 Pre-procedure details:    Preparation:  Patient was prepped and draped in usual sterile fashion Exploration:    Limited defect created (wound extended): no     Hemostasis achieved with:  Direct pressure   Contaminated: no   Treatment:    Area cleansed with:  Povidone-iodine   Amount of cleaning:  Standard   Irrigation solution:  Sterile saline   Irrigation volume:  200   Irrigation method:  Pressure wash   Visualized foreign bodies/material removed: no     Debridement:  None   Undermining:  None Skin repair:    Repair method:  Sutures   Suture size:  5-0   Suture material:  Nylon   Number of sutures:  3 Approximation:    Approximation:  Close Repair type:    Repair type:  Simple Post-procedure details:    Dressing:  Adhesive bandage, bulky dressing and non-adherent dressing   Procedure completion:  Tolerated well, no immediate complications    MEDICATIONS ORDERED IN ED: Medications  Tdap (ADACEL) injection 0.5 mL (0.5 mLs Intramuscular Given 05/14/24 1925)  lidocaine  (PF) (XYLOCAINE ) 1 % injection 5 mL (5 mLs Other Given by Other 05/14/24 1925)      IMPRESSION / MDM / ASSESSMENT AND PLAN / ED COURSE  I reviewed the triage vital signs and the nursing notes.  Differential diagnosis includes, but is not limited to, serration of right fourth finger, avulsion of the skin, unlikely foreign body  Patient's presentation is most consistent with acute, uncomplicated illness.   Jeffrey Carlson is a 53 y.o., male presents today with history of laceration of fourth finger while using a metal drill.  On physical exam presence of laceration in the right fourth finger in the volar side of the distal phalange.  Length 0.5 cm the 2 mm, mild active bleeding.  Nail is intact, full ROM.  Rest of physical exam is normal. Plan Laceration repair Tdap Patient's diagnosis is consistent with laceration of the right fourth finger.  I did not order any imaging or labs, physical exam is  reassuring. I did review the patient's allergies and medications.The patient is in stable and satisfactory condition for discharge home  Patient will be discharged home without prescriptions.  I did advise patient to take ibuprofen  every 8 hours as needed for pain.  Patient is to follow up with PCP or urgent care for suture removal in 6 days as needed or otherwise directed. Patient is given ED precautions to return to the ED for any worsening or new symptoms. Discussed plan of care with patient, answered all of patient's questions, Patient agreeable to plan of care. Advised patient to take medications according to the instructions on the label. Discussed possible side effects of new medications. Patient verbalized understanding.  FINAL CLINICAL IMPRESSION(S) / ED DIAGNOSES   Final diagnoses:  Laceration of right index finger without foreign body without damage to nail, initial encounter     Rx / DC Orders   ED Discharge Orders     None        Note:  This document was prepared using Dragon voice recognition software and may include unintentional dictation errors.   Janit Kast, PA-C 05/14/24 Jeffrey Willo Dunnings, MD 05/15/24 1755

## 2024-05-17 ENCOUNTER — Ambulatory Visit

## 2024-05-17 DIAGNOSIS — S61214D Laceration without foreign body of right ring finger without damage to nail, subsequent encounter: Secondary | ICD-10-CM

## 2024-05-17 NOTE — Progress Notes (Signed)
 Went to ED over the weekend for laceration to right 4th finger sustained while working on building at home with a drill bit.  Presents requesting dressing change to right 4th finger laceration.  Old adhesive bandage removed Cleansed with 1/2 strength H2O2 Sutures intact Partial laceration area where skin came off is scabbed No signs of infectio Vaseline bandage applied & covered with adhesive bandage Extra supplies given to change bandage  States he will come by Wednesday & let us  recheck

## 2024-05-20 ENCOUNTER — Ambulatory Visit: Payer: Self-pay

## 2024-05-20 DIAGNOSIS — E291 Testicular hypofunction: Secondary | ICD-10-CM

## 2024-05-20 NOTE — Progress Notes (Signed)
 Sutures x3 removed from right 4th finger after soaking in normal saline.  Antibiotic ointment applied along with waterproof adhesive bandage.  No S/Sx of infection present.

## 2024-05-23 ENCOUNTER — Ambulatory Visit: Payer: Self-pay

## 2024-06-06 ENCOUNTER — Ambulatory Visit: Payer: Self-pay

## 2024-06-06 DIAGNOSIS — E291 Testicular hypofunction: Secondary | ICD-10-CM

## 2024-06-06 MED ORDER — TESTOSTERONE CYPIONATE 200 MG/ML IM SOLN
100.0000 mg | INTRAMUSCULAR | Status: AC
  Administered 2024-06-06 – 2024-07-05 (×2): 100 mg via INTRAMUSCULAR

## 2024-06-20 ENCOUNTER — Other Ambulatory Visit: Payer: Self-pay

## 2024-06-20 DIAGNOSIS — F988 Other specified behavioral and emotional disorders with onset usually occurring in childhood and adolescence: Secondary | ICD-10-CM

## 2024-06-20 MED ORDER — AMPHETAMINE-DEXTROAMPHET ER 20 MG PO CP24: 20.0000 mg | ORAL_CAPSULE | Freq: Every day | ORAL | 0 refills | Status: DC

## 2024-07-05 ENCOUNTER — Ambulatory Visit

## 2024-07-05 DIAGNOSIS — E291 Testicular hypofunction: Secondary | ICD-10-CM

## 2024-07-21 ENCOUNTER — Other Ambulatory Visit: Payer: Self-pay | Admitting: Physician Assistant

## 2024-07-21 DIAGNOSIS — M170 Bilateral primary osteoarthritis of knee: Secondary | ICD-10-CM

## 2024-07-21 DIAGNOSIS — I10 Essential (primary) hypertension: Secondary | ICD-10-CM

## 2024-07-21 DIAGNOSIS — M51372 Other intervertebral disc degeneration, lumbosacral region with discogenic back pain and lower extremity pain: Secondary | ICD-10-CM

## 2024-07-21 DIAGNOSIS — M5416 Radiculopathy, lumbar region: Secondary | ICD-10-CM

## 2024-07-29 ENCOUNTER — Ambulatory Visit: Payer: Self-pay

## 2024-07-29 ENCOUNTER — Other Ambulatory Visit: Payer: Self-pay

## 2024-07-29 DIAGNOSIS — E291 Testicular hypofunction: Secondary | ICD-10-CM

## 2024-07-29 DIAGNOSIS — F988 Other specified behavioral and emotional disorders with onset usually occurring in childhood and adolescence: Secondary | ICD-10-CM

## 2024-07-30 MED ORDER — AMPHETAMINE-DEXTROAMPHET ER 20 MG PO CP24: 20.0000 mg | ORAL_CAPSULE | Freq: Every day | ORAL | 0 refills | Status: AC

## 2024-08-04 ENCOUNTER — Ambulatory Visit: Payer: Self-pay

## 2024-08-04 DIAGNOSIS — Z0000 Encounter for general adult medical examination without abnormal findings: Secondary | ICD-10-CM

## 2024-08-04 DIAGNOSIS — E291 Testicular hypofunction: Secondary | ICD-10-CM

## 2024-08-04 DIAGNOSIS — R5383 Other fatigue: Secondary | ICD-10-CM

## 2024-08-04 LAB — POCT URINALYSIS DIPSTICK
Bilirubin, UA: NEGATIVE
Blood, UA: NEGATIVE
Glucose, UA: NEGATIVE
Ketones, UA: NEGATIVE
Leukocytes, UA: NEGATIVE
Nitrite, UA: NEGATIVE
Protein, UA: NEGATIVE
Spec Grav, UA: 1.01
Urobilinogen, UA: 0.2 U/dL
pH, UA: 7.5

## 2024-08-05 LAB — TESTOSTERONE: Testosterone: 724 ng/dL (ref 264–916)

## 2024-08-05 LAB — CMP12+LP+TP+TSH+6AC+PSA+CBC…
ALT: 67 IU/L — ABNORMAL HIGH (ref 0–44)
AST: 48 IU/L — ABNORMAL HIGH (ref 0–40)
Albumin: 4.3 g/dL (ref 3.8–4.9)
Alkaline Phosphatase: 80 IU/L (ref 47–123)
BUN/Creatinine Ratio: 14 (ref 9–20)
BUN: 11 mg/dL (ref 6–24)
Basophils Absolute: 0.1 x10E3/uL (ref 0.0–0.2)
Basos: 1 %
Bilirubin Total: 0.6 mg/dL (ref 0.0–1.2)
Calcium: 9.6 mg/dL (ref 8.7–10.2)
Chloride: 101 mmol/L (ref 96–106)
Chol/HDL Ratio: 4.1 ratio (ref 0.0–5.0)
Cholesterol, Total: 119 mg/dL (ref 100–199)
Creatinine, Ser: 0.81 mg/dL (ref 0.76–1.27)
EOS (ABSOLUTE): 0.3 x10E3/uL (ref 0.0–0.4)
Eos: 2 %
Estimated CHD Risk: 0.8 times avg. (ref 0.0–1.0)
Free Thyroxine Index: 2.6 (ref 1.2–4.9)
GGT: 29 IU/L (ref 0–65)
Globulin, Total: 2.7 g/dL (ref 1.5–4.5)
Glucose: 84 mg/dL (ref 70–99)
HDL: 29 mg/dL — ABNORMAL LOW
Hematocrit: 51.5 % — ABNORMAL HIGH (ref 37.5–51.0)
Hemoglobin: 17.1 g/dL (ref 13.0–17.7)
Immature Grans (Abs): 0.1 x10E3/uL (ref 0.0–0.1)
Immature Granulocytes: 1 %
Iron: 81 ug/dL (ref 38–169)
LDH: 300 IU/L — ABNORMAL HIGH (ref 121–224)
LDL Chol Calc (NIH): 66 mg/dL (ref 0–99)
Lymphocytes Absolute: 2.6 x10E3/uL (ref 0.7–3.1)
Lymphs: 23 %
MCH: 27.8 pg (ref 26.6–33.0)
MCHC: 33.2 g/dL (ref 31.5–35.7)
MCV: 84 fL (ref 79–97)
Monocytes Absolute: 0.8 x10E3/uL (ref 0.1–0.9)
Monocytes: 7 %
Neutrophils Absolute: 7.9 x10E3/uL — ABNORMAL HIGH (ref 1.4–7.0)
Neutrophils: 66 %
Phosphorus: 2.9 mg/dL (ref 2.8–4.1)
Platelets: 358 x10E3/uL (ref 150–450)
Potassium: 4.9 mmol/L (ref 3.5–5.2)
Prostate Specific Ag, Serum: 0.3 ng/mL (ref 0.0–4.0)
RBC: 6.16 x10E6/uL — ABNORMAL HIGH (ref 4.14–5.80)
RDW: 15.4 % (ref 11.6–15.4)
Sodium: 143 mmol/L (ref 134–144)
T3 Uptake Ratio: 28 % (ref 24–39)
T4, Total: 9.2 ug/dL (ref 4.5–12.0)
TSH: 2.44 u[IU]/mL (ref 0.450–4.500)
Total Protein: 7 g/dL (ref 6.0–8.5)
Triglycerides: 136 mg/dL (ref 0–149)
Uric Acid: 8 mg/dL (ref 3.8–8.4)
VLDL Cholesterol Cal: 24 mg/dL (ref 5–40)
WBC: 11.7 x10E3/uL — ABNORMAL HIGH (ref 3.4–10.8)
eGFR: 105 mL/min/1.73

## 2024-08-05 LAB — VITAMIN B12: Vitamin B-12: 656 pg/mL (ref 232–1245)

## 2024-08-05 LAB — VITAMIN D 25 HYDROXY (VIT D DEFICIENCY, FRACTURES): Vit D, 25-Hydroxy: 34.5 ng/mL (ref 30.0–100.0)

## 2024-08-11 ENCOUNTER — Encounter: Payer: Self-pay | Admitting: Physician Assistant

## 2024-08-11 ENCOUNTER — Ambulatory Visit: Payer: Self-pay | Admitting: Physician Assistant

## 2024-08-11 VITALS — BP 138/75 | HR 94 | Temp 98.0°F | Resp 16 | Ht 73.0 in | Wt 380.0 lb

## 2024-08-11 DIAGNOSIS — Z0000 Encounter for general adult medical examination without abnormal findings: Secondary | ICD-10-CM

## 2024-08-11 NOTE — Progress Notes (Signed)
 " [facility]  ____________________________________________   None    (approximate)  I have reviewed the triage vital signs and the nursing notes.   HISTORY  Chief Complaint Annual Exam   HPI LONDYN WOTTON is a 54 y.o. 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         Past Medical History:  Diagnosis Date   Adult ADHD    Arthritis    Barrett esophagus    Colon polyps    Degenerative joint disease    GERD (gastroesophageal reflux disease)    Hiatal hernia    Hypertension    dr bonnetta   [place]   Leukocytosis    Obesity    Polycythemia, secondary 12/20/2014   Sleep apnea    cpap   Testosterone  deficiency    Undescended right testicle     Patient Active Problem List   Diagnosis Date Noted   DJD of left AC (acromioclavicular) joint 10/20/2022   Rotator cuff tendinitis, left 10/20/2022   Hypophosphatemia 09/26/2019   Iron deficiency anemia 02/15/2019   Hypogonadism in male 02/15/2019   Essential hypertension 02/15/2019   Gastroesophageal reflux disease 02/15/2019   History of colonic polyps 02/15/2019   Attention deficit hyperactivity disorder (ADHD) 02/15/2019   Obesity, Class III, BMI 40-49.9 (morbid obesity) (HCC) 07/19/2018   Osteoarthritis of knee 09/08/2017   Degeneration of lumbosacral intervertebral disc 03/25/2016   Displacement of lumbar intervertebral disc without myelopathy 11/22/2015   Lumbar radiculopathy 10/30/2015   Atypical chest pain 01/18/2015   Bundle branch block, right 01/18/2015   Breath shortness 01/18/2015   Polycythemia, secondary 12/20/2014   Rotator cuff tear 10/10/2013   Backache 12/22/2012    Past Surgical History:  Procedure Laterality Date   APPENDECTOMY     BACK SURGERY     CHOLECYSTECTOMY     COLONOSCOPY  06/2010   + small bowel capsule study   COLONOSCOPY WITH PROPOFOL  N/A 04/10/2023   Procedure: COLONOSCOPY WITH  PROPOFOL ;  Surgeon: Onita Elspeth Sharper, DO;  Location: [facility];  Service: Gastroenterology;  Laterality: N/A;   EYE SURGERY     childhood   KNEE SURGERY     ROTATOR CUFF REPAIR     rt shoulder    Prior to Admission medications  Medication Sig Start Date End Date Taking? Authorizing Provider  amphetamine -dextroamphetamine (ADDERALL XR) 20 MG 24 hr capsule Take 1 capsule (20 mg total) by mouth daily. 07/30/24   Claudene Tanda POUR, PA-C  Cholecalciferol (VITAMIN D-1000 MAX ST) 25 MCG (1000 UT) tablet Take by mouth.    [provider]  clotrimazole -betamethasone  (LOTRISONE ) cream Apply 1 Application topically 2 (two) times daily. 02/03/23   Janit Thresa CHRISTELLA, DPM  cyclobenzaprine  (FLEXERIL ) 5 MG tablet Take 1 tablet (5 mg total) by mouth 3 (three) times daily as needed for muscle spasms. 11/20/20   Kennyth Domino, FNP  econazole nitrate  1 % cream Apply topically 2 (two) times daily. 09/17/21   Claudene Tanda POUR, PA-C  famotidine (PEPCID) 40 MG tablet Take 40 mg by mouth at bedtime.    [provider]  fexofenadine -pseudoephedrine (ALLEGRA-D) 60-120 MG 12 hr tablet Take 1 tablet by mouth 2 (two) times daily. 11/17/23   Claudene Tanda POUR, PA-C  gabapentin  (NEURONTIN ) 300 MG capsule TAKE 1 CAPSULE BY MOUTH THREE TIMES A DAY 07/21/24   Claudene Tanda POUR, PA-C  hydrOXYzine  (VISTARIL ) 25 MG capsule Take 1 capsule (25 mg total)  by mouth every 8 (eight) hours as needed. 02/23/23   Claudene Tanda POUR, PA-C  lisinopril  (ZESTRIL ) 20 MG tablet TAKE 1 TABLET BY MOUTH EVERY DAY 07/21/24   Claudene Tanda POUR, PA-C  magnesium  oxide (MAG-OX) 400 MG tablet Take 400 mg by mouth.    [provider]  Multiple Vitamins-Minerals (MULTIVITAMIN ADULTS PO) Take 1 tablet by mouth daily at 6 PM.    [provider]  nabumetone  (RELAFEN ) 500 MG tablet Take 1 tablet (500 mg total) by mouth 2 (two) times daily. 05/09/24   Claudene Tanda POUR, PA-C  Omega-3 1000 MG CAPS Take by mouth.    [provider]   orphenadrine  (NORFLEX ) 100 MG tablet Take 1 tablet (100 mg total) by mouth 2 (two) times daily. 03/11/22   Claudene Tanda POUR, PA-C  pantoprazole (PROTONIX) 40 MG tablet Take 40 mg by mouth. 04/07/24 04/07/25  [provider]  tamsulosin  (FLOMAX ) 0.4 MG CAPS capsule TAKE 1 CAPSULE BY MOUTH EVERY DAY 10/13/23   Claudene Tanda POUR, PA-C  testosterone  cypionate (DEPOTESTOSTERONE CYPIONATE) 200 MG/ML injection Inject 0.5 mLs (100 mg total) into the muscle every 14 (fourteen) days. 07/17/22   Claudene Tanda POUR, PA-C  tiZANidine (ZANAFLEX) 4 MG tablet Take by mouth. 05/06/22   [provider]    Allergies Patient has no known allergies.  Family History  Problem Relation Age of Onset   Thyroid cancer Father    Rheum arthritis Mother    Abnormal EKG Mother    Colon cancer Paternal Grandmother     Social History Social History[1]  Review of Systems Constitutional: No fever/chills.  Obesity Eyes: No visual changes. ENT: No sore throat. Cardiovascular: Denies chest pain. Respiratory: Denies shortness of breath. Gastrointestinal: No abdominal pain.  No nausea, no vomiting.  No diarrhea.  No constipation.  GERD Genitourinary: Negative for dysuria.  Hypogonadism Musculoskeletal: Negative for back pain. Skin: Negative for rash. Neurological: Negative for headaches, focal weakness or numbness. Psychiatric: ADD Endocrine: Hypertension Hematological/Lymphatic: Iron deficiency anemia   ____________________________________________   PHYSICAL EXAM:  VITAL SIGNS: BMI: 50.13 kg/m2  BSA: 2.98 m2   BP 138/75  BP Location Left Arm  Patient Position Sitting  Cuff Size Large  Pulse Rate 94  Temp 98 F (36.7 C)  Temp Source Temporal  Weight 380 lb (172.4 kg)  Height 6' 1 (1.854 m)  Resp 16  SpO2 94 %   Constitutional: Alert and oriented. Well appearing and in no acute distress. Eyes: Conjunctivae are normal. PERRL. EOMI. Head: Atraumatic. Nose: No  congestion/rhinnorhea. Mouth/Throat: Mucous membranes are moist.  Oropharynx non-erythematous. Neck: No stridor.  No cervical spine tenderness to palpation. Hematological/Lymphatic/Immunilogical: No cervical lymphadenopathy. Cardiovascular: Normal rate, regular rhythm. Grossly normal heart sounds.  Good peripheral circulation. Respiratory: Normal respiratory effort.  No retractions. Lungs CTAB. Gastrointestinal: Soft and nontender. No distention. No abdominal bruits. No CVA tenderness. Genitourinary: Deferred Musculoskeletal: No lower extremity tenderness nor edema.  No joint effusions. Neurologic:  Normal speech and language. No gross focal neurologic deficits are appreciated. No gait instability. Skin:  Skin is warm, dry and intact. No rash noted. Psychiatric: Mood and affect are normal. Speech and behavior are normal.  ____________________________________________   LABS          Component Ref Range & Units (hover) 7 d ago (08/04/24) 11 mo ago (08/31/23) 2 yr ago (08/11/22) 2 yr ago (09/09/21) 3 yr ago (10/04/20) 4 yr ago (07/18/20) 4 yr ago (07/08/20)  Color, UA yellow yellow dark yellow Orange Yellow  Yellow R   Clarity, UA clear clear clear Clear Clear    Glucose, UA Negative Negative Negative Negative Negative    Bilirubin, UA neg neg neg Negative Negative Negative R   Ketones, UA neg neg neg Negative Negative Negative R   Spec Grav, UA 1.010 1.015 1.025 1.025 1.010 1.010 R   Blood, UA neg neg neg Negative Negative Negative R   pH, UA 7.5 6.0 6.0 6.0 8.0 7.0 R   Protein, UA Negative Negative Negative Negative Negative Negative R   Urobilinogen, UA 0.2 0.2 0.2 0.2 0.2    Nitrite, UA neg neg neg Negative Negative Negative R   Leukocytes, UA Negative Negative Negative Negative Negative Negative   Appearance       CLOUDY Abnormal  R  Odor  none                   _          Component Ref Range & Units (hover) 7 d ago (08/04/24) 11 mo ago (08/31/23) 2 yr ago (08/11/22) 2 yr  ago (09/10/21) 3 yr ago (10/04/20) 4 yr ago (07/08/20) 4 yr ago (07/08/20)  Glucose 84 90 93 84 60 Low  R 110 High  CM   Uric Acid 8.0 7.9 CM 7.0 CM 7.3 CM 7.5 CM    Comment:            Therapeutic target for gout patients: <6.0  BUN 11 17 19 18 13 20  R   Creatinine, Ser 0.81 1.11 1.08 1.01 1.03 1.13 R   eGFR 105 79 83 90 88    BUN/Creatinine Ratio 14 15 18 18 13     Sodium 143 141 139 139 141 138 R   Potassium 4.9 4.8 4.6 4.7 5.0 3.9 R   Chloride 101 102 102 102 102 106 R   Calcium 9.6 9.5 9.6 9.5 9.3 9.0 R   Phosphorus 2.9 3.0 3.3 2.4 Low  3.0    Total Protein 7.0 7.0 6.7 6.8 6.8    Albumin 4.3 4.2 4.1 4.2 4.1 R    Globulin, Total 2.7 2.8 2.6 2.6 2.7    Bilirubin Total 0.6 0.5 0.5 0.7 0.5    Alkaline Phosphatase 80 81 R 66 R 71 R 77 R    LDH 300 High  252 High  195 225 High  311 High     AST 48 High  42 High  33 36 41 High     ALT 67 High  56 High  41 49 High  49 High     GGT 29 29 27 23 25     Iron 81 107 89 88 58    Cholesterol, Total 119 138 120 121 109    Triglycerides 136 134 101 85 80    HDL 29 Low  32 Low  33 Low  33 Low  30 Low     VLDL Cholesterol Cal 24 24 19 17 16     LDL Chol Calc (NIH) 66 82 68 71 63    Chol/HDL Ratio 4.1 4.3 CM 3.6 CM 3.7 CM 3.6 CM    Comment:                                   T. Chol/HDL Ratio  Men  Women                               1/2 Avg.Risk  3.4    3.3                                   Avg.Risk  5.0    4.4                                2X Avg.Risk  9.6    7.1                                3X Avg.Risk 23.4   11.0  Estimated CHD Risk 0.8 0.8 CM 0.6 CM 0.6 CM 0.6 CM    Comment: The CHD Risk is based on the T. Chol/HDL ratio. Other factors affect CHD Risk such as hypertension, smoking, diabetes, severe obesity, and family history of premature CHD.  TSH 2.440 2.010 2.440 2.860 2.540    T4, Total 9.2 10.5 8.9 10.5 10.2    T3 Uptake Ratio 28 27 29 29  32    Free Thyroxine Index 2.6 2.8 2.6 3.0 3.3     Prostate Specific Ag, Serum 0.3 0.3 CM 0.2 CM 0.2 CM 0.3 CM    Comment: Roche ECLIA methodology. According to the American Urological Association, Serum PSA should decrease and remain at undetectable levels after radical prostatectomy. The AUA defines biochemical recurrence as an initial PSA value 0.2 ng/mL or greater followed by a subsequent confirmatory PSA value 0.2 ng/mL or greater. Values obtained with different assay methods or kits cannot be used interchangeably. Results cannot be interpreted as absolute evidence of the presence or absence of malignant disease.  WBC 11.7 High  8.5 8.7 8.0 10.9 High   16.5 High  R  RBC 6.16 High  5.76 5.98 High  5.90 High  5.67  5.78 R  Hemoglobin 17.1 16.3 16.4 16.2 15.7  15.9 R  Hematocrit 51.5 High  49.1 48.9 48.2 48.7  48.5 R  MCV 84 85 82 82 86  83.9 R  MCH 27.8 28.3 27.4 27.5 27.7  27.5 R  MCHC 33.2 33.2 33.5 33.6 32.2  32.8 R  RDW 15.4 14.2 14.4 13.8 14.2  14.8 R  Platelets 358 244 345 350 335  409 High  R  Neutrophils 66 66 65 67 70    Lymphs 23 23 23 22 19     Monocytes 7 7 7 8 7     Eos 2 3 3 2 2     Basos 1 1 1 1 1     Neutrophils Absolute 7.9 High  5.6 5.7 5.4 7.7 High     Lymphocytes Absolute 2.6 1.9 2.0 1.7 2.1    Monocytes Absolute 0.8 0.6 0.6 0.6 0.7    EOS (ABSOLUTE) 0.3 0.3 0.2 0.2 0.2    Basophils Absolute 0.1 0.1 0.1 0.0 0.1    Immature Granulocytes 1 0 1 0 1    Immature Grans (Abs) 0.1 0.0 0.1 0.0 0.1                _          Component Ref Range & Units (hover) 7 d ago (08/04/24) 11 mo ago (08/31/23) 2  yr ago (07/04/22) 2 yr ago (09/10/21) 3 yr ago (12/11/20) 3 yr ago (10/04/20) 4 yr ago (05/11/20)  Testosterone  724 115 Low  CM 596 CM 128 Low  CM 325 CM 487 CM 387 CM  Comment: Adult male reference interval is based on a population of healthy nonobese males (BMI <30) between 74 and 54 years old. Travison, et.al. JCEM [phone]. PMID: [id number].              _    Component Ref Range & Units (hover) 7 d  ago  Vitamin B-12 656        _    Component Ref Range & Units (hover) 7 d ago  Vit D, 25-Hydroxy 34.5  Comment: Vitamin D deficiency has been defined by the Institute of Medicine and an Endocrine Society practice guideline as a level of serum 25-OH vitamin D less than 20 ng/mL (1,2). The Endocrine Society went on to further define vitamin D insufficiency as a level between 21 and 29 ng/mL (2). 1. IOM (Institute of Medicine). 2010. Dietary reference    intakes for calcium and D. [place]: The    [employer]. 2. Holick MF, Binkley Euharlee, Bischoff-Ferrari HA, et al.    Evaluation, treatment, and prevention of vitamin D    deficiency: an Endocrine Society clinical practice    guideline. JCEM. 2011 Jul; 96(7):1911-30.        ________________________________________  EKG  Sinus rhythm at 80 bpm.  Right bundle branch block. ____________________________________________    ____________________________________________   INITIAL IMPRESSION / ASSESSMENT AND PLAN  As part of my medical decision making, I reviewed the following data within the electronic MEDICAL RECORD NUMBER       No acute findings on physical exam and EKG.  Labs reveal elevated LFTs secondary to fatty liver.      ____________________________________________   FINAL CLINICAL IMPRESSION  Well exam ED Discharge Orders     None        Note:  This document was prepared using Dragon voice recognition software and may include unintentional dictation errors.     [1]  Social History Tobacco Use   Smoking status: Never   Smokeless tobacco: Former    Types: Engineer, Drilling   Vaping status: Never Used  Substance Use Topics   Alcohol use: Yes    Alcohol/week: 0.0 standard drinks of alcohol    Comment: occ   Drug use: No   "
